# Patient Record
Sex: Male | Born: 1940 | Race: White | Hispanic: No | Smoking: Never smoker
Health system: Southern US, Community
[De-identification: ages and names within clinical notes are randomized; demographics above are authoritative.]

## PROBLEM LIST (undated history)

## (undated) DIAGNOSIS — I10 Essential (primary) hypertension: Secondary | ICD-10-CM

## (undated) DIAGNOSIS — E049 Nontoxic goiter, unspecified: Secondary | ICD-10-CM

## (undated) DIAGNOSIS — N400 Enlarged prostate without lower urinary tract symptoms: Secondary | ICD-10-CM

## (undated) DIAGNOSIS — N529 Male erectile dysfunction, unspecified: Secondary | ICD-10-CM

## (undated) DIAGNOSIS — L309 Dermatitis, unspecified: Secondary | ICD-10-CM

## (undated) DIAGNOSIS — D696 Thrombocytopenia, unspecified: Secondary | ICD-10-CM

## (undated) DIAGNOSIS — R519 Headache, unspecified: Secondary | ICD-10-CM

## (undated) DIAGNOSIS — H33012 Retinal detachment with single break, left eye: Secondary | ICD-10-CM

## (undated) DIAGNOSIS — K219 Gastro-esophageal reflux disease without esophagitis: Secondary | ICD-10-CM

## (undated) DIAGNOSIS — K5790 Diverticulosis of intestine, part unspecified, without perforation or abscess without bleeding: Secondary | ICD-10-CM

## (undated) DIAGNOSIS — I7090 Unspecified atherosclerosis: Secondary | ICD-10-CM

## (undated) DIAGNOSIS — R7989 Other specified abnormal findings of blood chemistry: Secondary | ICD-10-CM

## (undated) DIAGNOSIS — I739 Peripheral vascular disease, unspecified: Secondary | ICD-10-CM

## (undated) DIAGNOSIS — G250 Essential tremor: Secondary | ICD-10-CM

## (undated) DIAGNOSIS — M1990 Unspecified osteoarthritis, unspecified site: Secondary | ICD-10-CM

## (undated) DIAGNOSIS — E785 Hyperlipidemia, unspecified: Secondary | ICD-10-CM

## (undated) DIAGNOSIS — L570 Actinic keratosis: Secondary | ICD-10-CM

## (undated) DIAGNOSIS — E119 Type 2 diabetes mellitus without complications: Secondary | ICD-10-CM

## (undated) DIAGNOSIS — F419 Anxiety disorder, unspecified: Secondary | ICD-10-CM

## (undated) DIAGNOSIS — H409 Unspecified glaucoma: Secondary | ICD-10-CM

## (undated) HISTORY — DX: Unspecified osteoarthritis, unspecified site: M19.90

## (undated) HISTORY — DX: Thrombocytopenia, unspecified: D69.6

## (undated) HISTORY — DX: Nontoxic goiter, unspecified: E04.9

## (undated) HISTORY — DX: Benign prostatic hyperplasia without lower urinary tract symptoms: N40.0

## (undated) HISTORY — DX: Unspecified glaucoma: H40.9

## (undated) HISTORY — DX: Dermatitis, unspecified: L30.9

## (undated) HISTORY — DX: Hyperlipidemia, unspecified: E78.5

## (undated) HISTORY — DX: Unspecified atherosclerosis: I70.90

## (undated) HISTORY — DX: Diverticulosis of intestine, part unspecified, without perforation or abscess without bleeding: K57.90

## (undated) HISTORY — DX: Gastro-esophageal reflux disease without esophagitis: K21.9

## (undated) HISTORY — DX: Essential tremor: G25.0

## (undated) HISTORY — PX: CATARACT EXTRACTION: SUR2

## (undated) HISTORY — PX: PYLOROMYOTOMY: SUR1063

## (undated) HISTORY — PX: SPINAL FUSION: SHX223

## (undated) HISTORY — PX: HEMORRHOIDECTOMY WITH HEMORRHOID BANDING: SHX5633

## (undated) HISTORY — DX: Essential (primary) hypertension: I10

## (undated) HISTORY — PX: PROSTATE SURGERY: SHX751

## (undated) HISTORY — DX: Other specified abnormal findings of blood chemistry: R79.89

## (undated) HISTORY — DX: Male erectile dysfunction, unspecified: N52.9

## (undated) HISTORY — PX: VASECTOMY: SHX75

## (undated) HISTORY — DX: Actinic keratosis: L57.0

## (undated) HISTORY — DX: Type 2 diabetes mellitus without complications: E11.9

## (undated) HISTORY — PX: BIOPSY THYROID: PRO38

## (undated) HISTORY — PX: BACK SURGERY: SHX140

---

## 1994-09-04 HISTORY — PX: RETINAL DETACHMENT SURGERY: SHX105

## 1995-09-05 HISTORY — PX: CATARACT EXTRACTION: SUR2

## 1997-09-04 HISTORY — PX: OTHER SURGICAL HISTORY: SHX169

## 1997-12-09 ENCOUNTER — Other Ambulatory Visit: Admission: RE | Admit: 1997-12-09 | Discharge: 1997-12-09 | Payer: Self-pay | Admitting: Family Medicine

## 1998-01-13 ENCOUNTER — Ambulatory Visit (HOSPITAL_COMMUNITY): Admission: RE | Admit: 1998-01-13 | Discharge: 1998-01-14 | Payer: Self-pay | Admitting: Neurosurgery

## 1998-01-28 ENCOUNTER — Other Ambulatory Visit: Admission: RE | Admit: 1998-01-28 | Discharge: 1998-01-28 | Payer: Self-pay | Admitting: Family Medicine

## 1998-03-01 ENCOUNTER — Other Ambulatory Visit: Admission: RE | Admit: 1998-03-01 | Discharge: 1998-03-01 | Payer: Self-pay | Admitting: Family Medicine

## 1998-03-03 ENCOUNTER — Ambulatory Visit (HOSPITAL_COMMUNITY): Admission: RE | Admit: 1998-03-03 | Discharge: 1998-03-03 | Payer: Self-pay | Admitting: Neurosurgery

## 1999-09-05 HISTORY — PX: OTHER SURGICAL HISTORY: SHX169

## 2001-09-04 HISTORY — PX: OTHER SURGICAL HISTORY: SHX169

## 2002-09-01 ENCOUNTER — Encounter: Payer: Self-pay | Admitting: Neurosurgery

## 2002-09-03 ENCOUNTER — Inpatient Hospital Stay (HOSPITAL_COMMUNITY): Admission: RE | Admit: 2002-09-03 | Discharge: 2002-09-08 | Payer: Self-pay | Admitting: Neurosurgery

## 2002-09-03 ENCOUNTER — Encounter: Payer: Self-pay | Admitting: Neurosurgery

## 2002-10-02 ENCOUNTER — Encounter: Payer: Self-pay | Admitting: Neurosurgery

## 2002-10-02 ENCOUNTER — Encounter: Admission: RE | Admit: 2002-10-02 | Discharge: 2002-10-02 | Payer: Self-pay | Admitting: Neurosurgery

## 2002-12-09 ENCOUNTER — Encounter: Admission: RE | Admit: 2002-12-09 | Discharge: 2002-12-09 | Payer: Self-pay | Admitting: Neurosurgery

## 2002-12-09 ENCOUNTER — Encounter: Payer: Self-pay | Admitting: Neurosurgery

## 2003-09-05 HISTORY — PX: KNEE SURGERY: SHX244

## 2003-10-05 ENCOUNTER — Inpatient Hospital Stay (HOSPITAL_COMMUNITY): Admission: RE | Admit: 2003-10-05 | Discharge: 2003-10-12 | Payer: Self-pay | Admitting: Orthopedic Surgery

## 2004-11-01 ENCOUNTER — Encounter: Admission: RE | Admit: 2004-11-01 | Discharge: 2004-11-01 | Payer: Self-pay | Admitting: Rheumatology

## 2006-09-04 HISTORY — PX: OTHER SURGICAL HISTORY: SHX169

## 2006-10-17 ENCOUNTER — Emergency Department (HOSPITAL_COMMUNITY): Admission: EM | Admit: 2006-10-17 | Discharge: 2006-10-17 | Payer: Self-pay | Admitting: Emergency Medicine

## 2006-10-27 ENCOUNTER — Encounter: Admission: RE | Admit: 2006-10-27 | Discharge: 2006-10-27 | Payer: Self-pay | Admitting: Sports Medicine

## 2009-06-17 ENCOUNTER — Encounter: Admission: RE | Admit: 2009-06-17 | Discharge: 2009-06-17 | Payer: Self-pay | Admitting: Family Medicine

## 2010-09-04 HISTORY — PX: OTHER SURGICAL HISTORY: SHX169

## 2011-01-20 NOTE — Discharge Summary (Signed)
   [name], Miguel Miller                         ACCOUNT NO.:  [id number]   MEDICAL RECORD NO.:  [id number]                   PATIENT TYPE:  INP   LOCATION:  3041                                 FACILITY:  [facility]   PHYSICIAN:  Kathaleen Maser. Pool, M.D.                 DATE OF BIRTH:  September 07, 1940   DATE OF ADMISSION:  09/03/2002  DATE OF DISCHARGE:  09/08/2002                                 DISCHARGE SUMMARY   FINAL DIAGNOSES:  1. 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  2. L4-5 spondylolisthesis, degenerative with stenosis.   OPERATIONS AND TREATMENTS:  L3-4 and L4-5 decompression and fusion surgery  with instrumentation.   HISTORY OF PRESENT ILLNESS:  The patient is a 70 year old male with history  of back and bilateral lower extremity pain.  He had weakness which was  progressively worsening.  MRI scanning demonstrates severe stenosis at L3-4  and L4-5.  The patient presents now for two-level decompression and fusion  surgery.   HOSPITAL COURSE:  The patient was taken to the operating room where an  uncomplicated L3-4 and L4-5 decompression and fusion surgery was performed  with instrumentation.  Postoperatively, the patient was doing quite well.  Lower extremity pain completely resolved  Back pain was reasonably marked  but expected.  The patient was slowly mobilized with time.  He was mobilized  using the assistance of physical therapy and occupational therapy.  He  stated his lower extremity function felt better.  His wound is healing well.  Postoperative drain was later removed.   At the time of discharge, the patient was tolerating a regular diet.  Bowel  function was normal.  He is to be discharged to home.   CONDITION ON DISCHARGE:  Improved.   DISPOSITION:  The patient will follow up in my office in one week.                                               [name], M.D.    HAP/MEDQ  D:  10/01/2002  T:  10/01/2002  Job:  [id number]

## 2011-01-20 NOTE — Discharge Summary (Signed)
NAMEHODGES, TREIBER                         ACCOUNT NO.:  [id number]   MEDICAL RECORD NO.:  [id number]                   PATIENT TYPE:  INP   LOCATION:  3762                                 FACILITY:  [facility]   PHYSICIAN:  Harvie Junior, M.D.                DATE OF BIRTH:  November 06, 1940   DATE OF ADMISSION:  10/05/2003  DATE OF DISCHARGE:  10/12/2003                                 DISCHARGE SUMMARY   ADMISSION DIAGNOSES:  1. End-stage degenerative joint disease, right knee.  2. End-stage degenerative joint disease, left knee.  3. Type II diabetes mellitus.  4. Hypertension.  5. Moderate obesity.   DISCHARGE DIAGNOSES:  1. End-stage degenerative joint disease, right knee.  2. End-stage degenerative joint disease, left knee.  3. Type II diabetes mellitus.  4. Hypertension.  5. Moderate obesity.  6. Mild elevation of liver function studies, questionable etiology.   PROCEDURES IN HOSPITAL:  1. Left total knee arthroplasty - Jodi Geralds, M.D. - October 05, 2003.  2. Right total knee arthroplasty - Feliberto Gottron. Turner Daniels, M.D. - October 05, 2003.   BRIEF HISTORY:  Mr. Weakland is a 70 year old male well-known to [place], who has  had bilateral knee pain with ambulation and night pain.  Standing x-rays of  both knees showed severe bone-on-bone degenerative joint disease.  He got no  significant relief with nonoperative treatment including cortisone  injection, modification of his activities, physical therapy including  exercises and use of anti-inflammatory medications.  Because of his  continued clinical findings and radiographic findings, he was admitted for  bilateral total knee arthroplasty.   PERTINENT LABORATORY STUDIES:  The patient's preoperative chest x-ray showed  no evidence of active chest disease.  Hemoglobin on admission was 15.1,  hematocrit 45.5, indices within normal limits.  On postop day number one his  hemoglobin was 11.8, on postop day number two 11.1, postop day number  three  10.4, postop days number four 11.1.  Protime on admission was 13.7 seconds  with an INR of 1.1 and a PTT of 27.  On date of discharge his protime was  21.5 seconds with an INR of 2.5.  CMET on admission was within normal limits  other than slightly elevated AST and elevated ALT at 42 and 58.  Urinalysis  showed no abnormalities.   HOSPITAL COURSE:  The patient underwent surgery as well described in Dr.  Luiz Blare' and Dr. Wadie Lessen operative notes on October 05, 2003.  He had  bilateral total knee arthroplasties done.  Postoperatively the Autovac  drains were used to decrease blood loss and allow retransfusion of drainage  from his Hemovac drains.  He was put on a PCA morphine pump for pain  control.  IV Ancef one gram q.8h times six doses.  CPM machine is used for  both knees for range of motion.  On postop day number one he had bilateral  moderate knee pain.  He was taking fluids without difficulty.  His vital  signs were stable.  He was afebrile.  The patient was alert and oriented.  His hemoglobin was 11.1, INR was 1.4 and potassium was 3.9.  Physical  therapy got him out of bed to the chair and weight-bearing as tolerated  bilaterally with knee immobilizers.  Postop day number two his PCA morphine  pump was discontinued.  The Foley catheter was continued times one more day.  His knee dressings were changed and his Hemovac drains were pulled.  On  postop day number three the patient had a hemoglobin of 10.4, CBGs were 182,  his protime was 18.2 seconds.  He was continued on physical therapy and CPM  machine and use of Percocet for pain.  On postop day number four he had some  complaints of slight heartburn which he had had in the past, no significant  chest pain noted.  His temperature was 101.0.  His pulse was elevated at  101.  Blood pressure 130/84.  His bilateral knee wounds were benign.  His  calves were soft.  No signs of DVT.  His INR was therapeutic.  The patient  continued  to make good progress with physical therapy.  INR was 2.1.  He  needed a little more physical therapy prior to discharge to be discharged  safely.  On postop day number seven he stated he was ready to go home.  He  was taking fluids and voiding without difficulty and making good progress  with PT.  His vital signs were stable and he was afebrile.  His knee wounds  were benign.  His calves were soft.  His INR was 2.5.   CONDITION ON DISCHARGE:  He was discharged home in improved condition.   DIET:  Regular diet.   DISCHARGE MEDICATIONS:  He was given Rx for OxyContin 40 mg b.i.d. and Rx  for Percocet 5 mg for breakthrough pain.  Coumadin 5 mg #30, one daily  unless otherwise directed x1 month postop.   FOLLOW UP:  He will have home health physical therapy three times a week and  CPM machine at home, zero degrees to 65 degrees increasing 5 degrees daily  up to 100 degrees flexion.  He will follow up with Dr. Luiz Blare in one week in  the office.  To call sooner if any problems occur.      Marshia Ly, P.A.                       Harvie Junior, M.D.    Cordelia Pen  D:  12/11/2003  T:  12/12/2003  Job:  [id number]   cc:   Thelma Barge P. Modesto Charon, M.D.  [address]  [place]  [place] [address]  Fax: [phone]

## 2011-01-20 NOTE — Op Note (Signed)
NAMEBRENNER, VISCONTI                         ACCOUNT NO.:  [id number]   MEDICAL RECORD NO.:  [id number]                   PATIENT TYPE:  INP   LOCATION:  2899                                 FACILITY:  [facility]   PHYSICIAN:  Feliberto Gottron. Turner Daniels, M.D.                DATE OF BIRTH:  09-Jul-1941   DATE OF PROCEDURE:  10/05/2003  DATE OF DISCHARGE:                                 OPERATIVE REPORT   PREOPERATIVE DIAGNOSIS:  Degenerative arthritis of both knees.   POSTOPERATIVE DIAGNOSIS:  Degenerative arthritis of both knees.   PROCEDURE:  Bilateral  total knee arthroplasties. Dr. Luiz Blare is the primary  surgeon on the left side; I was primary surgeon on the right side. I will be  dictating the right total knee note only. My procedure is right  total knee  arthroplasty using DePuy Sigma components with a #4 tibia, a #4 femur, a 38-  mm patella and a 10 stabilized bearing.   ESTIMATED BLOOD LOSS:  Minimal.   FLUIDS REPLACED:  1500 mL Crystalloid.   TOURNIQUET TIME:  1 hour and 25 minutes.   DRAINS:  Two medium Hemovacs and a Foley catheter.   INDICATIONS FOR PROCEDURE:  The patient is a 70 year old gentleman with end-  stage arthritis of both knees and varus deformities who has failed all sorts  of conservative treatment including  knee arthroscopy and cortisone  injections and now desires bilateral  total knee arthroplasties and is well  aware of the risks and benefits of surgery.   DESCRIPTION OF PROCEDURE:  The patient was brought to the operating room  where he underwent  standard general endotracheal anesthesia, application of  bilateral tourniquets as well as foot positioners and lateral  posts to both  sides of the table. Both lower extremities were then prepped and draped in  the usual sterile fashion and Dr. Luiz Blare performed the primary  total knee  arthroplasty on the left side. As this was being completed, we went ahead  and started the total knee replacement on the right side.  The right lower  extremity was wrapped with an Esmarch bandage. The tourniquet was inflated  to 350 mmHg.   An anterior midline incision was made from 1 handbreadth above the patella  to 1 handbreadth below the patella and just  medial to the tibial tubercle  through the skin and subcutaneous tissue. A medial peripatellar arthrotomy  was then accomplished. The patella was everted. The prepatellar fat pad and  anterior horns of the menisci were resected. The superficial medial  collateral ligament was then elevated from anterior to posterior  but left  intact distally on the tibia, exposing the proximal  tibia.   An osteotome was then used to remove peripheral as well as notch osteophytes  and resect the ACL and PCL. The external alignment guide was then positioned  with a 0 degree posterior  slope cutting guide which was  pinned into place,  allowing resection of 4 to 5 mm of bone medially  and about 8 to  9 mm of  bone laterally. The posterior structures were protected with a posteromedial  Z-retractor, a McHale in the notch and a lateral  Personal assistant.   Satisfied with the proximal tibial resection, the distal femur was then  entered with the step drill followed  by an IM rod, and a 5 degree right 10-  mm distal femoral cutting was pinned  into place and the distal femoral cut  accomplished. We then sized for a #4 right femoral component, placed the  pins in 3 degrees of external rotation and the  chamfer guide  was then  hammered into place. The anterior and posterior  chamfer cuts were  accomplished without difficulty. The box cutting guide was then pinned into  place and the box cut accomplished without difficulty.   Peripheral osteophytes were removed, as were some posterior superior  osteophytes from the femur. The everted patella was then grasped with the  patellar cutting guide, measured to 14 mm for the resection, and the  posterior 9 to 10 mm of the patella resected,  sized for a 38-mm patellar  button and drilled. We then hammered into place a #4 right femoral  component, placed the 38-mm trial button and a 4 tibial base plate with a 10  spacer was then placed and found to have good flexion and extension gaps.   The knee was taken through a range of motion and patellar tracking was noted  to be excellent. The trial components were then removed. The tibial base  plate was then centered  on the tibia with the knee hyperflexed and pinned,  followed  by the proximal  tibial reamer, and the pin punch. A trial was  again performed and excellent stability was noted.   At this point the trial components were removed. All bony surfaces were  water picked clean and dried with suction and sponges. A double  batch of  DePuy polymethyl methacrylate cement was mixed with 1500 mg of Zinacef  powder followed  by the monomer  and then applied to any  bony and metallic  mating surfaces with the exception of the posterior condyles of the femur.  In order we then  hammered into place the tibial base plate and removed  excess cement. The 4 right femoral component was hammered into place and  excess cement was removed. The patellar button was then  squeezed into place  with the clamp and the 10-mm stabilized bearing was then hammered onto the  tibial component and the knee reduced.   The knee was taken through a range of motion. Once again excellent stability  was noted. Hemovac drains were placed deep in the wound which was once again  water picked clean. The peripatellar arthrotomy was then closed with a  running #1 Vicryl suture. The subcutaneous tissue with 0 and 2-0 undyed  Vicryl suture and the skin with skin staples. A dressing of Xeroform, 4 x 4  dressing  sponges, Webril and an Ace wrap were applied. An Autovac drain was  hooked up to the Hemovacs.  The tourniquet was let down. The patient was awakened and taken to the  recovery room without difficulty.  Feliberto Gottron. Turner Daniels, M.D.    Ovid Curd  D:  10/05/2003  T:  10/05/2003  Job:  [id number]

## 2011-01-20 NOTE — Op Note (Signed)
Miguel Miller, Miguel Miller                         ACCOUNT NO.:  [id number]   MEDICAL RECORD NO.:  [id number]                   PATIENT TYPE:  INP   LOCATION:  3041                                 FACILITY:  [facility]   PHYSICIAN:  Kathaleen Maser. Pool, M.D.                 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   PROCEDURES:  1. L3-4 decompressive laminectomy with foraminotomies and resection of     synovial cyst.  2. L4-5 decompressive laminectomy with foraminotomies.  3. L3-4 and L4-5 posterior lumbar interbody fusion utilizing Tangent wedges     and local autograft.  4. L3 through L5 posterolateral fusion utilizing pedicle screw     instrumentation and local autograft.   SURGEON:  Kathaleen Maser. Pool, M.D.   ASSISTANT:  Reinaldo Meeker, M.D.   ANESTHESIA:  General endotracheal.   INDICATIONS:  The patient is a 70 year old male with a history of back and  bilateral lower extremity pain, paresthesias, and weakness which have  progressively worsened.  The patient has near-complete foot drop  bilaterally.  He is beginning to have some early urinary symptoms.  MRI  scanning demonstrates a large rightward L3-4 synovial cyst with hemorrhage  and severe spinal stenosis.  At L4-5 the patient has severe spinal stenosis  with a grade 1 degenerative spondylolisthesis.  We have discussed options  available for management.  The patient has gone for cardiac workup, which  shows that he is an acceptable cardiac risk.  He presents now for L3-4 and  L4-5 decompression and fusion surgery.   DESCRIPTION OF PROCEDURE:  The patient was taken to the operating room and  placed on the table  in supine position.  After an adequate level of  anesthesia was achieved, the patient was positioned prone onto a Wilson  frame and appropriately padded.  The patient's lumbar region was prepped and  draped sterilely.  A 10 blade was used to make a linear skin incision  extending from L2 down to L5.  This was carried down sharply in the midline.  Subperiosteal dissection then performed exposing the laminae and facet  joints at L2, ,L3, L4, and L5 as well as the transverse processes of L3,  L4, and L5.  A deep self-retaining retractor was placed.  Intraoperative  fluoroscopy was used.  The level was confirmed.  Decompressive laminectomy  was then performed at L3, L4, and L5 using Leksell rongeurs, Kerrison  rongeurs, and the high-speed drill to remove the entire lamina of L3, the  entire lamina of L4, and the superior aspect of the lamina of L5.  All bone  was  cleaned and used in later autografting.  Complete inferior facetectomy  was then performed bilaterally at L3 and L4 and complete superior  facetectomies were performed bilaterally at L4 and L5.  A wide foraminotomy  was performed along the course of the exiting nerve roots.  The ligamentum  was then elevated and resected in piecemeal fashion using Kerrison rongeurs.  A large rightward L3-4 synovial cyst was encountered.  This was dissected  free using microdissection.  This was gently peeled off the thecal sac and  nerve roots.  This was completely resected.  There was no evidence of  malignancy.  The wound was then irrigated with antibiotic solution.  The  disk spaces at L3-4 and L4-5 were then prepared.  Starting first at L3-4  with nerve roots and thecal sac protected,  the disk space was incised with  a 15 blade on the left side.  A wide disk space clean-out was then achieved  using pituitary rongeurs, upward-angled pituitary rongeurs, and Epstein  curettes.  The procedure was then repeated on the contralateral side and  then  repeated bilaterally at L4-5.  Tangent wedges were then placed at L3-4  by first reaming the interspace and then cutting with a 12 mm Tangent  chisel.  Soft tissue was removed from the interspace.  A 12 x 26 mm Tangent  wedge was impacted first in the left side.  A distractor was removed and the  procedure was then repeated on the right side.  Prior to installation of the  second wedge, morcellized autograft was packed into the interspace.  The  second wedge was also impacted into place, recessed approximately 2 mm from  the posterior cortical margin.  The procedure was then repeated bilaterally  at L4-5 with 10 x 26 mm Tangent wedges.  All four wedges were found to be  well-positioned by intraoperative fluoroscopy.  Pedicles at L3, L4,  and L5  were then isolated using surface landmarks and intraoperative fluoroscopy.  Superficial bone was removed overlying the pedicles at all three levels.  Each pedicle was then probed using a pedicle awl.  Each pedicle awl track  was then tapped with a 5.25 mm screw tap.  Each screw tap hole was then  probed and found to be solidly within bone.  Spiral 90 6.75 x 45 mm screws  were placed bilaterally at L3, 6.75 x 40 mm screws placed bilaterally at L4,  and 6.75 x 35 mm screws were placed bilaterally at L5.  All six screws were  given a final tightening and found to be solidly within bone.  Transverse  processes of L3, L4, and L5 were then decorticated using the high-speed  drill.  Morcellized autograft was packed posterolaterally for later fusion.  A titanium rod was then contoured and placed over the screw heads at L3, L4,  and L5.  The locking caps were then placed over the screw heads.  The  locking caps were then engaged in a sequential fashion with the screw heads  under compression.  A transverse connector was placed.  Final images  revealed good position of bone grafts, hardware, proper operative level, with normal alignment of the spine.  A blunt  probe was passed easily out  each neural foramen.  There was no evidence of injury to thecal sac or nerve  roots.  Gelfoam was placed topically for hemostasis, which was found to be  good.  A medium Hemovac drain was left in the epidural space.  The wound  was  then closed in layers with Vicryl sutures.  Steri-Strips and a sterile  dressing were applied.  There were no apparent complications.  The patient  tolerated the procedure well, and he returns to the recovery room postop.                                               [name], M.D.    HAP/MEDQ  D:  09/03/2002  T:  09/04/2002  Job:  [id number]

## 2011-01-20 NOTE — Op Note (Signed)
Miguel Miller, Miguel Miller                         ACCOUNT NO.:  [id number]   MEDICAL RECORD NO.:  [id number]                   PATIENT TYPE:  INP   LOCATION:  2106                                 FACILITY:  [facility]   PHYSICIAN:  Harvie Junior, M.D.                DATE OF BIRTH:  [birth year]   DATE OF PROCEDURE:  10/05/2003  DATE OF DISCHARGE:                                 OPERATIVE REPORT   PREOPERATIVE DIAGNOSIS:  Degenerative disk disease, left knee.   POSTOPERATIVE DIAGNOSIS:  Degenerative disk disease, left knee.   OPERATION PERFORMED:  Left total knee replacement with Laural Ben[employer]  Sigma knee system size 4 femur, size 4 tibia, 10 mm bridging bearing, 38 mm  all poly patella.   SURGEON:  Harvie Junior, M.D.   ASSISTANT:  1. Feliberto Gottron. Turner Daniels, M.D.  2. Marshia Ly, P.A.   ANESTHESIA:  General.   INDICATIONS FOR PROCEDURE:  The patient is a 70 year old male with a long  history of having significant bilateral degenerative joint disease of the  knee and was evaluated greater than a year ago.  We discussed the  possibility of bilateral knee replacement.  He had been worked up for this  and actually was presenting for the surgery and was noted at that time to  have some significant lower extremity weakness and numbness, ended up  presenting for back surgery based on this and his surgery was delayed.  Because of continued complaints of pain, he was followed and almost a year  later, he came back with the same problems and presented for the same  procedure.  We discussed the risks and benefits of the surgery including the  possibility of significant blood loss.  He did not wish to give autologous  preoperative blood donation.  We had further discussion of the increased  risks of mortality associated with surgery and although mortality associated  with a unilateral knee replacement is low, the mortality associated with  bilateral is about three times as high.  He is  understanding of this and did  wish to proceed with bilateral knee replacements and is brought to the  operating room for this procedure.   DESCRIPTION OF PROCEDURE:  The patient was brought to the operating room.  After adequate anesthesia was obtained with a general anesthetic, the  patient was placed supine on the operating table.  Both legs were then  prepped and draped in the usual sterile fashion.  Following this, the left  leg was exsanguinated.  A blood pressure tourniquet was inflated to 350  mmHg.  Following this, a midline incision was made and subcutaneous tissue  dissected down to the level of the extensor mechanism.  Medial parapatellar  arthrotomy was undertaken, the knee was then exposed and medial and lateral  meniscectomy were then performed as well as anterior and posterior cruciate  excision as well as patellar fat pad excision.  At this point the tibia was  cut perpendicular to its long axis and the entire tibial area was exposed  and identified.  Attention was turned to the femur which was sized and the  distal femoral cut was made with a 10 degree excision distally.  Once this  was done, the femur was sized to a size 4.  Excellent sizing was achieved at  this point.  When this was achieved, the alignment was then put in place.  This was perfectly perpendicular to the epicondylar axis and then attention  was then turned towards the four in one cutting guide which was used to cut  the anterior and posterior cut as well as the chamfers.  Once this was done,  the box cut was made with the jig and following this, attention was then  turned towards the flexion gap.  The flexion gap was checked to match the  extension gap which ___________ at 10 mm.  The flexion gap was checked at  this point and the box was then drilled and attention was then turned  towards the trialing of the tibia.  This was incised to a size 4 and the  pegs put in place.  The lugs were then keeled.   This then was cemented into  place.  Attention was then turned towards the trial where the trial femur  was put in place and a 10 mm bridging bearing.  Excellent range of motion  was achieved with midline patellar tracking.  Attention was then turned to  the patella where 9 mm of patella was resected for 38 mm patella and the  lugs were drilled.  The patella was put in place.  Range of motion was  undertaken at this point.  Excellent range of motion was achieved.  No  tendency towards subluxation or problems.  At this point the knee was  copiously irrigated and suctioned dry.  All components were removed and  pulsatile lavage irrigation was used to cleanse all the bone surfaces. At  this point the trial components were then cemented into place and the excess  cement was removed with Glorious Peach elevators under direct vision.  10 mm bridging  bearing was put in place at this point and attention was turned to the  patella which was cemented into place.  All excess cement was removed.  At  this point the knee was then held in 10 degrees of flexion with force  proximally.  The medial parapatellar arthrotomy was then closed with a 1  Vicryl running suture after a medium Hemovac drain had been put in place.  The subcu was closed with 0 and 2-0 Vicryl and the skin with skin staples.  Excellent range of motion was achieved prior to the medial parapatellar  arthrotomy closure.  At this point easy gravity flexion of 90 degrees was  obtained.  The patient's posterior soft tissue really blocked further  flexion past really about 100 degrees.  At this point the Hemovac was hooked  up to an Autovac.  The anterior wound was then closed with staples.  Sterile  compressive dressing was then applied as well as a bandage and knee  immobilizer.  At this point attention was turned towards the right knee  where a similar procedure was going to be performed by Dr. Turner Daniels.  I assisted throughout the case.  During my  closure of the left knee, Dr. Turner Daniels  began the right knee and the cases went simultaneously near 45 minutes.  This was the need for having two surgeons in the room.  I assisted  throughout on the second knee and he assisted me on the first knee.                                               Harvie Junior, M.D.    Ranae Plumber  D:  10/05/2003  T:  10/06/2003  Job:  [id number]

## 2011-02-01 ENCOUNTER — Other Ambulatory Visit: Payer: Self-pay | Admitting: Family Medicine

## 2011-02-01 DIAGNOSIS — M545 Low back pain, unspecified: Secondary | ICD-10-CM

## 2011-02-14 ENCOUNTER — Ambulatory Visit
Admission: RE | Admit: 2011-02-14 | Discharge: 2011-02-14 | Disposition: A | Discharge status: Home / Self Care | Payer: Medicare Other | Source: Ambulatory Visit | Attending: Family Medicine | Admitting: Family Medicine

## 2011-02-14 DIAGNOSIS — M545 Low back pain, unspecified: Secondary | ICD-10-CM

## 2011-03-13 ENCOUNTER — Other Ambulatory Visit (HOSPITAL_COMMUNITY): Payer: Self-pay | Admitting: Neurosurgery

## 2011-03-13 ENCOUNTER — Ambulatory Visit (HOSPITAL_COMMUNITY)
Admission: RE | Admit: 2011-03-13 | Discharge: 2011-03-13 | Disposition: A | Discharge status: Home / Self Care | Payer: Medicare Other | Source: Ambulatory Visit | Attending: Neurosurgery | Admitting: Neurosurgery

## 2011-03-13 ENCOUNTER — Encounter (HOSPITAL_COMMUNITY)
Admission: RE | Admit: 2011-03-13 | Discharge: 2011-03-13 | Disposition: A | Discharge status: Home / Self Care | Payer: Medicare Other | Source: Ambulatory Visit | Attending: Neurosurgery | Admitting: Neurosurgery

## 2011-03-13 DIAGNOSIS — M5126 Other intervertebral disc displacement, lumbar region: Secondary | ICD-10-CM | POA: Insufficient documentation

## 2011-03-13 DIAGNOSIS — M48061 Spinal stenosis, lumbar region without neurogenic claudication: Secondary | ICD-10-CM | POA: Insufficient documentation

## 2011-03-13 DIAGNOSIS — Z01818 Encounter for other preprocedural examination: Secondary | ICD-10-CM | POA: Insufficient documentation

## 2011-03-13 DIAGNOSIS — Z01810 Encounter for preprocedural cardiovascular examination: Secondary | ICD-10-CM | POA: Insufficient documentation

## 2011-03-13 DIAGNOSIS — Z01812 Encounter for preprocedural laboratory examination: Secondary | ICD-10-CM | POA: Insufficient documentation

## 2011-03-13 LAB — CBC
HCT: 49.6 % (ref 39.0–52.0)
Hemoglobin: 16.6 g/dL (ref 13.0–17.0)
MCH: 30.3 pg (ref 26.0–34.0)
MCHC: 33.5 g/dL (ref 30.0–36.0)
MCV: 90.5 fL (ref 78.0–100.0)
Platelets: 145 10*3/uL — ABNORMAL LOW (ref 150–400)
RBC: 5.48 MIL/uL (ref 4.22–5.81)
RDW: 13.6 % (ref 11.5–15.5)
WBC: 6.2 10*3/uL (ref 4.0–10.5)

## 2011-03-13 LAB — BASIC METABOLIC PANEL
BUN: 19 mg/dL (ref 6–23)
CO2: 31 mEq/L (ref 19–32)
Calcium: 9.5 mg/dL (ref 8.4–10.5)
Chloride: 99 mEq/L (ref 96–112)
Creatinine, Ser: 1.07 mg/dL (ref 0.50–1.35)
GFR calc Af Amer: >60 mL/min (ref >60)
GFR calc non Af Amer: >60 mL/min (ref >60)
Glucose, Bld: 203 mg/dL — ABNORMAL HIGH (ref 70–99)
Potassium: 5.3 mEq/L — ABNORMAL HIGH (ref 3.5–5.1)
Sodium: 138 mEq/L (ref 135–145)

## 2011-03-13 LAB — SURGICAL PCR SCREEN
MRSA, PCR: NEGATIVE
Staphylococcus aureus: NEGATIVE

## 2011-03-24 ENCOUNTER — Inpatient Hospital Stay (HOSPITAL_COMMUNITY)
Admission: RE | Admit: 2011-03-24 | Discharge: 2011-03-28 | DRG: 460 | Disposition: A | Discharge status: Home / Self Care | Payer: Medicare Other | Source: Ambulatory Visit | Attending: Neurosurgery | Admitting: Neurosurgery

## 2011-03-24 ENCOUNTER — Inpatient Hospital Stay (HOSPITAL_COMMUNITY): Payer: Medicare Other

## 2011-03-24 DIAGNOSIS — E669 Obesity, unspecified: Secondary | ICD-10-CM | POA: Diagnosis present

## 2011-03-24 DIAGNOSIS — I10 Essential (primary) hypertension: Secondary | ICD-10-CM | POA: Diagnosis present

## 2011-03-24 DIAGNOSIS — M48061 Spinal stenosis, lumbar region without neurogenic claudication: Principal | ICD-10-CM | POA: Diagnosis present

## 2011-03-24 DIAGNOSIS — G4733 Obstructive sleep apnea (adult) (pediatric): Secondary | ICD-10-CM | POA: Diagnosis present

## 2011-03-24 DIAGNOSIS — E119 Type 2 diabetes mellitus without complications: Secondary | ICD-10-CM | POA: Diagnosis present

## 2011-03-24 DIAGNOSIS — R339 Retention of urine, unspecified: Secondary | ICD-10-CM | POA: Diagnosis not present

## 2011-03-24 DIAGNOSIS — Z981 Arthrodesis status: Secondary | ICD-10-CM

## 2011-03-24 DIAGNOSIS — Z23 Encounter for immunization: Secondary | ICD-10-CM

## 2011-03-24 LAB — TYPE AND SCREEN
ABO/RH(D): A POS
Antibody Screen: NEGATIVE

## 2011-03-24 LAB — DIFFERENTIAL
Basophils Absolute: 0 10*3/uL (ref 0.0–0.1)
Basophils Relative: 0 % (ref 0–1)
Eosinophils Absolute: 0.1 10*3/uL (ref 0.0–0.7)
Eosinophils Relative: 1 % (ref 0–5)
Lymphocytes Relative: 38 % (ref 12–46)
Lymphs Abs: 2.5 10*3/uL (ref 0.7–4.0)
Monocytes Absolute: 0.7 10*3/uL (ref 0.1–1.0)
Monocytes Relative: 10 % (ref 3–12)
Neutro Abs: 3.3 10*3/uL (ref 1.7–7.7)
Neutrophils Relative %: 50 % (ref 43–77)

## 2011-03-24 LAB — GLUCOSE, CAPILLARY
Glucose-Capillary: 99 mg/dL (ref 70–99)
Glucose-Capillary: 157 mg/dL — ABNORMAL HIGH (ref 70–99)

## 2011-03-24 LAB — ABO/RH: ABO/RH(D): A POS

## 2011-03-25 LAB — CBC
HCT: 45.5 % (ref 39.0–52.0)
Hemoglobin: 15.5 g/dL (ref 13.0–17.0)
MCH: 30.2 pg (ref 26.0–34.0)
MCHC: 34.1 g/dL (ref 30.0–36.0)
MCV: 88.7 fL (ref 78.0–100.0)
Platelets: 140 10*3/uL — ABNORMAL LOW (ref 150–400)
RBC: 5.13 MIL/uL (ref 4.22–5.81)
RDW: 14 % (ref 11.5–15.5)
WBC: 8.9 10*3/uL (ref 4.0–10.5)

## 2011-03-25 LAB — GLUCOSE, CAPILLARY
Glucose-Capillary: 178 mg/dL — ABNORMAL HIGH (ref 70–99)
Glucose-Capillary: 152 mg/dL — ABNORMAL HIGH (ref 70–99)
Glucose-Capillary: 172 mg/dL — ABNORMAL HIGH (ref 70–99)
Glucose-Capillary: 156 mg/dL — ABNORMAL HIGH (ref 70–99)

## 2011-03-25 LAB — BASIC METABOLIC PANEL
BUN: 25 mg/dL — ABNORMAL HIGH (ref 6–23)
CO2: 28 mEq/L (ref 19–32)
Calcium: 8.7 mg/dL (ref 8.4–10.5)
Chloride: 95 mEq/L — ABNORMAL LOW (ref 96–112)
Creatinine, Ser: 1.21 mg/dL (ref 0.50–1.35)
GFR calc Af Amer: >60 mL/min (ref >60)
GFR calc non Af Amer: 60 mL/min — ABNORMAL LOW (ref >60)
Glucose, Bld: 163 mg/dL — ABNORMAL HIGH (ref 70–99)
Potassium: 4.6 mEq/L (ref 3.5–5.1)
Sodium: 135 mEq/L (ref 135–145)

## 2011-03-25 LAB — CARDIAC PANEL(CRET KIN+CKTOT+MB+TROPI)
CK, MB: 7.2 ng/mL (ref 0.3–4.0)
CK, MB: 5.7 ng/mL — ABNORMAL HIGH (ref 0.3–4.0)
Relative Index: 0.2 (ref 0.0–2.5)
Relative Index: 0.1 (ref 0.0–2.5)
Total CK: 4569 U/L — ABNORMAL HIGH (ref 7–232)
Total CK: 3974 U/L — ABNORMAL HIGH (ref 7–232)
Troponin I: <0.3 ng/mL (ref <0.30)
Troponin I: <0.3 ng/mL (ref <0.30)

## 2011-03-26 ENCOUNTER — Inpatient Hospital Stay (HOSPITAL_COMMUNITY): Payer: Medicare Other

## 2011-03-26 LAB — URINALYSIS, ROUTINE W REFLEX MICROSCOPIC
Bilirubin Urine: NEGATIVE
Glucose, UA: >1000 mg/dL — AB
Ketones, ur: 40 mg/dL — AB
Leukocytes, UA: NEGATIVE
Nitrite: NEGATIVE
Protein, ur: 30 mg/dL — AB
Specific Gravity, Urine: 1.027 (ref 1.005–1.030)
Urobilinogen, UA: 0.2 mg/dL (ref 0.0–1.0)
pH: 5.5 (ref 5.0–8.0)

## 2011-03-26 LAB — CARDIAC PANEL(CRET KIN+CKTOT+MB+TROPI)
CK, MB: 3.7 ng/mL (ref 0.3–4.0)
CK, MB: 3.5 ng/mL (ref 0.3–4.0)
Relative Index: 0.1 (ref 0.0–2.5)
Relative Index: 0.2 (ref 0.0–2.5)
Total CK: 3048 U/L — ABNORMAL HIGH (ref 7–232)
Total CK: 2147 U/L — ABNORMAL HIGH (ref 7–232)
Troponin I: <0.3 ng/mL (ref <0.30)
Troponin I: <0.3 ng/mL (ref <0.30)

## 2011-03-26 LAB — GLUCOSE, CAPILLARY
Glucose-Capillary: 152 mg/dL — ABNORMAL HIGH (ref 70–99)
Glucose-Capillary: 171 mg/dL — ABNORMAL HIGH (ref 70–99)
Glucose-Capillary: 178 mg/dL — ABNORMAL HIGH (ref 70–99)
Glucose-Capillary: 197 mg/dL — ABNORMAL HIGH (ref 70–99)

## 2011-03-26 LAB — URINE MICROSCOPIC-ADD ON

## 2011-03-27 LAB — CBC
HCT: 43.5 % (ref 39.0–52.0)
Hemoglobin: 14.5 g/dL (ref 13.0–17.0)
MCH: 29.9 pg (ref 26.0–34.0)
MCHC: 33.3 g/dL (ref 30.0–36.0)
MCV: 89.7 fL (ref 78.0–100.0)
Platelets: 143 10*3/uL — ABNORMAL LOW (ref 150–400)
RBC: 4.85 MIL/uL (ref 4.22–5.81)
RDW: 13.6 % (ref 11.5–15.5)
WBC: 8.6 10*3/uL (ref 4.0–10.5)

## 2011-03-27 LAB — GLUCOSE, CAPILLARY
Glucose-Capillary: 144 mg/dL — ABNORMAL HIGH (ref 70–99)
Glucose-Capillary: 222 mg/dL — ABNORMAL HIGH (ref 70–99)
Glucose-Capillary: 136 mg/dL — ABNORMAL HIGH (ref 70–99)
Glucose-Capillary: 202 mg/dL — ABNORMAL HIGH (ref 70–99)

## 2011-03-28 LAB — GLUCOSE, CAPILLARY: Glucose-Capillary: 145 mg/dL — ABNORMAL HIGH (ref 70–99)

## 2011-04-01 LAB — CULTURE, BLOOD (ROUTINE X 2)
Culture  Setup Time: 201207221712
Culture  Setup Time: 201207221712
Culture: NO GROWTH
Culture: NO GROWTH

## 2011-04-04 NOTE — Op Note (Signed)
Miguel Miller, Miguel Miller             ACCOUNT NO.:  [id number]  MEDICAL RECORD NO.:  [id number]  LOCATION:  3018                         FACILITY:  [facility]  PHYSICIAN:  Sherilyn Cooter A. Amna Welker, M.D.    DATE OF BIRTH:  [birth year]  DATE OF PROCEDURE:  03/25/2011 DATE OF DISCHARGE:                              OPERATIVE REPORT   PREOPERATIVE DIAGNOSIS:  L2-L3 retrolisthesis with stenosis, status post L3-L5 fusion.  POSTOPERATIVE DIAGNOSIS:  L2-L3 retrolisthesis with stenosis, status post L3-L5 fusion.  PROCEDURE NOTE:  Left-sided L2-L3 retroperitoneal anterolateral interbody fusion, rising PEEK interbody cage, Osteocel Plus and Actifuse putty.  Right-sided L2-L3 decompressive laminotomy with the right L2 and L3 decompressive foraminotomies, redo.  Re-exploration of right-sided L3- L5 fusion with removal of hardware.  L2-3 posterolateral arthrodesis utilizing nonsegmental pedicle fixation and local autografting and Actifuse putty.  SURGEON:  Kathaleen Maser. Braeton Wolgamott, MD  ASSISTANT:  Reinaldo Meeker, MD  ANESTHESIA:  General endotracheal.  INDICATION:  Miguel Miller is a 70 year old male status post previous L3- L5 decompression and fusion with good results.  The patient presents now with worsening back and bilateral lower extremity pain right greater than left.  Workup demonstrates evidence of retrolisthesis with marked stenosis at L2-3.  Fusion appears solid from L3-L5.  The patient has been counseled as to his options.  He has failed conservative management.  He decided to proceed with L2-3 XLIF followed by right- sided L2-3 decompression and posterolateral fusion.  DESCRIPTION OF PROCEDURE:  He was brought to operating room, placed on the operating room table in supine position.  After an adequate level of anesthesia achieved, the patient was in the right lateral decubitus position.  His body was secured and the table was flexed to open up an entry site through his left flank.  Using C-arm  guidance, 2 left-sided incisions were made, one overlying the disk space and one slightly posteriorly.  The posterior incision was used to access the retroperitoneal space.  A blunt probe was then passed through the retroperitoneal space down to the psoas muscle on the left side. Stimulation was made with intraoperative nerve monitoring and good stimulation of the nerves was confirmed.  The introducer was then placed over the posterior third of the L2-3 disk space.  This was actively stimulated and no neural structures were adjacent.  A K-wire was then passed into the disk space.  The introducer was then used to progressively dilate the channel, stimulating with each pass, all stimulations revealed good position.  The self-retaining retractor was placed and secured.  It was opened.  The lateral disk space was inspected and found to be free of any nerve or vascular elements.  This was then directly stimulated as also confirmed no adjacent neural structures.  Shim was placed.  Diskectomy was then performed using 15 blade and pituitary rongeurs.  Multiple curettes were then passed in the interspace.  Disk space underwent contralateral least and then was sequentially dilated up to 10 mm.  A 10-mm lordotic cage was then passed into the disk space and this confirmed a good fit.  A 10 lordotic x 15 mm x 22 mm cage was then packed with Actifuse putty and Osteocel Plus. This  was then packed into place under fluoroscopic guidance.  Once good position was then ensured, the inserter was removed.  Hemostasis was excellent.  Images were confirmed in both the lateral and AP plane and then the retractor was removed, inspected for hemostasis along the way. Hemostasis was very good.  The wounds were then both irrigated and closed in typical fashion.  The patient was repositioned prone. Attention was then placed to the posterior region for decompression and fusion.  A midline incision was then made  overlying L2-3.  A subperiosteal performed on the right side.  The lamina facet joints L2- L3.  Deep self-retaining was placed.  Intraoperative fluoroscopy was used and levels were confirmed.  Previously placed pedicle fixation at L3-L5 on the right side was dissected free.  The rod was then cut directly above the screw head at L4.  The pedicle screw fixation was then disassembled over the screw at L3.  The screw at L3 was inspected and found to be solidly within bone.  Decompressive laminotomy and foraminotomy then performed using high-speed drill and Kerrison rongeurs to remove the inferior aspect of lamina of L2, medial aspect of the C3 facet joint, and ligamentum flavum in epidural scar.  Wide decompressive foraminotomies were then performed along the course exiting L2-L3 nerve roots on the right side.  Disk space was identified, inspected and found to be free of any significant disk herniation.  The pedicle at L2 was then identified using fluoroscopic guidance and surface landmarks. Superficial bone around pedicle was then removed with high-speed drill. Each pedicle was then probed using pedicle awl.  Each pedicle awl track was then tapped with 5.25 mm screw tapper.  Each screw hole was then probed and found to be solid within bone.  6.75 x 45 mm radius screw was then placed to the pedicle of L2.  Transverse processes of L2-L3 decorticated using the high-speed drill.  Morselized autograft mixed with Actifuse putty was then packed posterolaterally for later fusion. Short segment titanium rods then placed over screw heads at L2-3. Locking caps were placed over the screw heads and locking caps then engaged with construct under compression.  Final images revealed good position of bone grafts, hardware with proper operative level and normal alignment of spine.  Wound was then irrigated with antibiotic solution. Hemostasis was assured with bipolar cautery.  Wound was closed in layers with  Vicryl suture.  Steri-Strips and sterile dressings were applied. There were no complications.  He tolerated the procedure well and he returns to recovery room postoperatively.          ______________________________ Kathaleen Maser Prather Failla, M.D.     HAP/MEDQ  D:  03/24/2011  T:  03/25/2011  Job:  [id number]  Electronically Signed by Julio Sicks M.D. on 04/04/2011 09:43:21 AM

## 2011-04-26 ENCOUNTER — Ambulatory Visit
Admission: RE | Admit: 2011-04-26 | Discharge: 2011-04-26 | Disposition: A | Discharge status: Home / Self Care | Payer: Medicare Other | Source: Ambulatory Visit | Attending: Neurosurgery | Admitting: Neurosurgery

## 2011-04-26 ENCOUNTER — Other Ambulatory Visit: Payer: Self-pay | Admitting: Neurosurgery

## 2011-04-26 DIAGNOSIS — M48061 Spinal stenosis, lumbar region without neurogenic claudication: Secondary | ICD-10-CM

## 2011-11-25 IMAGING — CR DG LUMBAR SPINE 2-3V
3 series · 3 of 3 positions shown · non-contrast
Comparison: MRI 02/14/2011

CLINICAL DATA: Spinal stenosis.  Low back pain

LUMBAR SPINE - 2-3 VIEW

[t l-spine a.p.]
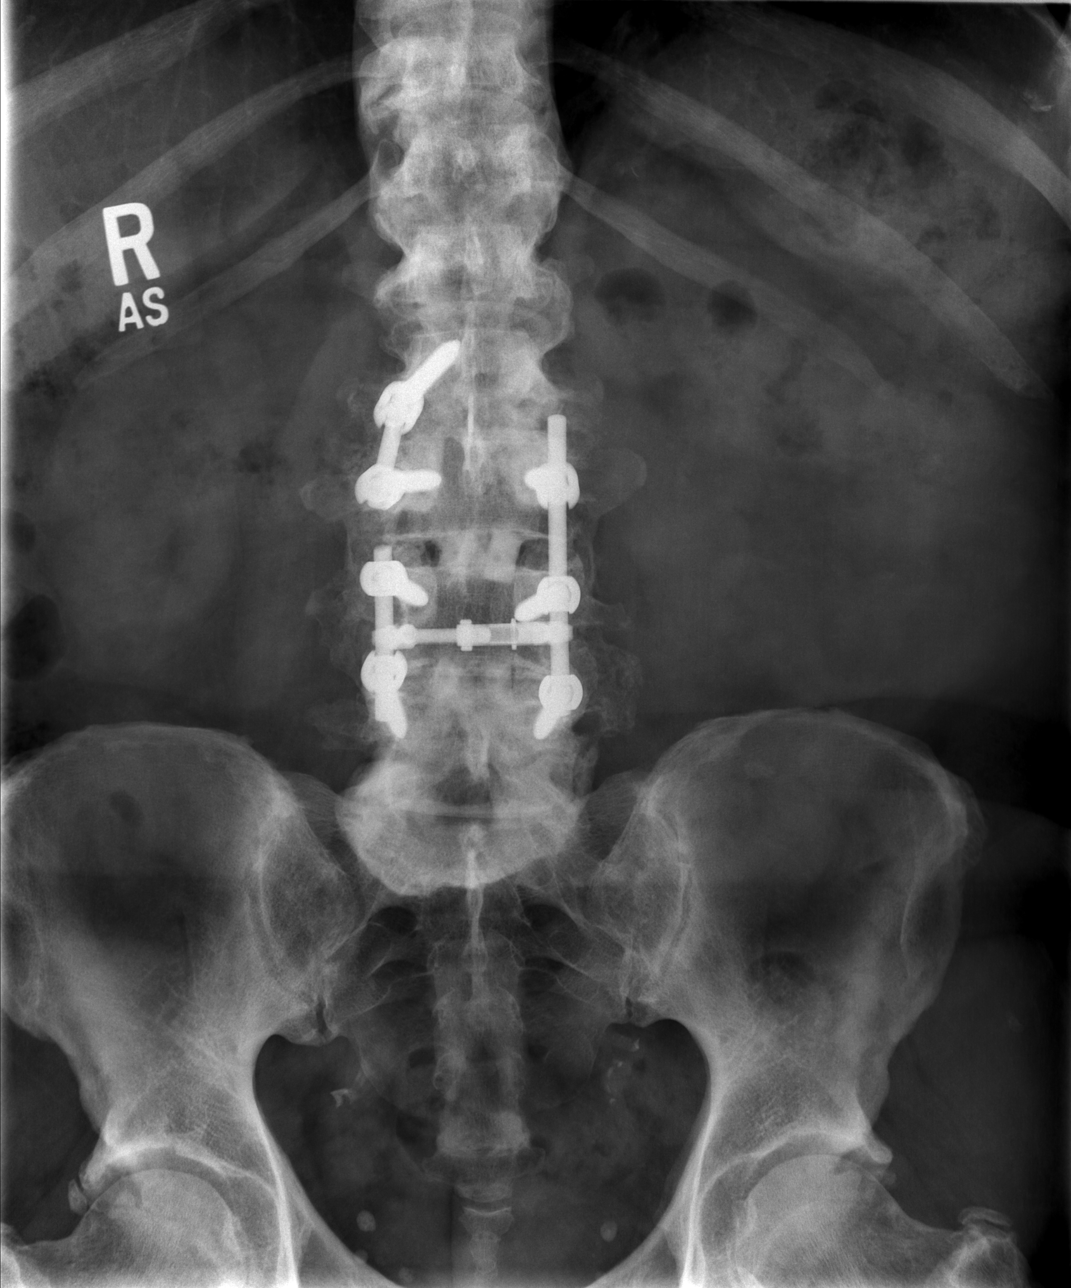

[t l-spine lat]
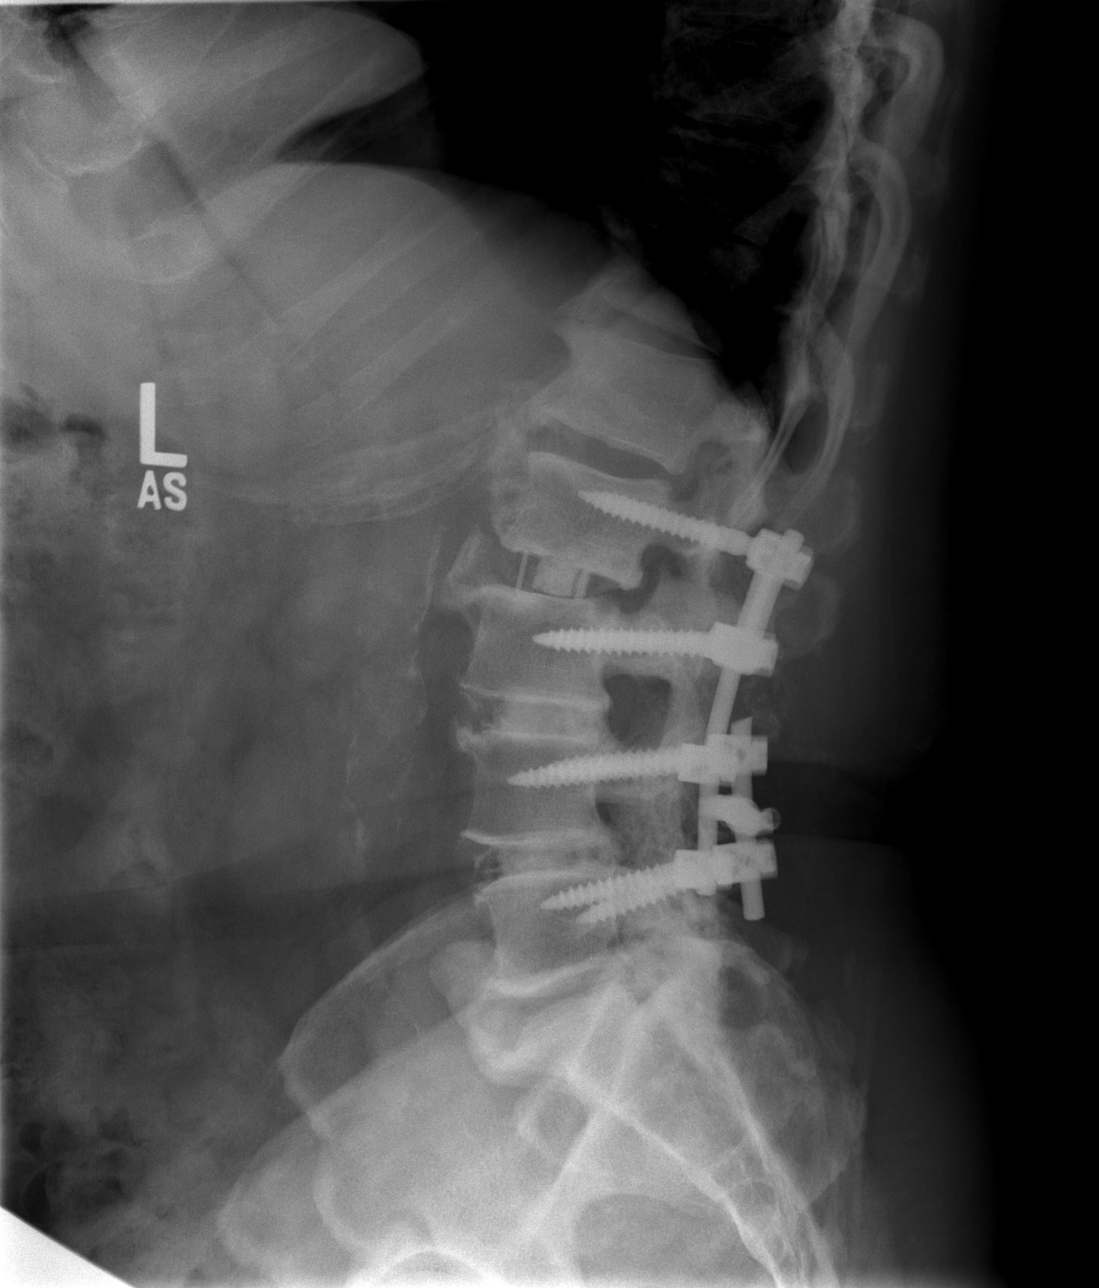

[t l-spine l5-s1 spot]
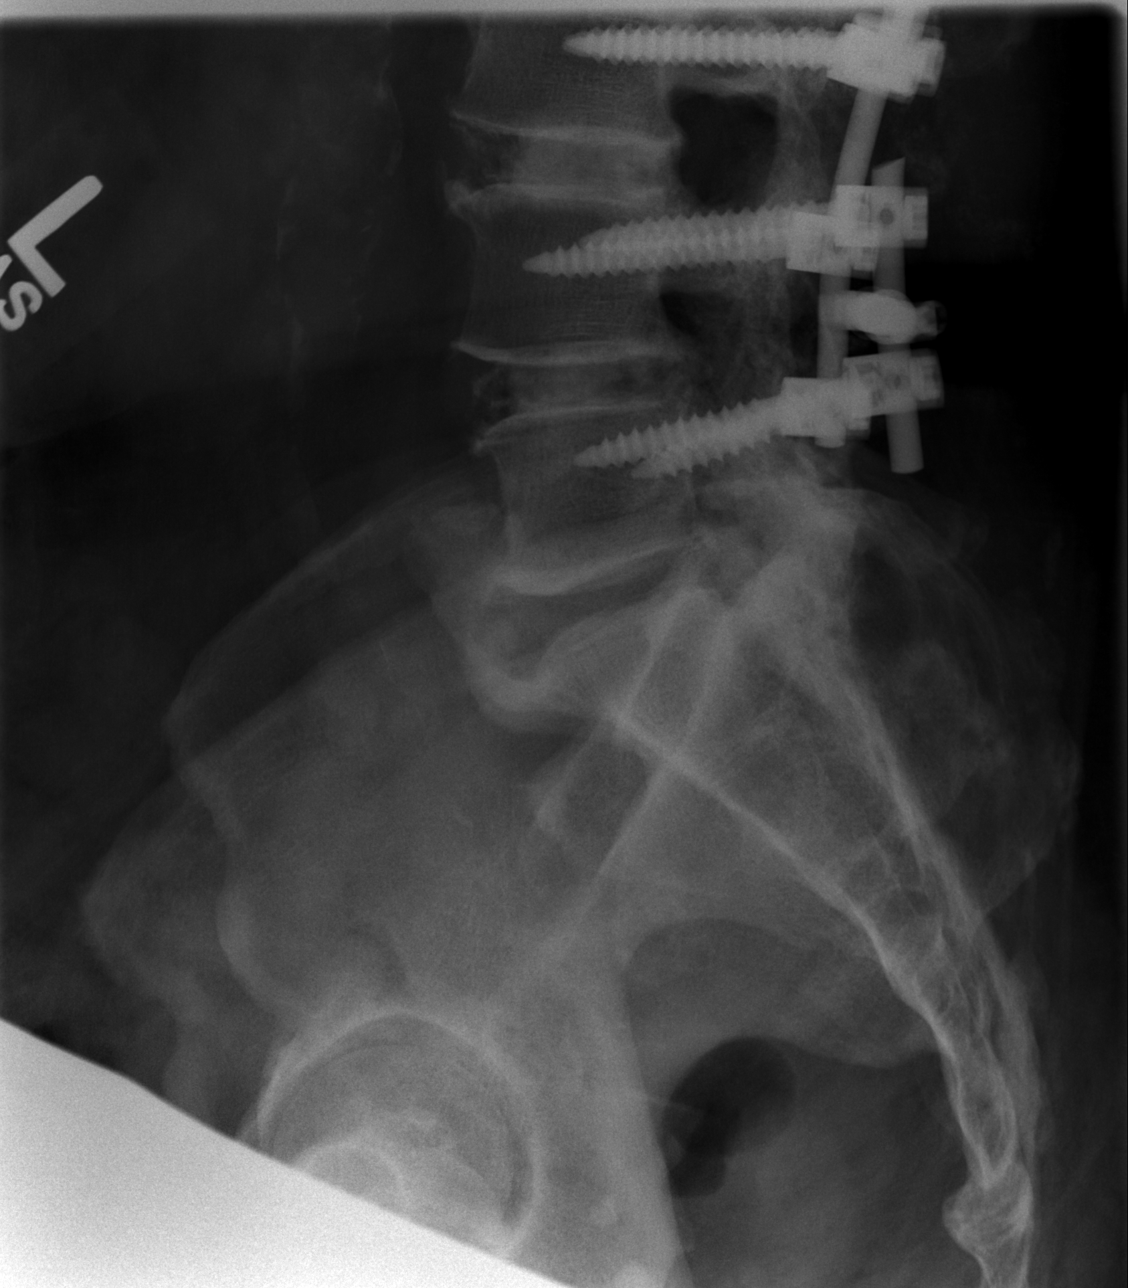

[3 of 3 positions shown; findings below may reference images not displayed]

FINDINGS: Mild anterior slip L4-L5.  Negative for fracture.

Bilateral pedicle screw and interbody fusion L4-5.  Unilateral
pedicle screw fusion on the left at L3-4 with interbody bone graft
at L3-4.  Unilateral pedicle screw fusion on the right at L2-3 with
interbody bone graft material at L2-3.  Large anterior osteophytes
at L5-S1.
IMPRESSION: Pedicle screw and interbody fusion L2-L5.  No acute radiographic
abnormality.

## 2012-08-04 HISTORY — PX: OTHER SURGICAL HISTORY: SHX169

## 2012-11-02 HISTORY — PX: OTHER SURGICAL HISTORY: SHX169

## 2013-07-01 ENCOUNTER — Encounter: Payer: Self-pay | Admitting: Neurology

## 2013-07-02 ENCOUNTER — Ambulatory Visit (INDEPENDENT_AMBULATORY_CARE_PROVIDER_SITE_OTHER): Payer: Medicare Other | Admitting: Neurology

## 2013-07-02 ENCOUNTER — Encounter: Payer: Self-pay | Admitting: Neurology

## 2013-07-02 VITALS — BP 128/72 | HR 74 | Ht 68.0 in | Wt 232.0 lb

## 2013-07-02 DIAGNOSIS — G250 Essential tremor: Secondary | ICD-10-CM

## 2013-07-02 DIAGNOSIS — R259 Unspecified abnormal involuntary movements: Secondary | ICD-10-CM

## 2013-07-02 DIAGNOSIS — R251 Tremor, unspecified: Secondary | ICD-10-CM

## 2013-07-02 NOTE — Progress Notes (Signed)
[facility]   Provider:  Dr Hosie Poisson Referring Provider: Catha Gosselin, MD Primary Care Physician:  Mickie Hillier, MD  CC:  tremor  HPI:  Miguel Miller is a 72 y.o. male here for initial evaluation of tremor  Patient notes having a 2-3 year history of tremor, he feels it likely started in both hands, is predominantly an action postural tremor. Denies any rest component. Initially did not bother him. Lately noted difficulty eating soup, as spoon got closer to mouth he would spill the soup. Decided it is time to have a tremor evaluated. He notes it is more with exercising stress. He does not drink caffeine alcohol, so unsure of their effect on the tremor. No generalized bradykinesia, no slowing down with walking. Was having some balance issues, has been doing yoga therapy which is greatly benefited him. Notes occasional muscle cramping during exercises. No trouble sleeping. No REM behavior disorder. No thyroid abnormalities. He does have a decrease lack of smell. Notes and messy her handwriting but no micrographia. No change in medications at time the tremor started.  No family history of tremor or neurodegenerative process. No exposure to dopamine blocking agents.   Concerns/Questions:Review of Systems: Out of a complete 14 system review, the patient complains of only the following symptoms, and all other reviewed systems are negative. + hearing loss, urinary problems, impotence, tremor  History   Social History  . Marital Status: Married    Spouse Name: Larita Fife    Number of Children: 2  . Years of Education: 16+   Occupational History  . retired Runner, broadcasting/film/video    Social History Main Topics  . Smoking status: Never Smoker   . Smokeless tobacco: Never Used  . Alcohol Use: Yes     Comment: rare  . Drug Use: No  . Sexual Activity: Not on file   Other Topics Concern  . Not on file   Social History Narrative   Patient lives with his wife.   Patient has 2 children.    Patient is retired.   Patient drinks 1 glass of caffeine daily.   Patient has a college education.    Family History  Problem Relation Age of Onset  . Emphysema Father   . Arthritis Father   . Diabetes Brother   . High blood pressure Brother     Past Medical History  Diagnosis Date  . Diabetes mellitus type 2, controlled, without complications   . Hypertension   . Dyslipidemia   . Low testosterone   . Eczema   . BPH (benign prostatic hyperplasia)   . GERD (gastroesophageal reflux disease)   . DJD (degenerative joint disease)   . Actinic keratoses   . Glaucoma     h/o retineal tear  . Diverticulosis     Past Surgical History  Procedure Laterality Date  . Outpatient right shoulder  2008    Dr. Eulah Pont  . Knee surgery Bilateral 2005    Dr. Luiz Blare  . L-spine  2003    Dr. Jordan Likes  . C-spine  1999    Dr. Jordan Likes  . Retinal detachment surgery  1996    Dr. Jean Rosenthal  . Hemorrhoidectomy with hemorrhoid banding    . Vasectomy    . Cataract extraction  1997  . Laser surgery for holes in retina Right 08/2012  . Retineal tear  11/2012    Dr. Monte Fantasia  . Fusion l2  2012    Dr. Jordan Likes    Current Outpatient Prescriptions  Medication Sig Dispense  Refill  . Acetaminophen (TYLENOL PO) Take by mouth daily.      [name] aspirin 81 MG tablet Take 81 mg by mouth daily.      [name] ezetimibe (ZETIA) 10 MG tablet Take 10 mg by mouth daily.      [name] lisinopril (PRINIVIL,ZESTRIL) 20 MG tablet Take 20 mg by mouth daily.      . Melatonin 5 MG TABS Take by mouth at bedtime.      [name] omeprazole (PRILOSEC) 20 MG capsule Take 20 mg by mouth daily as needed.      . testosterone (ANDROGEL) 50 MG/5GM GEL Place 5 g onto the skin daily. 1 packet in am and 1/2 packet in pm      . zolpidem (AMBIEN) 10 MG tablet Take 10 mg by mouth at bedtime as needed for sleep.       No current facility-administered medications for this visit.    Allergies as of 07/02/2013 - Review Complete 07/02/2013  Allergen Reaction Noted    . Hydrocodone bitartrate [hydrocodone]  07/01/2013  . Oxycodone  07/01/2013  . Statins  07/01/2013  . Welchol [colesevelam hcl]  07/01/2013    Vitals: BP 128/72  Pulse 74  Ht 5\' 8"  (1.727 m)  Wt 232 lb (105.235 kg)  BMI 35.28 kg/m2 Last Weight:  Wt Readings from Last 1 Encounters:  07/02/13 232 lb (105.235 kg)   Last Height:   Ht Readings from Last 1 Encounters:  07/02/13 5\' 8"  (1.727 m)     Physical exam: Exam: Gen: NAD, conversant Eyes: anicteric sclerae, moist conjunctivae HENT: Atraumatic Lungs: CTA, no wheezing, rales, rhonic                          CV: RRR, no MRG Abdomen: Soft, non-tender;  Extremities: No peripheral edema  Skin: Normal temperature, no rash,  Psych: Appropriate affect, pleasant  Neuro: MS: AA&Ox3, appropriately interactive, normal affect   Speech: fluent w/o paraphasic error  Memory: good recent and remote recall  CN: PERRL, EOMI no nystagmus, no ptosis, sensation intact to LT V1-V3 bilat, face symmetric, no weakness, hearing grossly intact, palate elevates symmetrically, shoulder shrug 5/5 bilat,  tongue protrudes midline, no fasiculations noted.  Motor: normal bulk and tone Strength: 5/5  In all extremities except mild weakness in LLE (notes chronic from childhood)  Reflexes: symmetrical with exception of absent patellar bilat (knee replacement in past), bilat downgoing toes  Sens: LT intact in all extremities  Brief Motor UPDRS  Speech: wnl  Facial Expression: no masked facies  Tremor: Rest R none L none  Action/postural R mild postural and noted intention tremor L mild postural and mild intention tremor  Rigidity: None  Finger taps:   normal, no bradykinesia  Open/close hands: Normal, no bradykinesia  Foot taps: Normal, no bradykinesia  Gait: Stands without assistance, normal arm swing bilaterally, narrow gait, turns in 3 steps, negative Romberg, negative Pull test   Assessment:  After physical and  neurologic examination, review of laboratory studies, imaging, neurophysiology testing and pre-existing records, assessment will be reviewed on the problem list.  Plan:  Treatment plan and additional workup will be reviewed under Problem List.  1)Essential Tremor  72y/o gentleman presenting for initial evaluation of tremor. Physical exam pertinent for bilateral hand postural and intention tremor. No noted rest tremor, bradykinesia or muscle rigidity. Based on history and exam, findings are most consistent with a diagnosis of essential tremor. Discussed this diagnosis with patient. Explained to  him the different therapeutic options. As his tremor is mild at this time he wishes to hold off on medication. Will check TSH to rule out thyroid as possible etiology. Follow up patient as needed.

## 2013-07-02 NOTE — Patient Instructions (Signed)
Overall you are doing fairly well but I do want to suggest a few things today:   Your symptoms are most consistent with a diagnosis of essential tremor.  I would like to check your thyroid levels as this can be a cause of tremor.   We discussed medication options and decided to hold off for now. Please let us know if your symptoms worsen or change  We can follow up as needed at this time.    Please also call us for any test results so we can go over those with you on the phone.  My clinical assistant and will answer any of your questions and relay your messages to me and also relay most of my messages to you.   Our phone number is [phone]. We also have an after hours call service for urgent matters and there is a physician on-call for urgent questions. For any emergencies you know to call 911 or go to the nearest emergency room

## 2013-07-03 LAB — TSH: TSH: 1.13 u[IU]/mL (ref 0.450–4.500)

## 2013-07-03 NOTE — Progress Notes (Signed)
Quick Note:  I called pt and relayed the results of lab (TSH) normal. He verbalized understanding. ______

## 2013-11-17 ENCOUNTER — Other Ambulatory Visit: Payer: Self-pay | Admitting: Neurosurgery

## 2013-11-17 DIAGNOSIS — M47817 Spondylosis without myelopathy or radiculopathy, lumbosacral region: Secondary | ICD-10-CM

## 2013-11-28 ENCOUNTER — Ambulatory Visit
Admission: RE | Admit: 2013-11-28 | Discharge: 2013-11-28 | Disposition: A | Discharge status: Home / Self Care | Payer: 59 | Source: Ambulatory Visit | Attending: Neurosurgery | Admitting: Neurosurgery

## 2013-11-28 DIAGNOSIS — M47817 Spondylosis without myelopathy or radiculopathy, lumbosacral region: Secondary | ICD-10-CM

## 2013-11-28 MED ORDER — GADOBENATE DIMEGLUMINE 529 MG/ML IV SOLN
20.0000 mL | Freq: Once | INTRAVENOUS | Status: AC | PRN
  Administered 2013-11-28: 20 mL via INTRAVENOUS

## 2014-04-08 ENCOUNTER — Ambulatory Visit
Admission: RE | Admit: 2014-04-08 | Discharge: 2014-04-08 | Disposition: A | Discharge status: Home / Self Care | Payer: 59 | Source: Ambulatory Visit | Attending: Physician Assistant | Admitting: Physician Assistant

## 2014-04-08 ENCOUNTER — Other Ambulatory Visit: Payer: Self-pay | Admitting: Physician Assistant

## 2014-04-08 DIAGNOSIS — M545 Low back pain, unspecified: Secondary | ICD-10-CM

## 2014-04-08 DIAGNOSIS — M546 Pain in thoracic spine: Secondary | ICD-10-CM

## 2016-02-10 ENCOUNTER — Other Ambulatory Visit: Payer: Self-pay | Admitting: Family Medicine

## 2016-02-10 ENCOUNTER — Ambulatory Visit (HOSPITAL_BASED_OUTPATIENT_CLINIC_OR_DEPARTMENT_OTHER)
Admission: RE | Admit: 2016-02-10 | Discharge: 2016-02-10 | Disposition: A | Discharge status: Home / Self Care | Payer: Medicare Other | Source: Ambulatory Visit | Attending: Family Medicine | Admitting: Family Medicine

## 2016-02-10 ENCOUNTER — Other Ambulatory Visit (HOSPITAL_BASED_OUTPATIENT_CLINIC_OR_DEPARTMENT_OTHER): Payer: Self-pay | Admitting: Family Medicine

## 2016-02-10 DIAGNOSIS — R609 Edema, unspecified: Secondary | ICD-10-CM | POA: Diagnosis not present

## 2016-02-10 DIAGNOSIS — R52 Pain, unspecified: Secondary | ICD-10-CM | POA: Insufficient documentation

## 2016-06-08 ENCOUNTER — Other Ambulatory Visit: Payer: Self-pay | Admitting: Family Medicine

## 2016-06-08 DIAGNOSIS — R61 Generalized hyperhidrosis: Secondary | ICD-10-CM

## 2016-06-16 ENCOUNTER — Other Ambulatory Visit: Payer: Self-pay | Admitting: Family Medicine

## 2016-06-16 ENCOUNTER — Ambulatory Visit
Admission: RE | Admit: 2016-06-16 | Discharge: 2016-06-16 | Disposition: A | Discharge status: Home / Self Care | Payer: Medicare Other | Source: Ambulatory Visit | Attending: Family Medicine | Admitting: Family Medicine

## 2016-06-16 DIAGNOSIS — R61 Generalized hyperhidrosis: Secondary | ICD-10-CM

## 2016-06-16 DIAGNOSIS — R935 Abnormal findings on diagnostic imaging of other abdominal regions, including retroperitoneum: Secondary | ICD-10-CM

## 2016-06-16 DIAGNOSIS — E049 Nontoxic goiter, unspecified: Secondary | ICD-10-CM

## 2016-06-16 MED ORDER — IOPAMIDOL (ISOVUE-300) INJECTION 61%: 100.0000 mL | Freq: Once | INTRAVENOUS | Status: DC | PRN

## 2016-06-20 ENCOUNTER — Telehealth: Payer: Self-pay | Admitting: Cardiovascular Disease

## 2016-06-20 NOTE — Telephone Encounter (Signed)
Received records from [facility] for appointment on 07/03/16 with Dr Sallyanne Kuster.  Records given to [facility] (medical records) for Dr Croitoru's schedule on 07/03/16. lp

## 2016-06-23 ENCOUNTER — Other Ambulatory Visit: Payer: Medicare Other

## 2016-06-27 ENCOUNTER — Ambulatory Visit
Admission: RE | Admit: 2016-06-27 | Discharge: 2016-06-27 | Disposition: A | Discharge status: Home / Self Care | Payer: Medicare Other | Source: Ambulatory Visit | Attending: Family Medicine | Admitting: Family Medicine

## 2016-06-27 DIAGNOSIS — E049 Nontoxic goiter, unspecified: Secondary | ICD-10-CM

## 2016-06-27 DIAGNOSIS — R935 Abnormal findings on diagnostic imaging of other abdominal regions, including retroperitoneum: Secondary | ICD-10-CM

## 2016-07-03 ENCOUNTER — Encounter: Payer: Self-pay | Admitting: Cardiovascular Disease

## 2016-07-03 ENCOUNTER — Ambulatory Visit (INDEPENDENT_AMBULATORY_CARE_PROVIDER_SITE_OTHER): Payer: Medicare Other | Admitting: Cardiovascular Disease

## 2016-07-03 VITALS — BP 130/70 | HR 83 | Ht 68.0 in | Wt 233.6 lb

## 2016-07-03 DIAGNOSIS — I739 Peripheral vascular disease, unspecified: Secondary | ICD-10-CM | POA: Diagnosis not present

## 2016-07-03 DIAGNOSIS — R0602 Shortness of breath: Secondary | ICD-10-CM

## 2016-07-03 DIAGNOSIS — I2089 Other forms of angina pectoris: Secondary | ICD-10-CM

## 2016-07-03 DIAGNOSIS — E7800 Pure hypercholesterolemia, unspecified: Secondary | ICD-10-CM

## 2016-07-03 DIAGNOSIS — I208 Other forms of angina pectoris: Secondary | ICD-10-CM

## 2016-07-03 DIAGNOSIS — I2510 Atherosclerotic heart disease of native coronary artery without angina pectoris: Secondary | ICD-10-CM | POA: Insufficient documentation

## 2016-07-03 DIAGNOSIS — I10 Essential (primary) hypertension: Secondary | ICD-10-CM

## 2016-07-03 DIAGNOSIS — E1169 Type 2 diabetes mellitus with other specified complication: Secondary | ICD-10-CM

## 2016-07-03 DIAGNOSIS — E669 Obesity, unspecified: Secondary | ICD-10-CM

## 2016-07-03 DIAGNOSIS — E668 Other obesity: Secondary | ICD-10-CM

## 2016-07-03 DIAGNOSIS — I509 Heart failure, unspecified: Secondary | ICD-10-CM | POA: Insufficient documentation

## 2016-07-03 NOTE — Patient Instructions (Signed)
Medication Instructions: Dr Sallyanne Kuster recommends that you continue on your current medications as directed. Please refer to the Current Medication list given to you today.  Labwork: NONE ORDERED  Testing/Procedures: 1. Echocardiogram - Your physician has requested that you have an echocardiogram. Echocardiography is a painless test that uses sound waves to create images of your heart. It provides your doctor with information about the size and shape of your heart and how well your heart's chambers and valves are working. This procedure takes approximately one hour. There are no restrictions for this procedure. This will be performed at our [facility] location - [address], [place] physician has requested that you have a lexiscan myoview. For further information please visit [email or web address]. Please follow instruction sheet, as given.  3. Lower Extremity Arterial Dopplers - Your physician has requested that you have a lower or upper extremity arterial duplex. This test is an ultrasound of the arteries in the legs or arms. It looks at arterial blood flow in the legs and arms. Allow one hour for Lower and Upper Arterial scans. There are no restrictions or special instructions.  Follow-up: Dr Sallyanne Kuster recommends that you schedule a follow-up appointment after all tests are completed.  If you need a refill on your cardiac medications before your next appointment, please call your pharmacy.

## 2016-07-03 NOTE — Progress Notes (Signed)
Cardiology Consultation Note    Date:  07/03/2016   ID:  Miguel Miller, DOB May 17, 1941, MRN [id number]  PCP:  Gennette Pac, MD  Cardiologist:   Sanda Klein, MD   Chief Complaint  Miguel Miller presents with  . New Evaluation    pt c/o chest tighness and ct scan showed CAD    History of Present Illness:  Miguel Miller is a 75 y.o. male with obesity, well-controlled type 2 diabetes mellitus, hyperlipidemia and hypertension referred for evaluation of exertional chest tightness and dyspnea as well as coronary calcification on CT.  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 Over the last roughly year and a half he has been helping his wife go through treatment for breast cancer.  He has noticed a slowly progressive pattern of worsening chest tightness and dyspnea with physical activity. The symptoms never occur at rest. They're often accompanied by profuse diaphoresis. He does not have orthopnea or PND. He has also had recent problems with bilateral hip pain with walking. He describes a fairly predictable pattern of walking about 200 feet before he has to stop due to bilateral hip pain. He has previously undergone bilateral knee surgery and went to see his orthopedic surgeon Dr. Percell Miller. Apparently there are no problems with the actual hip joints, but he received bilateral cortisone injections for bursitis. These have not helped. He does not have classic calf claudication. Miguel Miller has had several years of problems with erectile dysfunction. This did respond for a while to direct penile injections, but since about 2 years ago that the medication has had less effect.   He is very conscientious with his diet and tries to exercise. He has been less successful with this during his wife's illness, but recently has started working on losing weight again. His control is  good without medication. Most recent hemoglobin A1c was 6.4%. His lipid profile is also favorable (total cholesterol 145, triglycerides 78, HDL 45, LDL 85). He has low testosterone levels are adequately compensated with AndroGel.  A workup to explain his 2-3 months of drenching night time sweats a chest CT demonstrated substantial atherosclerosis in the coronaries, aorta and aortic arch branches. It was also aortoiliac atherosclerosis. ESR and CRP were normal.  Past Medical History:  Diagnosis Date  . Actinic keratoses   . Arteriosclerosis   . BPH (benign prostatic hyperplasia)   . Diabetes mellitus type 2, controlled, without complications ([place])   . Diverticulosis   . DJD (degenerative joint disease)   . Dyslipidemia   . Eczema   . ED (erectile dysfunction)   . Essential tremor   . GERD (gastroesophageal reflux disease)   . Glaucoma    h/o retineal tear  . Hypertension   . Low testosterone   . Thrombocytopenia ([place])   . Thyroid goiter     Past Surgical History:  Procedure Laterality Date  . c-spine  1999   Dr. Annette Stable  . CATARACT EXTRACTION  1997  . fusion L2  2012   Dr. Annette Stable  . HEMORRHOIDECTOMY WITH HEMORRHOID BANDING    . KNEE SURGERY Bilateral 2005   Dr. Berenice Primas  . l-spine  2003   Dr. Annette Stable  . laser surgery for holes in retina Right 08/2012  . outpatient right shoulder  2008   Dr. Percell Miller  . RETINAL DETACHMENT SURGERY  1996   Dr. Glennon Mac  .  retineal tear  11/2012   Dr. Darrick Grinder  . VASECTOMY      Current Medications: Outpatient Medications Prior to Visit  Medication Sig Dispense Refill  . Acetaminophen (TYLENOL PO) Take by mouth as needed.     [name] aspirin 81 MG tablet Take 81 mg by mouth daily.    [name] ezetimibe (ZETIA) 10 MG tablet Take 10 mg by mouth daily.    [name] lisinopril (PRINIVIL,ZESTRIL) 20 MG tablet Take 20 mg by mouth daily.    . Melatonin 5 MG TABS Take by mouth at bedtime.    [name] omeprazole (PRILOSEC) 20 MG capsule Take 20 mg by mouth daily as needed.    .  testosterone (ANDROGEL) 50 MG/5GM GEL Place 5 g onto the skin daily. 1 packet in am and 1/2 packet in pm    . zolpidem (AMBIEN) 10 MG tablet Take 10 mg by mouth at bedtime as needed for sleep.     No facility-administered medications prior to visit.      Allergies:   Hydrocodone bitartrate [hydrocodone]; Oxycodone; Statins; and Welchol [colesevelam hcl]   Social History   Social History  . Marital status: Married    Spouse name: Miguel Miller  . Number of children: 2  . Years of education: 16+   Occupational History  . retired Pharmacist, hospital    Social History Main Topics  . Smoking status: Never Smoker  . Smokeless tobacco: Never Used  . Alcohol use Yes     Comment: rare  . Drug use: No  . Sexual activity: Not Asked   Other Topics Concern  . None   Social History Narrative   Miguel Miller lives with his wife.   Miguel Miller has 2 children.   Miguel Miller is retired.   Miguel Miller drinks 1 glass of caffeine daily.   Miguel Miller has a college education.     Family History:  The Miguel Miller's family history includes Arthritis in his father; Diabetes in his brother; Emphysema in his father; Heart disease in his paternal grandfather; High blood pressure in his brother.   ROS:   Please see the history of present illness.    ROS All other systems reviewed and are negative.   PHYSICAL EXAM:   VS:  BP 130/70 (BP Location: Right Arm, Miguel Miller Position: Sitting, Cuff Size: Normal)   Pulse 83   Ht 5' 8" (1.727 m)   Wt 233 lb 9.6 oz (106 kg)   BMI 35.52 kg/m    GEN: Well nourished, well developed, in no acute distress  HEENT: normal  Neck: no JVD, carotid bruits, or masses Cardiac: RRR; no murmurs, rubs, or gallops,no edema ; he has bilateral 2+ radial ulnar and brachial pulses without subclavian bruits. He has bilateral femoral bruits. The femoral arteries and popliteals are difficult to palpate. He has 2+ right dorsalis pedis and 1+ right posterior tibial pulse. He has barely palpable left pedal pulses. Respiratory:   clear to auscultation bilaterally, normal work of breathing GI: soft, nontender, nondistended, + BS MS: no deformity or atrophy  Skin: warm and dry, no rash Neuro:  Alert and Oriented x 3, Strength and sensation are intact. Essential tremor is especially obvious in his right thumb Psych: euthymic mood, full affect  Wt Readings from Last 3 Encounters:  07/03/16 233 lb 9.6 oz (106 kg)  07/02/13 232 lb (105.2 kg)      Studies/Labs Reviewed:   EKG:  EKG is ordered today.  The ekg ordered today demonstrates Normal sinus rhythm, left axis deviation  Recent Labs:  hemoglobin A1c was 6.4%. Hgb 17.6, creatinine 1.17, normal liver function tests, potassium 5.0, no evidence of microalbuminuria   Lipid Panel  Total cholesterol 145, triglycerides 78, HDL 45, LDL 85  Additional studies/ records that were reviewed today include:  Reviewed images from his CT angiogram, with substantial calcification in all 3 coronaries especially obvious in the proximal to mid LAD. No atherosclerosis in the distal aorta and proximal iliac arteries but at least in the segments viewed there does not appear to be any flow limiting stenosis.    ASSESSMENT:    1. Stable angina pectoris ([place])   2. Shortness of breath   3. Intermittent claudication ([place])   4. Essential hypertension   5. Diabetes mellitus type 2 in obese ([place])   6. Hypercholesterolemia   7. Moderate obesity      PLAN:  In order of problems listed above:  1. CAD: Miguel Miller seems to be describing CCS class II exertional angina pectoris and has evidence of coronary atherosclerosis with calcification on CT. He is currently not taking any antianginal medications. Consider adding a beta blockerl, probably to partly replace the current dose of lisinopril. Consider using propranolol since this could also help with his essential tremor. We'll get a [employer] study since his hip pain will probably prevent sufficient exercise on the treadmill.  If he has extensive/anterior wall ischemia, he should go for coronary angiography. If this study shows low risk findings, focus on medical therapy. 2. CHF?: He has NYHA functional class II exertional dyspnea with slow progression. Schedule for echocardiogram. No overt hypervolemia on physical exam 3. PAD?:  Miguel Miller may be describing buttock claudication related to distal aorta/bilateral iliac artery stenoses. This might also be at least partly responsible for his problems with erectile dysfunction. Will perform lower extremity arterial Doppler evaluation. Consider referral for revascularization. 4. HTN: Well-controlled 5. DM type 2: Controlled with diet alone 6. HLP: He reports being tried on 6 different statins which all produced side effects. His liver profile is that she not bad at all on Zetia monotherapy. If significant CAD or PAD is identified, his target LDL would be less than 70. Consider PCS K9 inhibitors. 7. Obesity: Congratulated on his efforts to lose weight. Has already paid off in his ability to control diabetes without medications. Note that his polycythemia. Consider evaluation for obstructive sleep apnea, although he does not give typical symptoms of this   Medication Adjustments/Labs and Tests Ordered: Current medicines are reviewed at length with the Miguel Miller today.  Concerns regarding medicines are outlined above.  Medication changes, Labs and Tests ordered today are listed in the Miguel Miller Instructions below. Miguel Miller Instructions  Medication Instructions: Dr Sallyanne Kuster recommends that you continue on your current medications as directed. Please refer to the Current Medication list given to you today.  Labwork: NONE ORDERED  Testing/Procedures: 1. Echocardiogram - Your physician has requested that you have an echocardiogram. Echocardiography is a painless test that uses sound waves to create images of your heart. It provides your doctor with information about the size and shape  of your heart and how well your heart's chambers and valves are working. This procedure takes approximately one hour. There are no restrictions for this procedure. This will be performed at our [facility] location - [address] [place] physician has requested that you have a lexiscan myoview. For further information please visit [email or web address]. Please follow instruction sheet, as given.  3. Lower  Extremity Arterial Dopplers - Your physician has requested that you have a lower or upper extremity arterial duplex. This test is an ultrasound of the arteries in the legs or arms. It looks at arterial blood flow in the legs and arms. Allow one hour for Lower and Upper Arterial scans. There are no restrictions or special instructions.  Follow-up: Dr Sallyanne Kuster recommends that you schedule a follow-up appointment after all tests are completed.  If you need a refill on your cardiac medications before your next appointment, please call your pharmacy.    Signed, Sanda Klein, MD  07/03/2016 1:23 PM    [facility] [place], [place], [place]  [address] Phone: [phone]; Fax: [phone]

## 2016-07-11 ENCOUNTER — Encounter: Payer: Self-pay | Admitting: Endocrinology

## 2016-07-11 ENCOUNTER — Ambulatory Visit (INDEPENDENT_AMBULATORY_CARE_PROVIDER_SITE_OTHER): Payer: Medicare Other | Admitting: Endocrinology

## 2016-07-11 DIAGNOSIS — R61 Generalized hyperhidrosis: Secondary | ICD-10-CM | POA: Diagnosis not present

## 2016-07-11 DIAGNOSIS — E049 Nontoxic goiter, unspecified: Secondary | ICD-10-CM | POA: Diagnosis not present

## 2016-07-11 NOTE — Progress Notes (Signed)
Subjective:    Patient ID: Miguel Miller, male    DOB: 1941-06-26, 75 y.o.   MRN: [id number]  HPI 3 weeks ago, pt was incidentally noted on CT to have a nodule in the left lobe of the thyroid.  No assoc pain.  He is unaware of ever having had thyroid problems in the past.  He has no h/o XRT or surgery to the neck. Main symptom is intermittent excessive diaphoresis.   Past Medical History:  Diagnosis Date  . Actinic keratoses   . Arteriosclerosis   . BPH (benign prostatic hyperplasia)   . Diabetes mellitus type 2, controlled, without complications ([place])   . Diverticulosis   . DJD (degenerative joint disease)   . Dyslipidemia   . Eczema   . ED (erectile dysfunction)   . Essential tremor   . GERD (gastroesophageal reflux disease)   . Glaucoma    h/o retineal tear  . Hypertension   . Low testosterone   . Thrombocytopenia ([place])   . Thyroid goiter     Past Surgical History:  Procedure Laterality Date  . c-spine  1999   Dr. Annette Stable  . CATARACT EXTRACTION  1997  . fusion L2  2012   Dr. Annette Stable  . HEMORRHOIDECTOMY WITH HEMORRHOID BANDING    . KNEE SURGERY Bilateral 2005   Dr. Berenice Primas  . l-spine  2003   Dr. Annette Stable  . laser surgery for holes in retina Right 08/2012  . outpatient right shoulder  2008   Dr. Percell Miller  . RETINAL DETACHMENT SURGERY  1996   Dr. Glennon Mac  . retineal tear  11/2012   Dr. Darrick Grinder  . VASECTOMY      Social History   Social History  . Marital status: Married    Spouse name: Jeani Hawking  . Number of children: 2  . Years of education: 16+   Occupational History  . retired Pharmacist, hospital    Social History Main Topics  . Smoking status: Never Smoker  . Smokeless tobacco: Never Used  . Alcohol use Yes     Comment: rare  . Drug use: No  . Sexual activity: Not on file   Other Topics Concern  . Not on file   Social History Narrative   Patient lives with his wife.   Patient has 2 children.   Patient is retired.   Patient drinks 1 glass of caffeine daily.   Patient has a college education.    Current Outpatient Prescriptions on File Prior to Visit  Medication Sig Dispense Refill  . Acetaminophen (TYLENOL PO) Take by mouth as needed.     [name] aspirin 81 MG tablet Take 81 mg by mouth daily.    [name] ezetimibe (ZETIA) 10 MG tablet Take 10 mg by mouth daily.    [name] lisinopril (PRINIVIL,ZESTRIL) 20 MG tablet Take 20 mg by mouth daily.    . Melatonin 5 MG TABS Take by mouth at bedtime.    [name] omeprazole (PRILOSEC) 20 MG capsule Take 20 mg by mouth daily as needed.    . zolpidem (AMBIEN) 10 MG tablet Take 10 mg by mouth at bedtime as needed for sleep.     No current facility-administered medications on file prior to visit.     Allergies  Allergen Reactions  . Hydrocodone Bitartrate [Hydrocodone] Hives  . Oxycodone Hives  . Statins Other (See Comments)    Muscle aches   . Welchol [Colesevelam Hcl] Other (See Comments)    constipation    Family History  Problem  Relation Age of Onset  . Emphysema Father   . Arthritis Father   . Diabetes Brother   . High blood pressure Brother   . Heart disease Paternal Grandfather   . Thyroid nodules Neg Hx    BP 138/72   Pulse 75   Ht 5\' 8"  (1.727 m)   Wt 235 lb (106.6 kg)   SpO2 98%   BMI 35.73 kg/m   Review of Systems Denies hoarseness, neck pain, visual loss, chest pain, sob, skin rash, easy bruising, depression, cold intolerance, headache, and numbness.  He has slight difficulty swallowing pills.  He has lost a few lbs, due to his efforts.  He has slight rhinorrhea.      Objective:   Physical Exam VS: see vs page GEN: no distress HEAD: head: no deformity eyes: no periorbital swelling, no proptosis external nose and ears are normal mouth: no lesion seen.  NECK: a healed scar is present (C-spine procedure).  I can feel the top of a goiter, but I can't tell details    CHEST WALL: no deformity LUNGS: clear to auscultation CV: reg rate and rhythm, no murmur ABD: abdomen is soft, nontender.  no  hepatosplenomegaly.  not distended.  no hernia MUSCULOSKELETAL: muscle bulk and strength are grossly normal.  no obvious joint swelling.  gait is normal and steady EXTEMITIES: no deformity.  1+ bilat leg edema PULSES: no carotid bruit NEURO:  cn 2-12 grossly intact.   readily moves all 4's.  sensation is intact to touch on all 4's.   SKIN:  Normal texture and temperature.  No rash or suspicious lesion is visible.   NODES:  None palpable at the neck PSYCH: alert, well-oriented.  Does not appear anxious nor depressed.  I have reviewed outside records, and summarized: Pt was ref here for multinodular goiter.  Main problems addressed were excessive diaphoresis, and persistent resp infection.    outside test results are reviewed: TSH=0.96  [place]: multinodular goiter. The dominant approximately 1.9 cm nodule within the inferior aspect of the left lobe of the thyroid correlates with the nodule seen on preceding chest CT and meets imaging criteria to recommend percutaneous sampling as clinically indicated.       Assessment & Plan:  Multinodular goiter, bx needed.   H/o C-spine surgery: he can't be optimally positioned for bx by palpation here at the office today.   Excessive diaphoresis, new.    Patient is advised the following: Patient Instructions  Let's check a biopsy, guided by ultrasound.  you will receive a phone call, about a day and time for an appointment A 24-HR urine test is requested for you today.  We'll let you know about the results. Please come back for a follow-up appointment in 6 months.

## 2016-07-11 NOTE — Patient Instructions (Addendum)
Let's check a biopsy, guided by ultrasound.  you will receive a phone call, about a day and time for an appointment A 24-HR urine test is requested for you today.  We'll let you know about the results. Please come back for a follow-up appointment in 6 months.

## 2016-07-14 ENCOUNTER — Other Ambulatory Visit (HOSPITAL_COMMUNITY)
Admission: RE | Admit: 2016-07-14 | Discharge: 2016-07-14 | Disposition: A | Discharge status: Home / Self Care | Payer: Medicare Other | Source: Ambulatory Visit | Attending: General Surgery | Admitting: General Surgery

## 2016-07-14 ENCOUNTER — Ambulatory Visit
Admission: RE | Admit: 2016-07-14 | Discharge: 2016-07-14 | Disposition: A | Discharge status: Home / Self Care | Payer: Medicare Other | Source: Ambulatory Visit | Attending: Endocrinology | Admitting: Endocrinology

## 2016-07-14 DIAGNOSIS — E049 Nontoxic goiter, unspecified: Secondary | ICD-10-CM

## 2016-07-14 DIAGNOSIS — D34 Benign neoplasm of thyroid gland: Secondary | ICD-10-CM | POA: Insufficient documentation

## 2016-07-14 LAB — CALCITONIN: Calcitonin: <2 pg/mL (ref <=10)

## 2016-07-18 ENCOUNTER — Other Ambulatory Visit: Payer: Medicare Other

## 2016-07-18 NOTE — Addendum Note (Signed)
Addended by: Kaylyn Lim I on: 07/18/2016 09:49 AM   Modules accepted: Orders

## 2016-07-22 LAB — CATECHOLAMINES, FRACTIONATED, URINE, 24 HOUR
Calculated Total (E+NE): 36 mcg/24 h (ref 26–121)
Creatinine, Urine mg/day-CATEUR: 1.51 g/(24.h) (ref 0.63–2.50)
Dopamine, 24 hr Urine: 221 mcg/24 h (ref 52–480)
Norepinephrine, 24 hr Ur: 36 mcg/24 h (ref 15–100)
Total Volume - CF 24Hr U: 1050 mL

## 2016-07-22 LAB — METANEPHRINES, URINE, 24 HOUR
Metaneph Total, Ur: 282 mcg/24 h (ref 224–832)
Metanephrines, Ur: 110 mcg/24 h (ref 90–315)
Normetanephrine, 24H Ur: 172 mcg/24 h (ref 122–676)

## 2016-07-24 ENCOUNTER — Other Ambulatory Visit: Payer: Self-pay | Admitting: Cardiovascular Disease

## 2016-07-24 DIAGNOSIS — I739 Peripheral vascular disease, unspecified: Secondary | ICD-10-CM

## 2016-07-26 ENCOUNTER — Telehealth (HOSPITAL_COMMUNITY): Payer: Self-pay

## 2016-07-26 NOTE — Telephone Encounter (Signed)
Encounter complete. 

## 2016-07-31 ENCOUNTER — Other Ambulatory Visit (HOSPITAL_COMMUNITY): Payer: Medicare Other

## 2016-08-02 ENCOUNTER — Ambulatory Visit (HOSPITAL_COMMUNITY)
Admission: RE | Admit: 2016-08-02 | Discharge: 2016-08-02 | Disposition: A | Discharge status: Home / Self Care | Payer: Medicare Other | Source: Ambulatory Visit | Attending: Cardiovascular Disease | Admitting: Cardiovascular Disease

## 2016-08-02 DIAGNOSIS — E785 Hyperlipidemia, unspecified: Secondary | ICD-10-CM | POA: Diagnosis not present

## 2016-08-02 DIAGNOSIS — M25551 Pain in right hip: Secondary | ICD-10-CM | POA: Insufficient documentation

## 2016-08-02 DIAGNOSIS — R5383 Other fatigue: Secondary | ICD-10-CM | POA: Diagnosis not present

## 2016-08-02 DIAGNOSIS — I10 Essential (primary) hypertension: Secondary | ICD-10-CM | POA: Insufficient documentation

## 2016-08-02 DIAGNOSIS — E1151 Type 2 diabetes mellitus with diabetic peripheral angiopathy without gangrene: Secondary | ICD-10-CM | POA: Diagnosis not present

## 2016-08-02 DIAGNOSIS — I208 Other forms of angina pectoris: Secondary | ICD-10-CM

## 2016-08-02 DIAGNOSIS — Z6835 Body mass index (BMI) 35.0-35.9, adult: Secondary | ICD-10-CM | POA: Diagnosis not present

## 2016-08-02 DIAGNOSIS — M25552 Pain in left hip: Secondary | ICD-10-CM | POA: Insufficient documentation

## 2016-08-02 DIAGNOSIS — Z8249 Family history of ischemic heart disease and other diseases of the circulatory system: Secondary | ICD-10-CM | POA: Insufficient documentation

## 2016-08-02 DIAGNOSIS — R61 Generalized hyperhidrosis: Secondary | ICD-10-CM | POA: Insufficient documentation

## 2016-08-02 DIAGNOSIS — E119 Type 2 diabetes mellitus without complications: Secondary | ICD-10-CM | POA: Diagnosis not present

## 2016-08-02 DIAGNOSIS — R0609 Other forms of dyspnea: Secondary | ICD-10-CM | POA: Insufficient documentation

## 2016-08-02 DIAGNOSIS — E669 Obesity, unspecified: Secondary | ICD-10-CM | POA: Diagnosis not present

## 2016-08-02 DIAGNOSIS — I739 Peripheral vascular disease, unspecified: Secondary | ICD-10-CM

## 2016-08-02 DIAGNOSIS — R42 Dizziness and giddiness: Secondary | ICD-10-CM | POA: Insufficient documentation

## 2016-08-02 LAB — MYOCARDIAL PERFUSION IMAGING
LV dias vol: 92 mL (ref 62–150)
LV sys vol: 52 mL
Peak HR: 76 {beats}/min
Rest HR: 56 {beats}/min
SDS: 1
SRS: 3
SSS: 4
TID: 1.17

## 2016-08-02 MED ORDER — REGADENOSON 0.4 MG/5ML IV SOLN
0.4000 mg | Freq: Once | INTRAVENOUS | Status: AC
  Administered 2016-08-02: 0.4 mg via INTRAVENOUS

## 2016-08-02 MED ORDER — TECHNETIUM TC 99M TETROFOSMIN IV KIT
30.3000 | PACK | Freq: Once | INTRAVENOUS | Status: AC | PRN
  Administered 2016-08-02: 30.3 via INTRAVENOUS
  Filled 2016-08-02: qty 31

## 2016-08-02 MED ORDER — AMINOPHYLLINE 25 MG/ML IV SOLN
75.0000 mg | Freq: Once | INTRAVENOUS | Status: AC
  Administered 2016-08-02: 75 mg via INTRAVENOUS

## 2016-08-02 MED ORDER — TECHNETIUM TC 99M TETROFOSMIN IV KIT
11.0000 | PACK | Freq: Once | INTRAVENOUS | Status: AC | PRN
  Administered 2016-08-02: 11 via INTRAVENOUS
  Filled 2016-08-02: qty 11

## 2016-08-03 ENCOUNTER — Telehealth (HOSPITAL_COMMUNITY): Payer: Self-pay | Admitting: Cardiovascular Disease

## 2016-08-04 NOTE — Telephone Encounter (Signed)
Close encounter 

## 2016-08-10 ENCOUNTER — Ambulatory Visit (HOSPITAL_COMMUNITY)
Admission: RE | Admit: 2016-08-10 | Discharge: 2016-08-10 | Disposition: A | Discharge status: Home / Self Care | Payer: Medicare Other | Source: Ambulatory Visit | Attending: Cardiovascular Disease | Admitting: Cardiovascular Disease

## 2016-08-10 DIAGNOSIS — I10 Essential (primary) hypertension: Secondary | ICD-10-CM | POA: Diagnosis not present

## 2016-08-10 DIAGNOSIS — E119 Type 2 diabetes mellitus without complications: Secondary | ICD-10-CM | POA: Insufficient documentation

## 2016-08-10 DIAGNOSIS — R0602 Shortness of breath: Secondary | ICD-10-CM

## 2016-08-10 DIAGNOSIS — R0600 Dyspnea, unspecified: Secondary | ICD-10-CM | POA: Diagnosis present

## 2016-08-10 NOTE — Progress Notes (Signed)
  Echocardiogram 2D Echocardiogram has been performed.  Aggie Cosier 08/10/2016, 11:04 AM

## 2016-08-18 ENCOUNTER — Ambulatory Visit (INDEPENDENT_AMBULATORY_CARE_PROVIDER_SITE_OTHER): Payer: Medicare Other | Admitting: Cardiovascular Disease

## 2016-08-18 ENCOUNTER — Encounter: Payer: Self-pay | Admitting: Cardiovascular Disease

## 2016-08-18 VITALS — BP 142/80 | HR 66 | Ht 68.0 in | Wt 238.8 lb

## 2016-08-18 DIAGNOSIS — E7800 Pure hypercholesterolemia, unspecified: Secondary | ICD-10-CM

## 2016-08-18 DIAGNOSIS — N529 Male erectile dysfunction, unspecified: Secondary | ICD-10-CM

## 2016-08-18 DIAGNOSIS — I5032 Chronic diastolic (congestive) heart failure: Secondary | ICD-10-CM | POA: Diagnosis not present

## 2016-08-18 DIAGNOSIS — E668 Other obesity: Secondary | ICD-10-CM

## 2016-08-18 DIAGNOSIS — E119 Type 2 diabetes mellitus without complications: Secondary | ICD-10-CM

## 2016-08-18 DIAGNOSIS — I10 Essential (primary) hypertension: Secondary | ICD-10-CM | POA: Diagnosis not present

## 2016-08-18 DIAGNOSIS — I25118 Atherosclerotic heart disease of native coronary artery with other forms of angina pectoris: Secondary | ICD-10-CM | POA: Diagnosis not present

## 2016-08-18 DIAGNOSIS — E669 Obesity, unspecified: Secondary | ICD-10-CM

## 2016-08-18 DIAGNOSIS — E1169 Type 2 diabetes mellitus with other specified complication: Secondary | ICD-10-CM

## 2016-08-18 NOTE — Patient Instructions (Signed)
Dr Croitoru recommends that you schedule a follow-up appointment in 12 months. You will receive a reminder letter in the mail two months in advance. If you don't receive a letter, please call our office to schedule the follow-up appointment.  If you need a refill on your cardiac medications before your next appointment, please call your pharmacy. 

## 2016-08-18 NOTE — Progress Notes (Signed)
Cardiology Consultation Note    Date:  08/19/2016   ID:  Miguel Miller, DOB 1941/01/04, MRN [id number]  PCP:  Gennette Pac, MD  Cardiologist:   Sanda Klein, MD   Chief Complaint  Patient presents with  . Follow-up    History of Present Illness:  Miguel Miller is a 75 y.o. male with obesity, well-controlled type 2 diabetes mellitus, hyperlipidemia and hypertension referred for evaluation of exertional chest tightness and dyspnea as well as coronary calcification on CT.  He returns in follow-up after undergoing a nuclear stress test, echocardiogram and lower extremity arterial Dopplers.  The nuclear perfusion images were normal, but the study reported a slightly low ejection fraction of 49%. This was not confirmed by the echo which showed an ejection fraction in normal range. There were no regional wall motion abnormalities. Doppler study suggested diastolic dysfunction, but normal left atrial filling pressure. The left atrium was mildly dilated. Ankle-brachial indices were normal (1.27 bilaterally) suggesting no significant lower extremity arterial obstruction.  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 Over the last roughly year and a half he has been helping his wife go through treatment for breast cancer.  He has noticed a slowly progressive pattern of worsening chest tightness and dyspnea with physical activity. The symptoms never occur at rest. They're often accompanied by profuse diaphoresis. He does not have orthopnea or PND. He has also had recent problems with bilateral hip pain with walking. He describes a fairly predictable pattern of walking about 200 feet before he has to stop due to bilateral hip pain. He has previously undergone bilateral knee surgery and went to see his orthopedic surgeon Dr. Percell Miller. Apparently there are no problems with  the actual hip joints, but he received bilateral cortisone injections for bursitis. These have not helped. He does not have classic calf claudication. Miguel Miller has had several years of problems with erectile dysfunction. This did respond for a while to direct penile injections, but since about 2 years ago that the medication has had less effect.   He is very conscientious with his diet and tries to exercise. He has been less successful with this during his wife's illness, but recently has started working on losing weight again. His control is good without medication. Most recent hemoglobin A1c was 6.4%. His lipid profile is also favorable (total cholesterol 145, triglycerides 78, HDL 45, LDL 85). He has low testosterone levels are adequately compensated with AndroGel.  A workup to explain his 2-3 months of drenching night time sweats a chest CT demonstrated substantial atherosclerosis in the coronaries, aorta and aortic arch branches. It was also aortoiliac atherosclerosis. ESR and CRP were normal.  Past Medical History:  Diagnosis Date  . Actinic keratoses   . Arteriosclerosis   . BPH (benign prostatic hyperplasia)   . Diabetes mellitus type 2, controlled, without complications ([place])   . Diverticulosis   . DJD (degenerative joint disease)   . Dyslipidemia   . Eczema   . ED (erectile dysfunction)   . Essential tremor   . GERD (gastroesophageal reflux disease)   . Glaucoma    h/o retineal tear  . Hypertension   . Low testosterone   . Thrombocytopenia ([place])   . Thyroid goiter     Past Surgical History:  Procedure Laterality Date  . c-spine  1999   Dr. Annette Stable  .  CATARACT EXTRACTION  1997  . fusion L2  2012   Dr. Annette Stable  . HEMORRHOIDECTOMY WITH HEMORRHOID BANDING    . KNEE SURGERY Bilateral 2005   Dr. Berenice Primas  . l-spine  2003   Dr. Annette Stable  . laser surgery for holes in retina Right 08/2012  . outpatient right shoulder  2008   Dr. Percell Miller  . RETINAL DETACHMENT SURGERY  1996   Dr.  Glennon Mac  . retineal tear  11/2012   Dr. Darrick Grinder  . VASECTOMY      Current Medications: Outpatient Medications Prior to Visit  Medication Sig Dispense Refill  . Acetaminophen (TYLENOL PO) Take by mouth as needed.     [name] aspirin 81 MG tablet Take 81 mg by mouth daily.    [name] ezetimibe (ZETIA) 10 MG tablet Take 10 mg by mouth daily.    . finasteride (PROSCAR) 5 MG tablet Take 5 mg by mouth daily.    [name] lisinopril (PRINIVIL,ZESTRIL) 20 MG tablet Take 20 mg by mouth daily.    . Melatonin 5 MG TABS Take by mouth at bedtime.    [name] omeprazole (PRILOSEC) 20 MG capsule Take 20 mg by mouth daily as needed.    . Testosterone (ANDROGEL) 40.5 MG/2.5GM (1.62%) GEL Place onto the skin. 2 pumps daily    . zolpidem (AMBIEN) 10 MG tablet Take 10 mg by mouth at bedtime as needed for sleep.     No facility-administered medications prior to visit.      Allergies:   Hydrocodone bitartrate [hydrocodone]; Oxycodone; Statins; and Welchol [colesevelam hcl]   Social History   Social History  . Marital status: Married    Spouse name: Jeani Hawking  . Number of children: 2  . Years of education: 16+   Occupational History  . retired Pharmacist, hospital    Social History Main Topics  . Smoking status: Never Smoker  . Smokeless tobacco: Never Used  . Alcohol use Yes     Comment: rare  . Drug use: No  . Sexual activity: Not Asked   Other Topics Concern  . None   Social History Narrative   Patient lives with his wife.   Patient has 2 children.   Patient is retired.   Patient drinks 1 glass of caffeine daily.   Patient has a college education.     Family History:  The patient's family history includes Arthritis in his father; Diabetes in his brother; Emphysema in his father; Heart disease in his paternal grandfather; High blood pressure in his brother.   ROS:   Please see the history of present illness.    ROS All other systems reviewed and are negative.   PHYSICAL EXAM:   VS:  BP (!) 142/80   Pulse 66   Ht 5'  8" (1.727 m)   Wt 238 lb 12.8 oz (108.3 kg)   SpO2 98%   BMI 36.31 kg/m    GEN: Well nourished, well developed, in no acute distress  HEENT: normal  Neck: no JVD, carotid bruits, or masses Cardiac: RRR; no murmurs, rubs, or gallops,no edema ; he has bilateral 2+ radial ulnar and brachial pulses without subclavian bruits. He has bilateral femoral bruits. The femoral arteries and popliteals are difficult to palpate. He has 2+ right dorsalis pedis and 1+ right posterior tibial pulse. He has barely palpable left pedal pulses. Respiratory:  clear to auscultation bilaterally, normal work of breathing GI: soft, nontender, nondistended, + BS MS: no deformity or atrophy  Skin: warm and dry, no rash  Neuro:  Alert and Oriented x 3, Strength and sensation are intact. Essential tremor is especially obvious in his right thumb Psych: euthymic mood, full affect  Wt Readings from Last 3 Encounters:  08/18/16 238 lb 12.8 oz (108.3 kg)  08/02/16 233 lb (105.7 kg)  07/11/16 235 lb (106.6 kg)      Studies/Labs Reviewed:   EKG:  EKG is not ordered today.   Recent Labs:  hemoglobin A1c was 6.4%. Hgb 17.6, creatinine 1.17, normal liver function tests, potassium 5.0, no evidence of microalbuminuria   Lipid Panel  Total cholesterol 145, triglycerides 78, HDL 45, LDL 85  Additional studies/ records that were reviewed today include:  Reviewed images from his CT angiogram, with substantial calcification in all 3 coronaries especially obvious in the proximal to mid LAD. There is atherosclerosis in the distal aorta and proximal iliac arteries but at least in the segments viewed there does not appear to be any flow limiting stenosis.    ASSESSMENT:    1. Coronary artery disease involving native coronary artery of native heart with other form of angina pectoris ([place])   2. Chronic diastolic congestive heart failure (HCC)   3. Vasculogenic erectile dysfunction, unspecified vasculogenic erectile dysfunction  type   4. Essential hypertension   5. Diabetes mellitus type 2 in obese ([place])   6. Hypercholesterolemia   7. Moderate obesity      PLAN:  In order of problems listed above:  1. CAD: Despite evidence of coronary atherosclerosis with calcification on CT, he does not appear to have any major areas of myocardial ischemia. This does not exclude angina due to a small branch obstruction or due to microvascular disease in the setting of coronary risk factors. I suggested that the blocker that he takes on a "as needed basis" for essential tremor can be used on a daily basis as antianginal medication. At this point he does not think that his symptoms require a change in prescription.  2. CHF: He has NYHA functional class II exertional dyspnea with slow progression. Schedule for echocardiogram. No overt hypervolemia on physical exam and no evidence of elevated filling pressures at rest on echo. I don't think it is necessary to start diuretics at this time.  3. Erectile dysfunction:  Normal ABIs make it unlikely that we will be able to perform a vascular procedure that could improve  erectile dysfunction.  4. HTN: Well-controlled 5. DM type 2: Controlled with diet alone 6. HLP: He reports being tried on 6 different statins which all produced side effects. His lipid profile is acceptable on Zetia monotherapy. 7. Obesity: Congratulated on his efforts to lose weight. Has already paid off in his ability to control diabetes without medications. Needs to lose substantial more weight to move out of obese range.   Medication Adjustments/Labs and Tests Ordered: Current medicines are reviewed at length with the patient today.  Concerns regarding medicines are outlined above.  Medication changes, Labs and Tests ordered today are listed in the Patient Instructions below. Patient Instructions  Dr Sallyanne Kuster recommends that you schedule a follow-up appointment in 12 months. You will receive a reminder letter in the mail  two months in advance. If you don't receive a letter, please call our office to schedule the follow-up appointment.  If you need a refill on your cardiac medications before your next appointment, please call your pharmacy.    Signed, Sanda Klein, MD  08/19/2016 10:23 AM    [address],  [place], [place]  [address] Phone: [phone]; Fax: [phone]

## 2016-08-19 DIAGNOSIS — N529 Male erectile dysfunction, unspecified: Secondary | ICD-10-CM | POA: Insufficient documentation

## 2016-08-19 DIAGNOSIS — E668 Other obesity: Secondary | ICD-10-CM | POA: Insufficient documentation

## 2016-09-05 ENCOUNTER — Other Ambulatory Visit (HOSPITAL_COMMUNITY): Payer: Medicare Other

## 2016-09-07 ENCOUNTER — Telehealth: Payer: Self-pay | Admitting: Cardiovascular Disease

## 2016-09-07 ENCOUNTER — Other Ambulatory Visit: Payer: Self-pay | Admitting: Urology

## 2016-09-07 NOTE — Telephone Encounter (Signed)
Request for surgical clearance:  1. What type of surgery is being performed? TURP   2. When is this surgery scheduled? 10-02-16   3. Are there any medications that need to be held prior to surgery and how long?General Cardiac Clearance   4. Name of physician performing surgery? Dr Carolan Clines   5. What is your office phone and fax number?[phone] and fax is [phone]

## 2016-09-08 ENCOUNTER — Encounter: Payer: Self-pay | Admitting: Cardiovascular Disease

## 2016-09-08 NOTE — Telephone Encounter (Signed)
Sent via epic 

## 2016-09-08 NOTE — Telephone Encounter (Signed)
E-faxed and called [facility]. Spoke to [facility] and informed her this was sent, call if unreceived or further needs.

## 2016-09-29 ENCOUNTER — Encounter (HOSPITAL_BASED_OUTPATIENT_CLINIC_OR_DEPARTMENT_OTHER): Payer: Self-pay | Admitting: *Deleted

## 2016-09-29 NOTE — Progress Notes (Signed)
Pt instructed npo pmn 1/28 x inderal w sip of water.  To [facility] 1/29 @ 0715.  Needs istat on arrival.  ekg in chart.

## 2016-09-29 NOTE — Progress Notes (Signed)
   09/29/16 1533  OBSTRUCTIVE SLEEP APNEA  Have you ever been diagnosed with sleep apnea through a sleep study? No  Do you snore loudly (loud enough to be heard through closed doors)?  0  Do you often feel tired, fatigued, or sleepy during the daytime (such as falling asleep during driving or talking to someone)? 0  Has anyone observed you stop breathing during your sleep? 0  Do you have, or are you being treated for high blood pressure? 1  BMI more than 35 kg/m2? 1  Age > 50 (1-yes) 1  Neck circumference greater than:Male 16 inches or larger, Male 17inches or larger? 1  Male Gender (Yes=1) 1  Obstructive Sleep Apnea Score 5

## 2016-10-01 NOTE — H&P (Signed)
Office Visit Report     06/06/2016   --------------------------------------------------------------------------------   Miguel Miller  MRN: [id number]  PRIMARY CARE:  Priscille Heidelberg. Little, MD  DOB: [birth year], 76 year old Male  REFERRING:  Lennette Bihari L. Little, MD  SSN: [id number]  PROVIDER:  Carolan Clines, M.D.    LOCATION:  [facility] [phone]   --------------------------------------------------------------------------------   CC: BPH  HPI: Miguel Miller is a 76 year-old male established patient who is here for follow up regarding further evaluation of BPH and lower urinary tract symptoms.  The patient complains of lower urinary tract symptom(s) that include frequency, urgency, hesitancy, weak stream, intermittency, straining, nocturia, and sense of incomplete emptying. Patient is currently treated with Finasteride for his symptoms. The patient states if he were to spend the rest of his life with his current urinary condition, he would be mixed.     CC: interval evaluation of low testosterone  HPI: The patient was started on testosterone replacement therapy on. The patient was last seen 12/21/2015. The patient is currently taking Androgel for his testosterone replacement therapy.     CC: I have erectile dysfunction (Meds).  HPI: He is currently on Trimix for treatment of his erectile dysfunction. His medication does not allow him to achieve good erections. He does have problems with erections.   He does have difficulties achieving an erection.   He is only getting partial erections with Trimix injections.     AUA Symptom Score: 50% of the time he has the sensation of not emptying his bladder completely when finished urinating. 50% of the time he has to urinate again fewer than two hours after he has finished urinating. More than 50% of the time he has to start and stop again several times when he urinates. Less than 20% of the time he finds it difficult to  postpone urination. More than 50% of the time he has a weak urinary stream. 50% of the time he has to push or strain to begin urination. He has to get up to urinate 2 times from the time he goes to bed until the time he gets up in the morning.   Calculated AUA Symptom Score: 20    ALLERGIES: Hydrocodone-Acetaminophen CAPS Statins     MEDICATIONS: Acetaminophen CAPS 0 Oral  Ambien 10 MG Oral Tablet Oral  AndroGel Pump 20.25 MG/ACT (1.62%) Transdermal Gel 0 Transdermal  Aspirin 81 MG TABS Oral  DiazePAM 5 MG Oral Tablet 0 Oral  Dilaudid 2 MG Oral Tablet 0 Oral  Finasteride 5 MG Oral Tablet 0 Oral  Lisinopril 20 MG Oral Tablet Oral  Melatonin TABS Oral  Trimix (PGE 40, PAP 30, PHE 0.5)  Zetia 10 MG Oral Tablet Oral     GU PSH: Vasectomy - 2011      PSH Notes: Cataract Surgery, Neck Surgery, Shoulder Surgery, Repair Of Retinal Detachment, Back Surgery   NON-GU PSH: Revise Knee Joint - 2011    GU PMH: BPH w/LUTS, Benign prostatic hyperplasia with urinary obstruction - 12/21/2015 ED, arterial insufficiency, Erectile dysfunction due to arterial insufficiency - 12/21/2015, Erectile dysfunction due to arterial insufficiency, - 12/21/2015 Testicular hypofunction, Hypogonadism, testicular - 12/21/2015 Gross hematuria, Gross hematuria - 06/11/2015 Peyronies Disease, 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/27/2015 Urinary Frequency, Urinary frequency - 01/27/2015      PMH Notes:  2009-11-29 13:22:32 - Note: Arthritis   NON-GU PMH: Encounter for general adult medical examination without abnormal findings, Encounter for preventive health examination -  2014 Anxiety, Anxiety - 2014 Personal history of other diseases of the circulatory system, History of hypertension - 2014 Personal history of other diseases of the digestive system, History of esophageal reflux - 2014 Personal history of other endocrine, nutritional and metabolic disease, History of hypercholesterolemia - 2014, History of  diabetes mellitus, - 2014    FAMILY HISTORY: Cancer - Mother Diabetes - Mother Family Health Status Number - Runs In Family Father Deceased At [place] ___ - Runs In Family Heart Disease - Grandfather Hypertension - Mother Mother Deceased At Age 25 from diabetic complicati - Runs In Family Prostate Cancer - Grandfather   SOCIAL HISTORY: Marital Status: Married Current Smoking Status: Patient has never smoked.  Social Drinker.  Drinks 1 caffeinated drink per day.    REVIEW OF SYSTEMS:    GU Review Male:   Patient reports frequent urination, get up at night to urinate, stream starts and stops, and erection problems. Patient denies hard to postpone urination, burning/ pain with urination, leakage of urine, trouble starting your stream, have to strain to urinate , and penile pain.  Gastrointestinal (Upper):   Patient reports indigestion/ heartburn. Patient denies nausea and vomiting.  Gastrointestinal (Lower):   Patient denies diarrhea and constipation.  Constitutional:   Patient reports night sweats. Patient denies fever, weight loss, and fatigue.  Skin:   Patient denies skin rash/ lesion and itching.  Eyes:   Patient denies blurred vision and double vision.  Ears/ Nose/ Throat:   Patient denies sore throat and sinus problems.  Hematologic/Lymphatic:   Patient denies swollen glands and easy bruising.  Cardiovascular:   Patient denies leg swelling and chest pains.  Respiratory:   Patient denies cough and shortness of breath.  Endocrine:   Patient denies excessive thirst.  Musculoskeletal:   Patient denies back pain and joint pain.  Neurological:   Patient denies headaches and dizziness.  Psychologic:   Patient denies depression and anxiety.   VITAL SIGNS:      06/06/2016 09:42 AM  BP 161/85 mmHg  Pulse 75 /min  Temperature 98.2 F / 37 C   GU PHYSICAL EXAMINATION:    Anus and Perineum: No hemorrhoids. No anal stenosis. No rectal fissure, no anal fissure. No edema, no dimple, no  perineal tenderness, no anal tenderness.  Scrotum: No lesions. No edema. No cysts. No warts.  Epididymides: Right: no spermatocele, no masses, no cysts, no tenderness, no induration, no enlargement. Left: no spermatocele, no masses, no cysts, no tenderness, no induration, no enlargement.  Testes: No tenderness, no swelling, no enlargement left testes. No tenderness, no swelling, no enlargement right testes. Normal location left testes. Normal location right testes. No mass, no cyst, no varicocele, no hydrocele left testes. No mass, no cyst, no varicocele, no hydrocele right testes.  Urethral Meatus: Normal size. No lesion, no wart, no discharge, no polyp. Normal location.  Penis: Circumcised, no warts, no cracks. No dorsal Peyronie's plaques, no left corporal Peyronie's plaques, no right corporal Peyronie's plaques, no scarring, no warts. No balanitis, no meatal stenosis.  Prostate: 40 gram or 2+ size. Left lobe normal consistency, right lobe normal consistency. Symmetrical lobes. No prostate nodule. Left lobe no tenderness, right lobe no tenderness.  Seminal Vesicles: Nonpalpable.  Sphincter Tone: Normal sphincter. No rectal tenderness. No rectal mass.    MULTI-SYSTEM PHYSICAL EXAMINATION:    Constitutional: Well-nourished. No physical deformities. Normally developed. Good grooming.  Neck: Neck symmetrical, not swollen. Normal tracheal position.  Respiratory: No labored breathing, no use of accessory muscles.  Cardiovascular: Normal temperature, normal extremity pulses, no swelling, no varicosities.  Lymphatic: No enlargement of neck, axillae, groin.  Skin: No paleness, no jaundice, no cyanosis. No lesion, no ulcer, no rash.  Neurologic / Psychiatric: Oriented to time, oriented to place, oriented to person. No depression, no anxiety, no agitation.  Gastrointestinal: No mass, no tenderness, no rigidity, non obese abdomen.  Eyes: Normal conjunctivae. Normal eyelids.  Ears, Nose, Mouth, and  Throat: Left ear no scars, no lesions, no masses. Right ear no scars, no lesions, no masses. Nose no scars, no lesions, no masses. Normal hearing. Normal lips.  Musculoskeletal: Normal gait and station of head and neck.     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 He has been treated with finasteride, has had increasing bladder outlet symptoms. The prostate size of 56.28 g by ultrasound 06/09/15, with no median lobe. He returns today for flow rate,  which shows a peak flow of only 8 cc/s, average flow of 4 cc/s. His voided volume is 316 cc with a postvoid 83 cc. His international prostate symptom score sheet = 20/7, with symptoms of intermittency 4/5, weak stream x 4/5, with straining 3/5, frequency 3/5, incomplete emptying 3/5, nocturia  X 2/5, and urgency 1/5. His quality of life is 3/6 (mixed).   We have discussed treatment options, including continued medical therapy, Urolift procedure, thulium laser therapy, and TURP. His wife has just finished breast cancer therapy, and is reported to have revision of her surgical incision. They have returned from  [place] to visit friends for the  holidays, and is ready for his surgery.  He has not taken medications such as Sudafed, or sleeping medication such as Tylenol p.m. (contains Benadryl), because he is at risk for recurrent urinary retention.   The patient understands patient, we'll have a catheter for 2-3 days. In addition, he will probably have retrograde ejaculation after his surgery, and we have discussed the differences between UROLIFT, and laser therapy, and TURP. He has decided to not have to Urolift because he may need another procedure in 5 years. Rather, he will have the laser therapy. Which  Has the p[romise of a  longer lasting effect. He understands we'll probably have to have another treatment in is 90s, however. In addition, he knows he will have retrograde ejaculation, and his alreadyhaving difficulty with trimix, not allowing for as good erection as  it has previously.   The patient has had examination by Dr. Rex Kras for physical exam, complaining of some forceps, and some chest discomfort. He has had negative EKG, and chest x-ray. He will be seen this week review of physical exam, and when asked Dr. Jilda Roche for any results.   PLAN:           Schedule Return Visit: Next Available Appointment - Schedule Surgery             Note: Scheduled surgery for Jan. 2018          Document Letter(s):  Created for Patient: Clinical Summary         Notes:   surgery with Thullium laser      The information contained in this medical record document is considered private and confidential patient information. This information can only be used for the medical diagnosis and/or medical services that are being provided by the patient's selected caregivers. This information can only be distributed outside of the patient's care if the patient agrees and signs waivers of authorization for this information to be sent to an outside source or route.

## 2016-10-02 ENCOUNTER — Ambulatory Visit (HOSPITAL_BASED_OUTPATIENT_CLINIC_OR_DEPARTMENT_OTHER): Payer: Medicare Other | Admitting: Anesthesiology

## 2016-10-02 ENCOUNTER — Ambulatory Visit (HOSPITAL_BASED_OUTPATIENT_CLINIC_OR_DEPARTMENT_OTHER)
Admission: RE | Admit: 2016-10-02 | Discharge: 2016-10-02 | Disposition: A | Discharge status: Home / Self Care | Payer: Medicare Other | Source: Ambulatory Visit | Attending: Urology | Admitting: Urology

## 2016-10-02 ENCOUNTER — Encounter (HOSPITAL_BASED_OUTPATIENT_CLINIC_OR_DEPARTMENT_OTHER)
Admission: RE | Disposition: A | Discharge status: Home / Self Care | Payer: Self-pay | Source: Ambulatory Visit | Attending: Urology

## 2016-10-02 ENCOUNTER — Encounter (HOSPITAL_BASED_OUTPATIENT_CLINIC_OR_DEPARTMENT_OTHER): Payer: Self-pay | Admitting: *Deleted

## 2016-10-02 DIAGNOSIS — N5201 Erectile dysfunction due to arterial insufficiency: Secondary | ICD-10-CM | POA: Insufficient documentation

## 2016-10-02 DIAGNOSIS — N401 Enlarged prostate with lower urinary tract symptoms: Secondary | ICD-10-CM | POA: Diagnosis not present

## 2016-10-02 DIAGNOSIS — I10 Essential (primary) hypertension: Secondary | ICD-10-CM | POA: Insufficient documentation

## 2016-10-02 DIAGNOSIS — Z7982 Long term (current) use of aspirin: Secondary | ICD-10-CM | POA: Diagnosis not present

## 2016-10-02 DIAGNOSIS — R338 Other retention of urine: Secondary | ICD-10-CM | POA: Diagnosis not present

## 2016-10-02 DIAGNOSIS — E7800 Pure hypercholesterolemia, unspecified: Secondary | ICD-10-CM | POA: Insufficient documentation

## 2016-10-02 DIAGNOSIS — K219 Gastro-esophageal reflux disease without esophagitis: Secondary | ICD-10-CM | POA: Diagnosis not present

## 2016-10-02 DIAGNOSIS — E119 Type 2 diabetes mellitus without complications: Secondary | ICD-10-CM | POA: Insufficient documentation

## 2016-10-02 DIAGNOSIS — Z79899 Other long term (current) drug therapy: Secondary | ICD-10-CM | POA: Insufficient documentation

## 2016-10-02 LAB — POCT I-STAT, CHEM 8
BUN: 23 mg/dL — ABNORMAL HIGH (ref 6–20)
Calcium, Ion: 1.17 mmol/L (ref 1.15–1.40)
Chloride: 102 mmol/L (ref 101–111)
Creatinine, Ser: 1 mg/dL (ref 0.61–1.24)
Glucose, Bld: 141 mg/dL — ABNORMAL HIGH (ref 65–99)
HCT: 47 % (ref 39.0–52.0)
Hemoglobin: 16 g/dL (ref 13.0–17.0)
Potassium: 4 mmol/L (ref 3.5–5.1)
Sodium: 140 mmol/L (ref 135–145)
TCO2: 25 mmol/L (ref 0–100)

## 2016-10-02 LAB — GLUCOSE, CAPILLARY: Glucose-Capillary: 137 mg/dL — ABNORMAL HIGH (ref 65–99)

## 2016-10-02 SURGERY — THULIUM LASER TURP (TRANSURETHRAL RESECTION OF PROSTATE)
Anesthesia: General | Site: Bladder

## 2016-10-02 MED ORDER — SODIUM CHLORIDE 0.9 % IR SOLN
Status: DC | PRN
  Administered 2016-10-02: 9000 mL

## 2016-10-02 MED ORDER — FENTANYL CITRATE (PF) 100 MCG/2ML IJ SOLN
25.0000 ug | INTRAMUSCULAR | Status: DC | PRN
  Administered 2016-10-02 (×2): 50 ug via INTRAVENOUS
  Filled 2016-10-02: qty 1

## 2016-10-02 MED ORDER — ONDANSETRON HCL 4 MG/2ML IJ SOLN
INTRAMUSCULAR | Status: AC
  Filled 2016-10-02: qty 2

## 2016-10-02 MED ORDER — LACTATED RINGERS IV SOLN
INTRAVENOUS | Status: DC
  Administered 2016-10-02 (×2): via INTRAVENOUS
  Filled 2016-10-02: qty 1000

## 2016-10-02 MED ORDER — EPHEDRINE 5 MG/ML INJ
INTRAVENOUS | Status: AC
  Filled 2016-10-02: qty 10

## 2016-10-02 MED ORDER — DEXAMETHASONE SODIUM PHOSPHATE 4 MG/ML IJ SOLN
INTRAMUSCULAR | Status: DC | PRN
  Administered 2016-10-02: 5 mg via INTRAVENOUS

## 2016-10-02 MED ORDER — PROMETHAZINE HCL 25 MG/ML IJ SOLN
6.2500 mg | INTRAMUSCULAR | Status: DC | PRN
  Filled 2016-10-02: qty 1

## 2016-10-02 MED ORDER — CEFAZOLIN SODIUM-DEXTROSE 2-4 GM/100ML-% IV SOLN
INTRAVENOUS | Status: AC
  Filled 2016-10-02: qty 100

## 2016-10-02 MED ORDER — LIDOCAINE HCL 2 % EX GEL
CUTANEOUS | Status: DC | PRN
  Administered 2016-10-02: 1

## 2016-10-02 MED ORDER — FENTANYL CITRATE (PF) 100 MCG/2ML IJ SOLN
INTRAMUSCULAR | Status: AC
  Filled 2016-10-02: qty 2

## 2016-10-02 MED ORDER — ACETAMINOPHEN 10 MG/ML IV SOLN
INTRAVENOUS | Status: DC | PRN
  Administered 2016-10-02: 1000 mg via INTRAVENOUS

## 2016-10-02 MED ORDER — PROPOFOL 10 MG/ML IV BOLUS
INTRAVENOUS | Status: AC
  Filled 2016-10-02: qty 40

## 2016-10-02 MED ORDER — ONDANSETRON HCL 4 MG/2ML IJ SOLN
INTRAMUSCULAR | Status: DC | PRN
  Administered 2016-10-02: 4 mg via INTRAVENOUS

## 2016-10-02 MED ORDER — HYOSCYAMINE SULFATE 0.125 MG SL SUBL
0.1250 mg | SUBLINGUAL_TABLET | Freq: Once | SUBLINGUAL | Status: AC
  Administered 2016-10-02: 0.125 mg via SUBLINGUAL
  Filled 2016-10-02: qty 1

## 2016-10-02 MED ORDER — DEXAMETHASONE SODIUM PHOSPHATE 10 MG/ML IJ SOLN
INTRAMUSCULAR | Status: AC
  Filled 2016-10-02: qty 1

## 2016-10-02 MED ORDER — CEFAZOLIN SODIUM-DEXTROSE 2-4 GM/100ML-% IV SOLN
2.0000 g | INTRAVENOUS | Status: AC
  Administered 2016-10-02: 2 g via INTRAVENOUS
  Filled 2016-10-02: qty 100

## 2016-10-02 MED ORDER — ACETAMINOPHEN 10 MG/ML IV SOLN
INTRAVENOUS | Status: AC
  Filled 2016-10-02: qty 100

## 2016-10-02 MED ORDER — PROPOFOL 10 MG/ML IV BOLUS
INTRAVENOUS | Status: DC | PRN
  Administered 2016-10-02: 100 mg via INTRAVENOUS
  Administered 2016-10-02: 200 mg via INTRAVENOUS

## 2016-10-02 MED ORDER — LIDOCAINE HCL 2 % EX GEL
CUTANEOUS | Status: AC
  Filled 2016-10-02: qty 5

## 2016-10-02 MED ORDER — OXYCODONE HCL 5 MG PO TABS
5.0000 mg | ORAL_TABLET | Freq: Once | ORAL | Status: DC
  Filled 2016-10-02: qty 1

## 2016-10-02 MED ORDER — LIDOCAINE HCL (CARDIAC) 20 MG/ML IV SOLN
INTRAVENOUS | Status: DC | PRN
  Administered 2016-10-02: 80 mg via INTRAVENOUS

## 2016-10-02 MED ORDER — KETOROLAC TROMETHAMINE 30 MG/ML IJ SOLN
INTRAMUSCULAR | Status: DC | PRN
  Administered 2016-10-02: 30 mg via INTRAVENOUS

## 2016-10-02 MED ORDER — EPHEDRINE SULFATE 50 MG/ML IJ SOLN
INTRAMUSCULAR | Status: DC | PRN
  Administered 2016-10-02 (×3): 10 mg via INTRAVENOUS

## 2016-10-02 MED ORDER — BELLADONNA ALKALOIDS-OPIUM 16.2-60 MG RE SUPP
RECTAL | Status: AC
  Filled 2016-10-02: qty 1

## 2016-10-02 MED ORDER — TRAMADOL HCL 50 MG PO TABS: ORAL_TABLET | ORAL | 0 refills | Status: DC

## 2016-10-02 MED ORDER — FENTANYL CITRATE (PF) 100 MCG/2ML IJ SOLN
INTRAMUSCULAR | Status: DC | PRN
  Administered 2016-10-02: 50 ug via INTRAVENOUS
  Administered 2016-10-02 (×2): 25 ug via INTRAVENOUS

## 2016-10-02 MED ORDER — LIDOCAINE 2% (20 MG/ML) 5 ML SYRINGE
INTRAMUSCULAR | Status: AC
  Filled 2016-10-02: qty 5

## 2016-10-02 MED ORDER — HYOSCYAMINE SULFATE 0.125 MG SL SUBL
SUBLINGUAL_TABLET | SUBLINGUAL | Status: AC
  Filled 2016-10-02: qty 1

## 2016-10-02 MED ORDER — GLYCOPYRROLATE 0.2 MG/ML IJ SOLN
INTRAMUSCULAR | Status: DC | PRN
  Administered 2016-10-02: 0.2 mg via INTRAVENOUS

## 2016-10-02 SURGICAL SUPPLY — 26 items
BAG DRAIN URO-CYSTO SKYTR STRL (DRAIN) ×2 IMPLANT
BAG URINE DRAINAGE (UROLOGICAL SUPPLIES) ×2 IMPLANT
CATH COUDE FOLEY 2W 5CC 18FR (CATHETERS) IMPLANT
CATH FOLEY 2WAY SLVR  5CC 18FR (CATHETERS)
CATH FOLEY 2WAY SLVR 30CC 20FR (CATHETERS) ×2 IMPLANT
CATH FOLEY 2WAY SLVR 5CC 18FR (CATHETERS) IMPLANT
CATH FOLEY 3WAY 30CC 22F (CATHETERS) IMPLANT
CLOTH BEACON ORANGE TIMEOUT ST (SAFETY) ×2 IMPLANT
ELECT BIVAP BIPO 22/24 DONUT (ELECTROSURGICAL)
ELECT LOOP MED HF 24F 12D (CUTTING LOOP) IMPLANT
ELECTRD BIVAP BIPO 22/24 DONUT (ELECTROSURGICAL) IMPLANT
GLOVE BIO SURGEON STRL SZ7.5 (GLOVE) ×4 IMPLANT
GOWN STRL REUS W/ TWL XL LVL3 (GOWN DISPOSABLE) ×1 IMPLANT
GOWN STRL REUS W/TWL XL LVL3 (GOWN DISPOSABLE) ×1
HOLDER FOLEY CATH W/STRAP (MISCELLANEOUS) IMPLANT
IV NS IRRIG 3000ML ARTHROMATIC (IV SOLUTION) ×2 IMPLANT
IV SET EXTENSION GRAVITY 40 LF (IV SETS) ×2 IMPLANT
KIT ROOM TURNOVER WOR (KITS) ×2 IMPLANT
LASER FIBER ×2 IMPLANT
LASER REVOLIX PROCEDURE (MISCELLANEOUS) ×2 IMPLANT
LOOP CUT BIPOLAR 24F LRG (ELECTROSURGICAL) IMPLANT
MANIFOLD NEPTUNE II (INSTRUMENTS) ×2 IMPLANT
PACK CYSTO (CUSTOM PROCEDURE TRAY) ×2 IMPLANT
SYR 30ML LL (SYRINGE) ×2 IMPLANT
SYRINGE IRR TOOMEY STRL 70CC (SYRINGE) IMPLANT
TUBE CONNECTING 12X1/4 (SUCTIONS) ×2 IMPLANT

## 2016-10-02 NOTE — Anesthesia Procedure Notes (Signed)
Procedure Name: LMA Insertion Date/Time: 10/02/2016 8:58 AM Performed by: Justice Rocher Pre-anesthesia Checklist: Patient identified, Emergency Drugs available, Suction available and Patient being monitored Patient Re-evaluated:Patient Re-evaluated prior to inductionOxygen Delivery Method: Circle system utilized Preoxygenation: Pre-oxygenation with 100% oxygen Intubation Type: IV induction Ventilation: Mask ventilation without difficulty LMA: LMA inserted LMA Size: 5.0 Number of attempts: 1 Airway Equipment and Method: Bite block Placement Confirmation: positive ETCO2 and breath sounds checked- equal and bilateral Tube secured with: Tape Dental Injury: Teeth and Oropharynx as per pre-operative assessment

## 2016-10-02 NOTE — Op Note (Addendum)
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 Carolan Clines  RESIDENT: Dr. Virginia Crews  ANESTHESIA:  General.  SPECIMEN:  None.  DRAINS:  20-French Coude Foley catheter.  BLOOD LOSS:  [id number]  COMPLICATIONS:  None.  INDICATIONS:  [name] is a 76 year old white male with BPH who has failed medical therapy.  He is to undergo laser vaporization of the prostate. Risks and benefits discussed, written informed consent obtained.  FINDINGS OF PROCEDURE:  He was given Ancef.  He was taken to the operating room where general anesthetic was induced.  He was placed in lithotomy position and fitted with PAS hose.  His perineum and genitalia were prepped with Betadine solution and draped in usual sterile fashion.  Cystoscopy was performed using a 23-French scope and 30-degree lens. Examination revealed a normal urethra.  The external sphincter was intact.  The prostatic urethra was approximately 3 to 4 cm in length with trilobar hyperplasia.  The middle lobe was small.  The ureteral orifices were well away from the bladder neck.  There was mild trabeculation.   No stones were seen.  After thorough inspection, the continuous-flow laser scope was placed and was fitted with 600 micron Thulium laser fiber.  The power was set on 100.  The middle lobe was then vaporized from 5 to 7 o'clock and the floor of the prostate was then vaporized.  The power was increased to 110.  Additional vaporization of the floor was performed followed by vaporization of the right lateral lobe from bladder neck to apex followed by vaporization of the left lateral lobe from bladder neck to apex.  Once an adequate channel was created the floor of the prostate was further vaporized out to alongside the verumontanum.  Residual apical and anterior tissue  were vaporized.  At this point, a very substantial channel had been created.  Final inspection was performed, and the bladder was evacuated free of the debris.  The bladder was refilled, and the scope was removed. A 20Fr coude foley catheter was passed after experiencing some resistance with a 22Fr straight foley catheter. 8ml of sterile water was placed in the balloon. The patient was then taken down from lithotomy position.  His anesthetic was reversed.  He was moved to recovery room in stable condition.  There were no complications.  Dr. Gaynelle Arabian was present and scrubbed for the entire procedure.  Post-op plan: 1) Patient to have foley removed in 2-3 days with trial of void

## 2016-10-02 NOTE — Interval H&P Note (Signed)
History and Physical Interval Note:  10/02/2016 7:39 AM  Miguel Miller  has presented today for surgery, with the diagnosis of BENIGN PROSTATIC HYPERPLASIA  The various methods of treatment have been discussed with the patient and family. After consideration of risks, benefits and other options for treatment, the patient has consented to  Procedure(s): THULIUM LASER TURP (TRANSURETHRAL RESECTION OF PROSTATE) (N/A) as a surgical intervention .  The patient's history has been reviewed, patient examined, no change in status, stable for surgery.  I have reviewed the patient's chart and labs.  Questions were answered to the patient's satisfaction.     [name] I [name]  Pt seen and surgery discussed with pt and also with wife. He is concerned with retrograde ejaculation, that he had with tamsulosin, and he understands that primary objective is to allow him to void with lower IPSS, and that he has a 50% chance of retrograde ejaculation with his prostate surgery.   He is concerned with catheterization, and I have explained that he will have a catheter placed intra-operatively, to be left for 2-3 days post-operatively, and he will RTC for voiding trial.

## 2016-10-02 NOTE — Discharge Instructions (Addendum)
May take Tylenol,Motrin as need at home instead of Tramadol.   Post Anesthesia Home Care Instructions  Activity: Get plenty of rest for the remainder of the day. A responsible adult should stay with you for 24 hours following the procedure.  For the next 24 hours, DO NOT: -Drive a car -Paediatric nurse -Drink alcoholic beverages -Take any medication unless instructed by your physician -Make any legal decisions or sign important papers.  Meals: Start with liquid foods such as gelatin or soup. Progress to regular foods as tolerated. Avoid greasy, spicy, heavy foods. If nausea and/or vomiting occur, drink only clear liquids until the nausea and/or vomiting subsides. Call your physician if vomiting continues.  Special Instructions/Symptoms: Your throat may feel dry or sore from the anesthesia or the breathing tube placed in your throat during surgery. If this causes discomfort, gargle with warm salt water. The discomfort should disappear within 24 hours.  If you had a scopolamine patch placed behind your ear for the management of post- operative nausea and/or vomiting:  1. The medication in the patch is effective for 72 hours, after which it should be removed.  Wrap patch in a tissue and discard in the trash. Wash hands thoroughly with soap and water. 2. You may remove the patch earlier than 72 hours if you experience unpleasant side effects which may include dry mouth, dizziness or visual disturbances. 3. Avoid touching the patch. Wash your hands with soap and water after contact with the patch.

## 2016-10-02 NOTE — Transfer of Care (Signed)
Immediate Anesthesia Transfer of Care Note  Patient: Miguel Miller  Procedure(s) Performed: Procedure(s) (LRB): THULIUM LASER TURP (TRANSURETHRAL RESECTION OF PROSTATE) (N/A)  Patient Location: PACU  Anesthesia Type: General  Level of Consciousness: awake, sedated, patient cooperative and responds to stimulation  Airway & Oxygen Therapy: Patient Spontanous Breathing and Patient connected to [place] oxygen  Post-op Assessment: Report given to PACU RN, Post -op Vital signs reviewed and stable and Patient moving all extremities  Post vital signs: Reviewed and stable  Complications: No apparent anesthesia complications

## 2016-10-02 NOTE — Anesthesia Preprocedure Evaluation (Signed)
Anesthesia Evaluation  Patient identified by MRN, date of birth, ID band Patient awake    Reviewed: Allergy & Precautions, NPO status , Patient's Chart, lab work & pertinent test results  Airway Mallampati: II  TM Distance: >3 FB Neck ROM: Full    Dental no notable dental hx.    Pulmonary neg pulmonary ROS,    Pulmonary exam normal breath sounds clear to auscultation       Cardiovascular hypertension, Normal cardiovascular exam Rhythm:Regular Rate:Normal  - Normal LV size and systolic function, EF [id number]. Normal RV size and   systolic function. No significant valvular abnormalities. 08/2016   Neuro/Psych negative neurological ROS  negative psych ROS   GI/Hepatic negative GI ROS, Neg liver ROS,   Endo/Other  diabetes  Renal/GU negative Renal ROS  negative genitourinary   Musculoskeletal negative musculoskeletal ROS (+)   Abdominal   Peds negative pediatric ROS (+)  Hematology negative hematology ROS (+)   Anesthesia Other Findings   Reproductive/Obstetrics negative OB ROS                             Anesthesia Physical Anesthesia Plan  ASA: III  Anesthesia Plan: General   Post-op Pain Management:    Induction: Intravenous  Airway Management Planned: LMA  Additional Equipment:   Intra-op Plan:   Post-operative Plan:   Informed Consent: I have reviewed the patients History and Physical, chart, labs and discussed the procedure including the risks, benefits and alternatives for the proposed anesthesia with the patient or authorized representative who has indicated his/her understanding and acceptance.   Dental advisory given  Plan Discussed with: CRNA and Surgeon  Anesthesia Plan Comments:         Anesthesia Quick Evaluation

## 2016-10-03 NOTE — Anesthesia Postprocedure Evaluation (Addendum)
Anesthesia Post Note  Patient: Miguel Miller  Procedure(s) Performed: Procedure(s) (LRB): THULIUM LASER TURP (TRANSURETHRAL RESECTION OF PROSTATE) (N/A)  Patient location during evaluation: PACU Anesthesia Type: General Level of consciousness: awake and alert Pain management: pain level controlled Vital Signs Assessment: post-procedure vital signs reviewed and stable Respiratory status: spontaneous breathing, nonlabored ventilation, respiratory function stable and patient connected to nasal cannula oxygen Cardiovascular status: blood pressure returned to baseline and stable Postop Assessment: no signs of nausea or vomiting Anesthetic complications: no       Last Vitals:  Vitals:   10/02/16 1100 10/02/16 1153  BP: (!) 146/84 (!) 160/74  Pulse: 66 69  Resp: 18 16  Temp:  36.5 C    Last Pain:  Vitals:   10/03/16 1147  TempSrc:   PainSc: 5                  Mansour Balboa

## 2016-12-04 ENCOUNTER — Other Ambulatory Visit: Payer: Self-pay | Admitting: Family Medicine

## 2016-12-04 DIAGNOSIS — R935 Abnormal findings on diagnostic imaging of other abdominal regions, including retroperitoneum: Secondary | ICD-10-CM

## 2016-12-11 ENCOUNTER — Ambulatory Visit
Admission: RE | Admit: 2016-12-11 | Discharge: 2016-12-11 | Disposition: A | Discharge status: Home / Self Care | Payer: Medicare Other | Source: Ambulatory Visit | Attending: Family Medicine | Admitting: Family Medicine

## 2016-12-11 DIAGNOSIS — R935 Abnormal findings on diagnostic imaging of other abdominal regions, including retroperitoneum: Secondary | ICD-10-CM

## 2017-01-08 ENCOUNTER — Ambulatory Visit (INDEPENDENT_AMBULATORY_CARE_PROVIDER_SITE_OTHER): Payer: Medicare Other | Admitting: Endocrinology

## 2017-01-08 ENCOUNTER — Encounter: Payer: Self-pay | Admitting: Endocrinology

## 2017-01-08 ENCOUNTER — Ambulatory Visit: Payer: Medicare Other | Admitting: Endocrinology

## 2017-01-08 DIAGNOSIS — E042 Nontoxic multinodular goiter: Secondary | ICD-10-CM

## 2017-01-08 NOTE — Patient Instructions (Addendum)
Let's recheck the ultrasound.  you will receive a phone call, about a day and time for an appointment Please come back for a follow-up appointment in 1 year most of the time, a "lumpy thyroid" will eventually become overactive.  this is usually a slow process, happening over the span of many years.

## 2017-01-08 NOTE — Progress Notes (Signed)
Subjective:    Patient ID: Miguel Miller, male    DOB: 03-29-1941, 76 y.o.   MRN: [id number]  HPI Pt returns for f/u of multinodular goiter (dx'ed 2017, incidentally on CT; he reported diaphoresis--urine catechols and calcitonin were normal; bx of dominant LLP nodule was Beth cat 2).  pt states he feels well in general.  He does not notice the goiter. Past Medical History:  Diagnosis Date  . Actinic keratoses   . Arteriosclerosis   . BPH (benign prostatic hyperplasia)   . Diabetes mellitus type 2, controlled, without complications ([place])    diet controlled  . Diverticulosis   . DJD (degenerative joint disease)   . Dyslipidemia   . Eczema   . ED (erectile dysfunction)   . Essential tremor   . GERD (gastroesophageal reflux disease)   . Glaucoma    h/o retineal tear; pt denies glaucoma(09/29/16)  . Hypertension   . Low testosterone   . Thrombocytopenia ([place])   . Thyroid goiter     Past Surgical History:  Procedure Laterality Date  . BIOPSY THYROID    . c-spine  1999   Dr. Annette Stable  . CATARACT EXTRACTION  1997  . fusion L2  2012   Dr. Annette Stable  . HEMORRHOIDECTOMY WITH HEMORRHOID BANDING    . KNEE SURGERY Bilateral 2005   Dr. Berenice Primas  . l-spine  2003   Dr. Annette Stable  . laser surgery for holes in retina Right 08/2012  . outpatient right shoulder  2008   Dr. Percell Miller  . RETINAL DETACHMENT SURGERY  1996   Dr. Glennon Mac  . retineal tear  11/2012   Dr. Darrick Grinder  . VASECTOMY      Social History   Social History  . Marital status: Married    Spouse name: Jeani Hawking  . Number of children: 2  . Years of education: 16+   Occupational History  . retired Pharmacist, hospital    Social History Main Topics  . Smoking status: Never Smoker  . Smokeless tobacco: Never Used  . Alcohol use Yes     Comment: rare  . Drug use: No  . Sexual activity: Not on file   Other Topics Concern  . Not on file   Social History Narrative   Patient lives with his wife.   Patient has 2 children.   Patient is  retired.   Patient drinks 1 glass of caffeine daily.   Patient has a college education.    Current Outpatient Prescriptions on File Prior to Visit  Medication Sig Dispense Refill  . Acetaminophen (TYLENOL PO) Take by mouth as needed.     . ANDROGEL PUMP 20.25 MG/ACT (1.62%) GEL Apply 1 application topically as directed.    . ezetimibe (ZETIA) 10 MG tablet Take 10 mg by mouth daily.    . finasteride (PROSCAR) 5 MG tablet Take 5 mg by mouth daily.    [name] lisinopril (PRINIVIL,ZESTRIL) 20 MG tablet Take 20 mg by mouth daily.    . Melatonin 5 MG TABS Take by mouth at bedtime.    [name] omeprazole (PRILOSEC) 20 MG capsule Take 20 mg by mouth daily as needed.    . propranolol (INDERAL) 10 MG tablet Take 1 tablet by mouth daily.    [name] zolpidem (AMBIEN) 10 MG tablet Take 10 mg by mouth at bedtime as needed for sleep.    . Testosterone (ANDROGEL) 40.5 MG/2.5GM (1.62%) GEL Place onto the skin. 2 pumps daily    . traMADol (ULTRAM) 50 MG tablet Take 1-2  tablets every 6 hours as needed for pain (Patient not taking: Reported on 01/08/2017) 21 tablet 0   No current facility-administered medications on file prior to visit.     Allergies  Allergen Reactions  . Tramadol Other (See Comments)    Hallucinations -He thinks-just remembered had a problem  . Hydrocodone Bitartrate [Hydrocodone] Itching and Other (See Comments)  . Oxycodone Hives  . Statins Other (See Comments)    Muscle aches   . Welchol [Colesevelam Hcl] Other (See Comments)    constipation    Family History  Problem Relation Age of Onset  . Emphysema Father   . Arthritis Father   . Diabetes Brother   . High blood pressure Brother   . Heart disease Paternal Grandfather   . Thyroid nodules Neg Hx     BP 130/70   Pulse 88   Ht 5\' 8"  (1.727 m)   Wt 238 lb (108 kg)   SpO2 97%   BMI 36.19 kg/m    Review of Systems Denies SOB.      Objective:   Physical Exam VS: see vs page.  GEN: no distress.   NECK: a healed scar is present  (C-spine procedure); there is no palpable thyroid enlargement.  No thyroid nodule is palpable.  No palpable lymphadenopathy at the anterior neck.   (pt says TSH was recently normal).     Assessment & Plan:  Multinodular goiter: nonpalpable.  Due for recheck.    Patient Instructions  Let's recheck the ultrasound.  you will receive a phone call, about a day and time for an appointment Please come back for a follow-up appointment in 1 year most of the time, a "lumpy thyroid" will eventually become overactive.  this is usually a slow process, happening over the span of many years.

## 2017-01-19 ENCOUNTER — Ambulatory Visit
Admission: RE | Admit: 2017-01-19 | Discharge: 2017-01-19 | Disposition: A | Discharge status: Home / Self Care | Payer: Medicare Other | Source: Ambulatory Visit | Attending: Endocrinology | Admitting: Endocrinology

## 2017-01-19 DIAGNOSIS — E042 Nontoxic multinodular goiter: Secondary | ICD-10-CM

## 2017-02-05 NOTE — Addendum Note (Signed)
Addendum  created 02/05/17 1031 by Oleta Mouse, MD   Sign clinical note

## 2017-03-03 IMAGING — NM NM MISC PROCEDURE
6 series · 36 of 36 positions shown · non-contrast
Comparison: none

[Series 1: wbr rest · 6.40mm/px · 6 of 64 frames shown]
[frame 6/64]
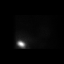
[frame 16/64]
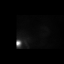
[frame 27/64]
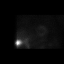
[frame 38/64]
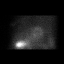
[frame 48/64]
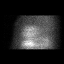
[frame 59/64]
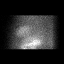

[Series 1: wbr_r-proj_st wbr rest · 6.40mm/px · 6 of 64 frames shown]
[frame 6/64]
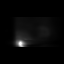
[frame 16/64]
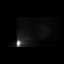
[frame 27/64]
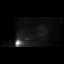
[frame 38/64]
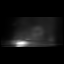
[frame 48/64]
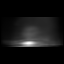
[frame 59/64]
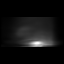

[Series 2: wbr_s-proj_st wbr stress-gsp · 6.40mm/px · 6 of 512 frames shown]
[frame 43/512]
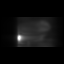
[frame 128/512]
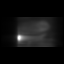
[frame 214/512]
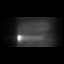
[frame 299/512]
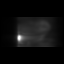
[frame 384/512]
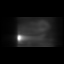
[frame 470/512]
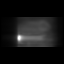

[Series 2: wbr stress-gsp · 6.40mm/px · 6 of 512 frames shown]
[frame 43/512]
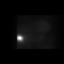
[frame 128/512]
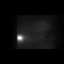
[frame 214/512]
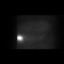
[frame 299/512]
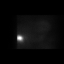
[frame 384/512]
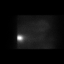
[frame 470/512]
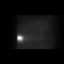

[Series 3: wbr_s-proj_st wbr stress-sum-em · 6.40mm/px · 6 of 64 frames shown]
[frame 6/64]
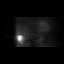
[frame 16/64]
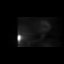
[frame 27/64]
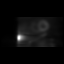
[frame 38/64]
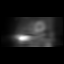
[frame 48/64]
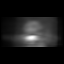
[frame 59/64]
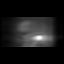

[Series 3: wbr stress-sum-em · 6.40mm/px · 6 of 64 frames shown]
[frame 6/64]
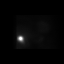
[frame 16/64]
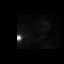
[frame 27/64]
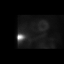
[frame 38/64]
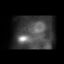
[frame 48/64]
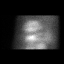
[frame 59/64]
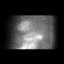

[36 of 36 positions shown; findings below may reference images not displayed]

Canned report from images found in remote index.

Refer to host system for actual result text.

## 2017-03-28 ENCOUNTER — Ambulatory Visit (INDEPENDENT_AMBULATORY_CARE_PROVIDER_SITE_OTHER): Payer: Medicare Other

## 2017-03-28 ENCOUNTER — Encounter: Payer: Self-pay | Admitting: Podiatry

## 2017-03-28 ENCOUNTER — Ambulatory Visit (INDEPENDENT_AMBULATORY_CARE_PROVIDER_SITE_OTHER): Payer: Medicare Other | Admitting: Podiatry

## 2017-03-28 VITALS — BP 166/90 | HR 72

## 2017-03-28 DIAGNOSIS — M21619 Bunion of unspecified foot: Secondary | ICD-10-CM | POA: Diagnosis not present

## 2017-03-28 DIAGNOSIS — M2011 Hallux valgus (acquired), right foot: Secondary | ICD-10-CM

## 2017-03-28 NOTE — Progress Notes (Signed)
   Subjective:    Patient ID: Miguel Miller, male    DOB: [birth year], 76 y.o.   MRN: [id number]  HPI This patient presents today complaining of approximately 4 month history of increasing pain on the medial plantar aspect of the right hallux when standing and walking. The symptoms have become progressively more uncomfortable in the past 4 months. Patient wears supportive shoes and occasional soft insoles. Patient is a diabetic and he denies history of foot ulceration, claudication or amputation. 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      Objective:   Physical Exam   Patient is approximately 5 foot 8 inches and weighs approximately 235 pounds per patient Please note patient states that current weight of 235 pounds is a significant reduction from prior 300 pounds  Orientated 3 No calf pain or calf tenderness bilaterally DP and PT pulses 2/4 bilaterally Capillary reflex immediate bilaterally  Neurological: Sensation to 10 g monofilament wire intact 9/9 right and 7/9 left Vibratory sensation nonreactive right reactive left Ankle reflexes nonreactive bilaterally  Dermatological: No open skin lesions bilaterally Mild reactive plantar callus medial plantar right hallux interphalangeal joint  Musculoskeletal: Atrophic calf left as compared to right 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 Palpable tenderness medial plantar right hallux interphalangeal joint which case the patient's pain Weightbearing pes planus right severe Mild pes planus left Patient not able to heel off left, at weekly  able to heel off right Plantar flexion and dorsiflexion 5/5 bilaterally Plantar flexion left hallux 0/5 left and 5/5 right  X-ray examination weightbearing right foot dated 03/28/2017  Intact bony structures without fracture and or dislocation Moderate/severe HAV right Decreased first MPJ joint space Midfoot degenerative changes Metatarsus adductus Pes planus Posterior and inferior calcaneal spurs  Radiographic impression: No acute bony abnormality 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 I dispensed silicone gel pads to wear over the interphalangeal joints with a recommendation to wear these on a daily basis to reduce friction rub. Also, I informed patient that if he has significant discomfort in this area and the padding would not be helpful I discussed possible surgical treatment to resolve the HAV deformity  Reappoint at patient's request

## 2017-03-28 NOTE — Patient Instructions (Signed)
Bunion A bunion is a bump on the base of the big toe that forms when the bones of the big toe joint move out of position. Bunions may be small at first, but they often get larger over time. The can make walking painful. What are the causes? A bunion may be caused by:  Wearing narrow or pointed shoes that force the big toe to press against the other toes.  Abnormal foot development that causes the foot to roll inward (pronate).  Changes in the foot that are caused by certain diseases, such as rheumatoid arthritis and polio.  A foot injury.  What increases the risk? The following factors may make you more likely to develop this condition:  Wearing shoes that squeeze the toes together.  Having certain diseases, such as: ? Rheumatoid arthritis. ? Polio. ? Cerebral palsy.  Having family members who have bunions.  Being born with a foot deformity, such as flat feet or low arches.  Doing activities that put a lot of pressure on the feet, such as ballet dancing.  What are the signs or symptoms? The main symptom of a bunion is a noticeable bump on the big toe. Other symptoms may include:  Pain.  Swelling around the big toe.  Redness and inflammation.  Thick or hardened skin on the big toe or between the toes.  Stiffness or loss of motion in the big toe.  Trouble with walking.  How is this diagnosed? A bunion may be diagnosed based on your symptoms, medical history, and activities. You may have tests, such as:  X-rays. These allow your health care provider to check the position of the bones in your foot and look for damage to your joint. They also help your health care provider to determine the severity of your bunion and the best way to treat it.  Joint aspiration. In this test, a sample of fluid is removed from the toe joint. This test, which may be done if you are in a lot of pain, helps to rule out diseases that cause painful swelling of the joints, such as  arthritis.  How is this treated? There is no cure for a bunion, but treatment can help to prevent a bunion from getting worse. Treatment depends on the severity of your symptoms. Your health care provider may recommend:  Wearing shoes that have a wide toe box.  Using bunion pads to cushion the affected area.  Taping your toes together to keep them in a normal position.  Placing a device inside your shoe (orthotics) to help reduce pressure on your toe joint.  Taking medicine to ease pain, inflammation, and swelling.  Applying heat or ice to the affected area.  Doing stretching exercises.  Surgery to remove scar tissue and move the toes back into their normal position. This treatment is rare.  Follow these instructions at home:  Support your toe joint with proper footwear, shoe padding, or taping as told by your health care provider.  Take over-the-counter and prescription medicines only as told by your health care provider.  If directed, apply ice to the injured area: ? Put ice in a plastic bag. ? Place a towel between your skin and the bag. ? Leave the ice on for 20 minutes, 2-3 times per day.  If directed, apply heat to the affected area before you exercise. Use the heat source that your health care provider recommends, such as a moist heat pack or a heating pad. ? Place a towel between your   skin and the heat source. ? Leave the heat on for 20-30 minutes. ? Remove the heat if your skin turns bright red. This is especially important if you are unable to feel pain, heat, or cold. You may have a greater risk of getting burned.  Do exercises as told by your health care provider.  Keep all follow-up visits as told by your health care provider. Contact a health care provider if:  Your symptoms get worse.  Your symptoms do not improve in 2 weeks. Get help right away if:  You have severe pain and trouble with walking. This information is not intended to replace advice given  to you by your health care provider. Make sure you discuss any questions you have with your health care provider. Document Released: 08/21/2005 Document Revised: 01/27/2016 Document Reviewed: 03/21/2015 Elsevier Interactive Patient Education  2018 Elsevier Inc.   Diabetes and Foot Care Diabetes may cause you to have problems because of poor blood supply (circulation) to your feet and legs. This may cause the skin on your feet to become thinner, break easier, and heal more slowly. Your skin may become dry, and the skin may peel and crack. You may also have nerve damage in your legs and feet causing decreased feeling in them. You may not notice minor injuries to your feet that could lead to infections or more serious problems. Taking care of your feet is one of the most important things you can do for yourself. Follow these instructions at home:  Wear shoes at all times, even in the house. Do not go barefoot. Bare feet are easily injured.  Check your feet daily for blisters, cuts, and redness. If you cannot see the bottom of your feet, use a mirror or ask someone for help.  Wash your feet with warm water (do not use hot water) and mild soap. Then pat your feet and the areas between your toes until they are completely dry. Do not soak your feet as this can dry your skin.  Apply a moisturizing lotion or petroleum jelly (that does not contain alcohol and is unscented) to the skin on your feet and to dry, brittle toenails. Do not apply lotion between your toes.  Trim your toenails straight across. Do not dig under them or around the cuticle. File the edges of your nails with an emery board or nail file.  Do not cut corns or calluses or try to remove them with medicine.  Wear clean socks or stockings every day. Make sure they are not too tight. Do not wear knee-high stockings since they may decrease blood flow to your legs.  Wear shoes that fit properly and have enough cushioning. To break in new  shoes, wear them for just a few hours a day. This prevents you from injuring your feet. Always look in your shoes before you put them on to be sure there are no objects inside.  Do not cross your legs. This may decrease the blood flow to your feet.  If you find a minor scrape, cut, or break in the skin on your feet, keep it and the skin around it clean and dry. These areas may be cleansed with mild soap and water. Do not cleanse the area with peroxide, alcohol, or iodine.  When you remove an adhesive bandage, be sure not to damage the skin around it.  If you have a wound, look at it several times a day to make sure it is healing.  Do not use   heating pads or hot water bottles. They may burn your skin. If you have lost feeling in your feet or legs, you may not know it is happening until it is too late.  Make sure your health care provider performs a complete foot exam at least annually or more often if you have foot problems. Report any cuts, sores, or bruises to your health care provider immediately. Contact a health care provider if:  You have an injury that is not healing.  You have cuts or breaks in the skin.  You have an ingrown nail.  You notice redness on your legs or feet.  You feel burning or tingling in your legs or feet.  You have pain or cramps in your legs and feet.  Your legs or feet are numb.  Your feet always feel cold. Get help right away if:  There is increasing redness, swelling, or pain in or around a wound.  There is a red line that goes up your leg.  Pus is coming from a wound.  You develop a fever or as directed by your health care provider.  You notice a bad smell coming from an ulcer or wound. This information is not intended to replace advice given to you by your health care provider. Make sure you discuss any questions you have with your health care provider. Document Released: 08/18/2000 Document Revised: 01/27/2016 Document Reviewed:  01/28/2013 Elsevier Interactive Patient Education  2017 Elsevier Inc.   

## 2017-05-11 ENCOUNTER — Telehealth: Payer: Self-pay | Admitting: Cardiovascular Disease

## 2017-05-11 NOTE — Telephone Encounter (Signed)
New message   Pt said that he needs surgical clearance for hip surgery with Percell Miller and [facility] has sent a fax for clearance

## 2017-05-11 NOTE — Telephone Encounter (Signed)
Returned call to patient.   No clearance has been received at this time.  Patient will have office refax.

## 2017-05-15 ENCOUNTER — Encounter: Payer: Self-pay | Admitting: Cardiovascular Disease

## 2017-05-15 ENCOUNTER — Telehealth: Payer: Self-pay

## 2017-05-15 DIAGNOSIS — R7989 Other specified abnormal findings of blood chemistry: Secondary | ICD-10-CM | POA: Diagnosis present

## 2017-05-15 DIAGNOSIS — N400 Enlarged prostate without lower urinary tract symptoms: Secondary | ICD-10-CM | POA: Diagnosis present

## 2017-05-15 DIAGNOSIS — M1612 Unilateral primary osteoarthritis, left hip: Secondary | ICD-10-CM | POA: Diagnosis present

## 2017-05-15 NOTE — H&P (Signed)
PREOPERATIVE H&P Patient ID: Miguel Miller MRN: [id number] DOB/AGE: 02/24/1941 76 y.o.  Chief Complaint: OA LEFT HIP  Planned Procedure Date: 06/05/17 Medical Clearance by Dr. [name]  Cardiac Clearance pending by Dr. Keene Breath   HPI: Miguel Miller is a 76 y.o. male with a history of CAD, HLD, DM2, BPH who presents for evaluation of OA LEFT HIP. The patient has a history of pain and functional disability in the left hip due to arthritis and has failed non-surgical conservative treatments for greater than 12 weeks to include NSAID's and/or analgesics, corticosteriod injections and weight reduction as appropriate.  Onset of symptoms was gradual, starting 2 years ago with gradually worsening course since that time.  Patient currently rates pain at 6 out of 10 with activity. Patient has worsening of pain with activity and weight bearing and pain that interferes with activities of daily living.  Patient has evidence of periarticular osteophytes and joint space narrowing by imaging studies.  There is no active infection.  Past Medical History:  Diagnosis Date  . Actinic keratoses   . Arteriosclerosis   . BPH (benign prostatic hyperplasia)   . Diabetes mellitus type 2, controlled, without complications ([place])    diet controlled  . Diverticulosis   . DJD (degenerative joint disease)   . Dyslipidemia   . Eczema   . ED (erectile dysfunction)   . Essential tremor   . GERD (gastroesophageal reflux disease)   . Glaucoma    h/o retineal tear; pt denies glaucoma(09/29/16)  . Hypertension   . Low testosterone   . Thrombocytopenia ([place])   . Thyroid goiter    Past Surgical History:  Procedure Laterality Date  . BIOPSY THYROID    . c-spine  1999   Dr. Annette Stable  . CATARACT EXTRACTION  1997  . fusion L2  2012   Dr. Annette Stable  . HEMORRHOIDECTOMY WITH HEMORRHOID BANDING    . KNEE SURGERY Bilateral 2005   Dr. Berenice Primas  . l-spine  2003   Dr. Annette Stable  . laser surgery for holes in retina Right 08/2012  .  outpatient right shoulder  2008   Dr. Percell Miller  . RETINAL DETACHMENT SURGERY  1996   Dr. Glennon Mac  . retineal tear  11/2012   Dr. Darrick Grinder  . VASECTOMY     Allergies  Allergen Reactions  . Tramadol Other (See Comments)    Hallucinations -He thinks-just remembered had a problem  . Hydrocodone Bitartrate [Hydrocodone] Itching and Other (See Comments)  . Oxycodone Hives  . Statins Other (See Comments)    Muscle aches   . Welchol [Colesevelam Hcl] Other (See Comments)    constipation   Prior to Admission medications   Medication Sig Start Date End Date Taking? Authorizing Provider  Acetaminophen (TYLENOL PO) Take by mouth as needed.     [provider]  ANDROGEL PUMP 20.25 MG/ACT (1.62%) GEL Apply 1 application topically as directed. 08/14/16   [provider]  ezetimibe (ZETIA) 10 MG tablet Take 10 mg by mouth daily.    [provider]  finasteride (PROSCAR) 5 MG tablet Take 5 mg by mouth daily.    [provider]  lisinopril (PRINIVIL,ZESTRIL) 20 MG tablet Take 20 mg by mouth daily.    [provider]  Melatonin 5 MG TABS Take by mouth at bedtime.    [provider]  omeprazole (PRILOSEC) 20 MG capsule Take 20 mg by mouth daily as needed.    [provider]  propranolol (INDERAL) 10 MG tablet Take  1 tablet by mouth daily. 08/03/16   [provider]  Testosterone (ANDROGEL) 40.5 MG/2.5GM (1.62%) GEL Place onto the skin. 2 pumps daily    [provider]  traMADol (ULTRAM) 50 MG tablet Take 1-2 tablets every 6 hours as needed for pain Patient not taking: Reported on 01/08/2017 10/02/16   Lolita Rieger, MD  zolpidem (AMBIEN) 10 MG tablet Take 10 mg by mouth at bedtime as needed for sleep.    [provider]   Social History   Social History  . Marital status: Married    Spouse name: Jeani Hawking  . Number of children: 2  . Years of education: 16+   Occupational History  . retired Pharmacist, hospital    Social  History Main Topics  . Smoking status: Never Smoker  . Smokeless tobacco: Never Used  . Alcohol use Yes     Comment: rare  . Drug use: No  . Sexual activity: Not on file   Other Topics Concern  . Not on file   Social History Narrative   Patient lives with his wife.   Patient has 2 children.   Patient is retired.   Patient drinks 1 glass of caffeine daily.   Patient has a college education.   Family History  Problem Relation Age of Onset  . Emphysema Father   . Arthritis Father   . Diabetes Brother   . High blood pressure Brother   . Heart disease Paternal Grandfather   . Thyroid nodules Neg Hx     ROS: Currently denies lightheadedness, dizziness, Fever, chills, CP, SOB.   No personal history of DVT, PE, MI, or CVA. No loose teeth or dentures All other systems have been reviewed and were otherwise currently negative with the exception of those mentioned in the HPI and as above.  Objective: Vitals: Ht: 5'8" Wt: 236 Temp: 98.0 BP: 166/87 Pulse: 67 O2 97% on room air. Physical Exam: General: Alert, NAD. Trendelenberg Gait  HEENT: EOMI, Good Neck Extension  Pulm: No increased work of breathing.  Clear B/L A/P w/o crackle or wheeze.  CV: RRR, No m/g/r appreciated  GI: soft, NT, ND Neuro: Neuro without gross focal deficit.  Sensation intact distally Skin: No lesions in the area of chief complaint MSK/Surgical Site: left Hip Non tender over greater trochanter.  Pain with passive ROM.  Positive Stinchfield.  5/5 strength.  NVI.  Sensation intact distally.   Imaging Review Plain radiographs demonstrate severe degenerative joint disease of the left hip.   MRI shows labral degeneration and osteo fragmentation.  Assessment: OA LEFT HIP Principal Problem:   Primary osteoarthritis of left hip Active Problems:   CAD (coronary artery disease)   CHF (congestive heart failure) (HCC)   Essential hypertension   Hypercholesterolemia   Multinodular goiter   BPH (benign prostatic  hyperplasia)   Low testosterone   Plan: Plan for Procedure(s): TOTAL HIP ARTHROPLASTY ANTERIOR APPROACH  The patient history, physical exam, clinical judgement of the provider and imaging are consistent with end stage degenerative joint disease and total joint arthroplasty is deemed medically necessary. The treatment options including medical management, injection therapy, and arthroplasty were discussed at length. The risks and benefits of Procedure(s): TOTAL HIP ARTHROPLASTY ANTERIOR APPROACH were presented and reviewed.  The risks of nonoperative treatment, versus surgical intervention including but not limited to continued pain, aseptic loosening, stiffness, dislocation/subluxation, infection, bleeding, nerve injury, blood clots, cardiopulmonary complications, morbidity, mortality, among others were discussed. The patient verbalizes understanding and wishes to proceed with  the plan.  Patient is being admitted for inpatient treatment for surgery, pain control, PT, OT, prophylactic antibiotics, VTE prophylaxis, progressive ambulation, ADL's and discharge planning.   Dental prophylaxis discussed and recommended for 2 years postoperatively.   The patient does meet the criteria for TXA which will be used perioperatively via IV.    IV morphine okay.  Does not tolerate Percocet or Norco dt itching and severe constipation.  Has had better postoperative success w/ PO Dilaudid.  Likely plan for (1/2 to 1) 2 mg  tab q 3-4 hrs initially along with other PRN / scheduled multimodal pain control regimen.  ASA 325 mg  will be used postoperatively for DVT prophylaxis in addition to SCDs, and early ambulation.  The patient is planning to be discharged home with home health services ([facility]) in care of his wife  Prudencio Burly III, [place] 05/15/2017 9:53 AM

## 2017-05-15 NOTE — Telephone Encounter (Signed)
epicd 

## 2017-05-15 NOTE — Telephone Encounter (Signed)
1. Type of surgery: left total hip replacement 2. Date of surgery: 06/05/17 3. Surgeon: Dr Edmonia Lynch 4. Medications that need to be held & how long: N/A 5. Fax and/or Phone: (p) [phone] (f) [phone]

## 2017-05-15 NOTE — Telephone Encounter (Signed)
Faxed to [employer] [facility] at [facility].

## 2017-05-24 NOTE — Pre-Procedure Instructions (Signed)
Miguel Miller  05/24/2017      [address] [place] [address] Phone: [phone] Fax: [phone]    Your procedure is scheduled on 06/05/2017.  Report to [facility] Admitting at [phone] A.M.  Call this number if you have problems the morning of surgery:  [phone]   Remember:  Do not eat food or drink liquids after midnight.  Take these medicines the morning of surgery with A SIP OF WATER:  7 days prior to surgery STOP taking any Aspirin, Aleve, Naproxen, Ibuprofen, Motrin, Advil, Goody's, BC's, all herbal medications, fish oil, and all vitamins   How to Manage Your Diabetes Before and After Surgery  Why is it important to control my blood sugar before and after surgery? . Improving blood sugar levels before and after surgery helps healing and can limit problems. . A way of improving blood sugar control is eating a healthy diet by: o  Eating less sugar and carbohydrates o  Increasing activity/exercise o  Talking with your doctor about reaching your blood sugar goals . High blood sugars (greater than 180 mg/dL) can raise your risk of infections and slow your recovery, so you will need to focus on controlling your diabetes during the weeks before surgery. . Make sure that the doctor who takes care of your diabetes knows about your planned surgery including the date and location.  How do I manage my blood sugar before surgery? . Check your blood sugar at least 4 times a day, starting 2 days before surgery, to make sure that the level is not too high or low. o Check your blood sugar the morning of your surgery when you wake up and every 2 hours until you get to the Short Stay unit. . If your blood sugar is less than 70 mg/dL, you will need to treat for low blood sugar: o Do not take insulin. o Treat a low blood sugar (less than 70 mg/dL) with  cup of clear juice (cranberry or  apple), 4 glucose tablets, OR glucose gel. o Recheck blood sugar in 15 minutes after treatment (to make sure it is greater than 70 mg/dL). If your blood sugar is not greater than 70 mg/dL on recheck, call [phone] for further instructions. . Report your blood sugar to the short stay nurse when you get to Short Stay.  . If you are admitted to the hospital after surgery: o Your blood sugar will be checked by the staff and you will probably be given insulin after surgery (instead of oral diabetes medicines) to make sure you have good blood sugar levels. o The goal for blood sugar control after surgery is 80-180 mg/dL.    Do not wear jewelry  Do not wear lotions, powders, or colognes, or deodorant.  Do not shave 48 hours prior to surgery.  Men may shave face and neck.  Do not bring valuables to the hospital.  [facility] is not responsible for any belongings or valuables.  Contacts, eyeglasses, dentures or bridgework may not be worn into surgery.  Leave your suitcase in the car.  After surgery it may be brought to your room.  For patients admitted to the hospital, discharge time will be determined by your treatment team.  Patients discharged the day of surgery will not be allowed to drive home.   Name and phone number of your driver:    Special instructions:   [facility]- Preparing For Surgery  Before surgery, you can play an important role. Because skin is not sterile, your skin needs to be as free of germs as possible. You can reduce the number of germs on your skin by washing with CHG (chlorahexidine gluconate) Soap before surgery.  CHG is an antiseptic cleaner which kills germs and bonds with the skin to continue killing germs even after washing.  Please do not use if you have an allergy to CHG or antibacterial soaps. If your skin becomes reddened/irritated stop using the CHG.  Do not shave (including legs and underarms) for at least 48 hours prior to first CHG shower. It is OK to  shave your face.  Please follow these instructions carefully.   1. Shower the NIGHT BEFORE SURGERY and the MORNING OF SURGERY with CHG.   2. If you chose to wash your hair, wash your hair first as usual with your normal shampoo.  3. After you shampoo, rinse your hair and body thoroughly to remove the shampoo.  4. Use CHG as you would any other liquid soap. You can apply CHG directly to the skin and wash gently with a scrungie or a clean washcloth.   5. Apply the CHG Soap to your body ONLY FROM THE NECK DOWN.  Do not use on open wounds or open sores. Avoid contact with your eyes, ears, mouth and genitals (private parts). Wash genitals (private parts) with your normal soap.  6. Wash thoroughly, paying special attention to the area where your surgery will be performed.  7. Thoroughly rinse your body with warm water from the neck down.  8. DO NOT shower/wash with your normal soap after using and rinsing off the CHG Soap.  9. Pat yourself dry with a CLEAN TOWEL.   10. Wear CLEAN PAJAMAS   11. Place CLEAN SHEETS on your bed the night of your first shower and DO NOT SLEEP WITH PETS.    Day of Surgery: Do not apply any deodorants/lotions. Please wear clean clothes to the hospital/surgery center.      Please read over the following fact sheets that you were given. Pain Booklet, Coughing and Deep Breathing, Total Joint Packet, MRSA Information and Surgical Site Infection Prevention Incentive Spirometry

## 2017-05-25 ENCOUNTER — Encounter (HOSPITAL_COMMUNITY): Payer: Self-pay

## 2017-05-25 ENCOUNTER — Encounter (HOSPITAL_COMMUNITY)
Admission: RE | Admit: 2017-05-25 | Discharge: 2017-05-25 | Disposition: A | Discharge status: Home / Self Care | Payer: Medicare Other | Source: Ambulatory Visit | Attending: Orthopedic Surgery | Admitting: Orthopedic Surgery

## 2017-05-25 DIAGNOSIS — M1612 Unilateral primary osteoarthritis, left hip: Secondary | ICD-10-CM | POA: Insufficient documentation

## 2017-05-25 DIAGNOSIS — Z01818 Encounter for other preprocedural examination: Secondary | ICD-10-CM | POA: Insufficient documentation

## 2017-05-25 HISTORY — DX: Peripheral vascular disease, unspecified: I73.9

## 2017-05-25 LAB — BASIC METABOLIC PANEL
Anion gap: 7 (ref 5–15)
BUN: 27 mg/dL — ABNORMAL HIGH (ref 6–20)
CO2: 24 mmol/L (ref 22–32)
Calcium: 9.3 mg/dL (ref 8.9–10.3)
Chloride: 105 mmol/L (ref 101–111)
Creatinine, Ser: 1.12 mg/dL (ref 0.61–1.24)
GFR calc Af Amer: >60 mL/min (ref >60)
GFR calc non Af Amer: >60 mL/min (ref >60)
Glucose, Bld: 180 mg/dL — ABNORMAL HIGH (ref 65–99)
Potassium: 4.6 mmol/L (ref 3.5–5.1)
Sodium: 136 mmol/L (ref 135–145)

## 2017-05-25 LAB — GLUCOSE, CAPILLARY: Glucose-Capillary: 198 mg/dL — ABNORMAL HIGH (ref 65–99)

## 2017-05-25 LAB — CBC
HCT: 50.7 % (ref 39.0–52.0)
Hemoglobin: 15.9 g/dL (ref 13.0–17.0)
MCH: 29.1 pg (ref 26.0–34.0)
MCHC: 31.4 g/dL (ref 30.0–36.0)
MCV: 92.9 fL (ref 78.0–100.0)
Platelets: 134 10*3/uL — ABNORMAL LOW (ref 150–400)
RBC: 5.46 MIL/uL (ref 4.22–5.81)
RDW: 13.9 % (ref 11.5–15.5)
WBC: 4.8 10*3/uL (ref 4.0–10.5)

## 2017-05-25 LAB — HEMOGLOBIN A1C
Hgb A1c MFr Bld: 6.2 % — ABNORMAL HIGH (ref 4.8–5.6)
Mean Plasma Glucose: 131.24 mg/dL

## 2017-05-25 LAB — SURGICAL PCR SCREEN
MRSA, PCR: NEGATIVE
Staphylococcus aureus: NEGATIVE

## 2017-05-25 NOTE — Progress Notes (Signed)
   05/25/17 1026  OBSTRUCTIVE SLEEP APNEA  Have you ever been diagnosed with sleep apnea through a sleep study? No  Do you snore loudly (loud enough to be heard through closed doors)?  0  Do you often feel tired, fatigued, or sleepy during the daytime (such as falling asleep during driving or talking to someone)? 0  Has anyone observed you stop breathing during your sleep? 0  Do you have, or are you being treated for high blood pressure? 1  BMI more than 35 kg/m2? 1  Age > 50 (1-yes) 1  Neck circumference greater than:Male 16 inches or larger, Male 17inches or larger? 1  Male Gender (Yes=1) 1  Obstructive Sleep Apnea Score 5  Score 5 or greater  Results sent to PCP

## 2017-05-25 NOTE — Pre-Procedure Instructions (Addendum)
Miguel Miller  05/25/2017      [place], [place]  [place] [address] Phone: [phone] Fax: [phone]    Your procedure is scheduled on 06/05/2017.  Report to [facility] Admitting at [phone] A.M.  Call this number if you have problems the morning of surgery:  [phone]   Remember:  Do not eat food or drink liquids after midnight.  Take these medicines the morning of surgery with A SIP OF WATER:   FINASTERIDE, OMEPRAZOLE, PROPRANOLOL  7 days prior to surgery STOP taking any Aspirin, Aleve, Naproxen, Ibuprofen, Motrin, Advil, Goody's, BC's, all herbal medications, fish oil, and all vitamins   How to Manage Your Diabetes Before and After Surgery  Why is it important to control my blood sugar before and after surgery? . Improving blood sugar levels before and after surgery helps healing and can limit problems. . A way of improving blood sugar control is eating a healthy diet by: o  Eating less sugar and carbohydrates o  Increasing activity/exercise o  Talking with your doctor about reaching your blood sugar goals . High blood sugars (greater than 180 mg/dL) can raise your risk of infections and slow your recovery, so you will need to focus on controlling your diabetes during the weeks before surgery. . Make sure that the doctor who takes care of your diabetes knows about your planned surgery including the date and location.  How do I manage my blood sugar before surgery? . Check your blood sugar at least 4 times a day, starting 2 days before surgery, to make sure that the level is not too high or low. o Check your blood sugar the morning of your surgery when you wake up and every 2 hours until you get to the Short Stay unit. . If your blood sugar is less than 70 mg/dL, you will need to treat for low blood sugar: o Do not take insulin. o Treat a low blood sugar (less than 70 mg/dL) with   cup of clear juice (cranberry or apple), 4 glucose tablets, OR glucose gel. o Recheck blood sugar in 15 minutes after treatment (to make sure it is greater than 70 mg/dL). If your blood sugar is not greater than 70 mg/dL on recheck, call [phone] for further instructions. . Report your blood sugar to the short stay nurse when you get to Short Stay.  . If you are admitted to the hospital after surgery: o Your blood sugar will be checked by the staff and you will probably be given insulin after surgery (instead of oral diabetes medicines) to make sure you have good blood sugar levels. o The goal for blood sugar control after surgery is 80-180 mg/dL.    Do not wear jewelry  Do not wear lotions, powders, or colognes, or deodorant.  Do not shave 48 hours prior to surgery.  Men may shave face and neck.  Do not bring valuables to the hospital.  [facility] is not responsible for any belongings or valuables.  Contacts, eyeglasses, dentures or bridgework may not be worn into surgery.  Leave your suitcase in the car.  After surgery it may be brought to your room.  For patients admitted to the hospital, discharge time will be determined by your treatment team.  Patients discharged the day of surgery will not be allowed to drive home.   Name and phone number of your driver:  Special instructions:   [facility]- Preparing For Surgery  Before surgery, you can play an important role. Because skin is not sterile, your skin needs to be as free of germs as possible. You can reduce the number of germs on your skin by washing with CHG (chlorahexidine gluconate) Soap before surgery.  CHG is an antiseptic cleaner which kills germs and bonds with the skin to continue killing germs even after washing.  Please do not use if you have an allergy to CHG or antibacterial soaps. If your skin becomes reddened/irritated stop using the CHG.  Do not shave (including legs and underarms) for at least 48 hours prior  to first CHG shower. It is OK to shave your face.  Please follow these instructions carefully.   1. Shower the NIGHT BEFORE SURGERY and the MORNING OF SURGERY with CHG.   2. If you chose to wash your hair, wash your hair first as usual with your normal shampoo.  3. After you shampoo, rinse your hair and body thoroughly to remove the shampoo.  4. Use CHG as you would any other liquid soap. You can apply CHG directly to the skin and wash gently with a scrungie or a clean washcloth.   5. Apply the CHG Soap to your body ONLY FROM THE NECK DOWN.  Do not use on open wounds or open sores. Avoid contact with your eyes, ears, mouth and genitals (private parts). Wash genitals (private parts) with your normal soap.  6. Wash thoroughly, paying special attention to the area where your surgery will be performed.  7. Thoroughly rinse your body with warm water from the neck down.  8. DO NOT shower/wash with your normal soap after using and rinsing off the CHG Soap.  9. Pat yourself dry with a CLEAN TOWEL.   10. Wear CLEAN PAJAMAS   11. Place CLEAN SHEETS on your bed the night of your first shower and DO NOT SLEEP WITH PETS.    Day of Surgery: Do not apply any deodorants/lotions. Please wear clean clothes to the hospital/surgery center.      Please read over the following fact sheets that you were given. Pain Booklet, Coughing and Deep Breathing, Total Joint Packet, MRSA Information and Surgical Site Infection Prevention Incentive Spirometry

## 2017-05-28 NOTE — Progress Notes (Signed)
Anesthesia Chart Review:  Pt is a 76 year old male scheduled for L total hip arthroplasty anterior approach on 06/05/2017 with Edmonia Lynch, MD  - PCP is [name], MD - Endocrinologist is Renato Shin, MD - Cardiologist is Sanda Klein, MD who cleared pt for surgery. Last office visit 08/18/16; f/u in 1 year recommended.   PMH includes:  HTN, DM (diet controlled), hyperlipidemia, PVD, thrombocytopenia, multinodular thyroid goiter, glaucoma, essential tremor, GERD. Never smoker. BMI 36. S/p laser TURP 10/02/16.   Medications include: zetia, lisinopril, prilosec, propranolol  BP (!) 174/84   Pulse 66   Temp 36.8 C   Resp 20   Ht 5\' 8"  (1.727 m)   Wt 236 lb 3.2 oz (107.1 kg)   SpO2 98%   BMI 35.91 kg/m   Preoperative labs reviewed.  HbA1c 6.2, glucose 180.   Echo 08/10/16:  - Left ventricle: The cavity size was normal. Wall thickness was normal. The estimated ejection fraction was 55%. Although no diagnostic regional wall motion abnormality was identified, this possibility cannot be completely excluded on the basis of this study. Doppler parameters are consistent with abnormal left ventricular relaxation (grade 1 diastolic dysfunction). - Aortic valve: There was no stenosis. There was trivial regurgitation. - Mitral valve: There was no significant regurgitation. - Right ventricle: The cavity size was normal. Systolic function was normal. - Pulmonary arteries: No complete TR doppler jet so unable to estimate PA systolic pressure. - Inferior vena cava: The vessel was normal in size. The respirophasic diameter changes were in the normal range (>= 50%), consistent with normal central venous pressure. - Impressions: Normal LV size and systolic function, EF 16%. Normal RV size and systolic function. No significant valvular abnormalities.  Nuclear stress test 08/02/16:   Nuclear stress EF: 44%. Visually, the EF appears to be closer to 55% and is normal . Suggest echo for further  assessment of LV EF if not already done .  There was no ST segment deviation noted during stress.  The study is normal.  This is a low risk study.  If no changes, I anticipate pt can proceed with surgery as scheduled.   Willeen Cass, FNP-BC [facility]/Anesthesiology Phone: [phone] 05/28/2017 1:19 PM

## 2017-06-04 MED ORDER — TRANEXAMIC ACID 1000 MG/10ML IV SOLN
2000.0000 mg | INTRAVENOUS | Status: AC
  Administered 2017-06-05: 2000 mg via TOPICAL
  Filled 2017-06-04: qty 20

## 2017-06-04 MED ORDER — ACETAMINOPHEN 500 MG PO TABS
1000.0000 mg | ORAL_TABLET | Freq: Once | ORAL | Status: AC
  Administered 2017-06-05: 1000 mg via ORAL
  Filled 2017-06-04: qty 2

## 2017-06-04 MED ORDER — LACTATED RINGERS IV SOLN
INTRAVENOUS | Status: DC
  Administered 2017-06-05 (×2): via INTRAVENOUS

## 2017-06-04 MED ORDER — GABAPENTIN 300 MG PO CAPS
300.0000 mg | ORAL_CAPSULE | Freq: Once | ORAL | Status: AC
  Administered 2017-06-05: 300 mg via ORAL
  Filled 2017-06-04: qty 1

## 2017-06-04 MED ORDER — CEFAZOLIN SODIUM-DEXTROSE 2-4 GM/100ML-% IV SOLN
2.0000 g | INTRAVENOUS | Status: AC
  Administered 2017-06-05: 2 g via INTRAVENOUS
  Filled 2017-06-04: qty 100

## 2017-06-04 MED ORDER — SODIUM CHLORIDE 0.9 % IV SOLN
1000.0000 mg | INTRAVENOUS | Status: AC
  Administered 2017-06-05: 1000 mg via INTRAVENOUS
  Filled 2017-06-04: qty 1100

## 2017-06-05 ENCOUNTER — Inpatient Hospital Stay (HOSPITAL_COMMUNITY): Payer: Medicare Other

## 2017-06-05 ENCOUNTER — Encounter (HOSPITAL_COMMUNITY)
Admission: RE | Disposition: A | Discharge status: Home Health | Payer: Self-pay | Source: Ambulatory Visit | Attending: Orthopedic Surgery

## 2017-06-05 ENCOUNTER — Encounter (HOSPITAL_COMMUNITY): Payer: Self-pay | Admitting: Certified Registered Nurse Anesthetist

## 2017-06-05 ENCOUNTER — Inpatient Hospital Stay (HOSPITAL_COMMUNITY)
Admission: RE | Admit: 2017-06-05 | Discharge: 2017-06-06 | DRG: 470 | Disposition: A | Discharge status: Home Health | Payer: Medicare Other | Source: Ambulatory Visit | Attending: Orthopedic Surgery | Admitting: Orthopedic Surgery

## 2017-06-05 ENCOUNTER — Inpatient Hospital Stay (HOSPITAL_COMMUNITY): Payer: Medicare Other | Admitting: Emergency Medicine

## 2017-06-05 DIAGNOSIS — Z6835 Body mass index (BMI) 35.0-35.9, adult: Secondary | ICD-10-CM | POA: Diagnosis not present

## 2017-06-05 DIAGNOSIS — Z8261 Family history of arthritis: Secondary | ICD-10-CM

## 2017-06-05 DIAGNOSIS — I2510 Atherosclerotic heart disease of native coronary artery without angina pectoris: Secondary | ICD-10-CM | POA: Diagnosis present

## 2017-06-05 DIAGNOSIS — M1612 Unilateral primary osteoarthritis, left hip: Principal | ICD-10-CM | POA: Diagnosis present

## 2017-06-05 DIAGNOSIS — E042 Nontoxic multinodular goiter: Secondary | ICD-10-CM | POA: Diagnosis present

## 2017-06-05 DIAGNOSIS — I509 Heart failure, unspecified: Secondary | ICD-10-CM | POA: Diagnosis present

## 2017-06-05 DIAGNOSIS — I10 Essential (primary) hypertension: Secondary | ICD-10-CM | POA: Diagnosis present

## 2017-06-05 DIAGNOSIS — I110 Hypertensive heart disease with heart failure: Secondary | ICD-10-CM | POA: Diagnosis present

## 2017-06-05 DIAGNOSIS — N400 Enlarged prostate without lower urinary tract symptoms: Secondary | ICD-10-CM | POA: Diagnosis present

## 2017-06-05 DIAGNOSIS — K219 Gastro-esophageal reflux disease without esophagitis: Secondary | ICD-10-CM | POA: Diagnosis present

## 2017-06-05 DIAGNOSIS — E669 Obesity, unspecified: Secondary | ICD-10-CM | POA: Diagnosis present

## 2017-06-05 DIAGNOSIS — E785 Hyperlipidemia, unspecified: Secondary | ICD-10-CM | POA: Diagnosis present

## 2017-06-05 DIAGNOSIS — E1151 Type 2 diabetes mellitus with diabetic peripheral angiopathy without gangrene: Secondary | ICD-10-CM | POA: Diagnosis present

## 2017-06-05 DIAGNOSIS — Z23 Encounter for immunization: Secondary | ICD-10-CM

## 2017-06-05 DIAGNOSIS — Z888 Allergy status to other drugs, medicaments and biological substances status: Secondary | ICD-10-CM | POA: Diagnosis not present

## 2017-06-05 DIAGNOSIS — E7800 Pure hypercholesterolemia, unspecified: Secondary | ICD-10-CM | POA: Diagnosis present

## 2017-06-05 DIAGNOSIS — Z8249 Family history of ischemic heart disease and other diseases of the circulatory system: Secondary | ICD-10-CM | POA: Diagnosis not present

## 2017-06-05 DIAGNOSIS — Z833 Family history of diabetes mellitus: Secondary | ICD-10-CM

## 2017-06-05 DIAGNOSIS — Z419 Encounter for procedure for purposes other than remedying health state, unspecified: Secondary | ICD-10-CM

## 2017-06-05 DIAGNOSIS — Z885 Allergy status to narcotic agent status: Secondary | ICD-10-CM

## 2017-06-05 DIAGNOSIS — E1169 Type 2 diabetes mellitus with other specified complication: Secondary | ICD-10-CM | POA: Diagnosis present

## 2017-06-05 DIAGNOSIS — R7989 Other specified abnormal findings of blood chemistry: Secondary | ICD-10-CM | POA: Diagnosis present

## 2017-06-05 HISTORY — PX: TOTAL HIP ARTHROPLASTY: SHX124

## 2017-06-05 LAB — GLUCOSE, CAPILLARY
Glucose-Capillary: 135 mg/dL — ABNORMAL HIGH (ref 65–99)
Glucose-Capillary: 133 mg/dL — ABNORMAL HIGH (ref 65–99)

## 2017-06-05 SURGERY — ARTHROPLASTY, HIP, TOTAL, ANTERIOR APPROACH
Anesthesia: General | Laterality: Left

## 2017-06-05 MED ORDER — METHOCARBAMOL 500 MG PO TABS
ORAL_TABLET | ORAL | Status: AC
  Filled 2017-06-05: qty 1

## 2017-06-05 MED ORDER — MIDAZOLAM HCL 2 MG/2ML IJ SOLN
INTRAMUSCULAR | Status: AC
  Filled 2017-06-05: qty 2

## 2017-06-05 MED ORDER — INFLUENZA VAC SPLIT HIGH-DOSE 0.5 ML IM SUSY
0.5000 mL | PREFILLED_SYRINGE | INTRAMUSCULAR | Status: AC
  Administered 2017-06-06: 0.5 mL via INTRAMUSCULAR
  Filled 2017-06-05: qty 0.5

## 2017-06-05 MED ORDER — METHOCARBAMOL 500 MG PO TABS
500.0000 mg | ORAL_TABLET | Freq: Four times a day (QID) | ORAL | Status: DC | PRN
  Administered 2017-06-05 (×2): 500 mg via ORAL
  Filled 2017-06-05 (×2): qty 1

## 2017-06-05 MED ORDER — CELECOXIB 200 MG PO CAPS
200.0000 mg | ORAL_CAPSULE | Freq: Two times a day (BID) | ORAL | Status: DC
  Administered 2017-06-05 – 2017-06-06 (×2): 200 mg via ORAL
  Filled 2017-06-05 (×2): qty 1

## 2017-06-05 MED ORDER — ONDANSETRON HCL 4 MG/2ML IJ SOLN: 4.0000 mg | Freq: Four times a day (QID) | INTRAMUSCULAR | Status: DC | PRN

## 2017-06-05 MED ORDER — DEXAMETHASONE SODIUM PHOSPHATE 10 MG/ML IJ SOLN
INTRAMUSCULAR | Status: DC | PRN
  Administered 2017-06-05: 10 mg via INTRAVENOUS

## 2017-06-05 MED ORDER — MENTHOL 3 MG MT LOZG: 1.0000 | LOZENGE | OROMUCOSAL | Status: DC | PRN

## 2017-06-05 MED ORDER — SUGAMMADEX SODIUM 200 MG/2ML IV SOLN
INTRAVENOUS | Status: DC | PRN
Start: 2017-06-05 — End: 2017-06-05
  Administered 2017-06-05: 200 mg via INTRAVENOUS

## 2017-06-05 MED ORDER — ZOLPIDEM TARTRATE 5 MG PO TABS: 5.0000 mg | ORAL_TABLET | Freq: Every evening | ORAL | Status: DC | PRN

## 2017-06-05 MED ORDER — FENTANYL CITRATE (PF) 100 MCG/2ML IJ SOLN
25.0000 ug | INTRAMUSCULAR | Status: DC | PRN
  Administered 2017-06-05 (×3): 50 ug via INTRAVENOUS

## 2017-06-05 MED ORDER — ROCURONIUM BROMIDE 100 MG/10ML IV SOLN
INTRAVENOUS | Status: DC | PRN
  Administered 2017-06-05: 50 mg via INTRAVENOUS
  Administered 2017-06-05: 20 mg via INTRAVENOUS
  Administered 2017-06-05: 10 mg via INTRAVENOUS

## 2017-06-05 MED ORDER — METOCLOPRAMIDE HCL 5 MG/ML IJ SOLN: 5.0000 mg | Freq: Three times a day (TID) | INTRAMUSCULAR | Status: DC | PRN

## 2017-06-05 MED ORDER — LISINOPRIL 20 MG PO TABS
20.0000 mg | ORAL_TABLET | Freq: Every evening | ORAL | Status: DC
Start: 2017-06-05 — End: 2017-06-06
  Administered 2017-06-05: 20 mg via ORAL
  Filled 2017-06-05: qty 1

## 2017-06-05 MED ORDER — HYDROMORPHONE HCL 2 MG PO TABS: 1.0000 mg | ORAL_TABLET | Freq: Four times a day (QID) | ORAL | 0 refills | Status: DC | PRN

## 2017-06-05 MED ORDER — 0.9 % SODIUM CHLORIDE (POUR BTL) OPTIME
TOPICAL | Status: DC | PRN
Start: 2017-06-05 — End: 2017-06-05
  Administered 2017-06-05: 1000 mL

## 2017-06-05 MED ORDER — MEPERIDINE HCL 25 MG/ML IJ SOLN: 6.2500 mg | INTRAMUSCULAR | Status: DC | PRN

## 2017-06-05 MED ORDER — PHENYLEPHRINE 40 MCG/ML (10ML) SYRINGE FOR IV PUSH (FOR BLOOD PRESSURE SUPPORT)
PREFILLED_SYRINGE | INTRAVENOUS | Status: DC | PRN
  Administered 2017-06-05: 120 ug via INTRAVENOUS

## 2017-06-05 MED ORDER — SENNA 8.6 MG PO TABS
1.0000 | ORAL_TABLET | Freq: Two times a day (BID) | ORAL | Status: DC
  Administered 2017-06-05 – 2017-06-06 (×2): 8.6 mg via ORAL
  Filled 2017-06-05 (×2): qty 1

## 2017-06-05 MED ORDER — ONDANSETRON HCL 4 MG/2ML IJ SOLN
INTRAMUSCULAR | Status: DC | PRN
  Administered 2017-06-05: 4 mg via INTRAVENOUS

## 2017-06-05 MED ORDER — PHENYLEPHRINE HCL 10 MG/ML IJ SOLN
INTRAVENOUS | Status: DC | PRN
  Administered 2017-06-05: 40 ug/min via INTRAVENOUS

## 2017-06-05 MED ORDER — FENTANYL CITRATE (PF) 100 MCG/2ML IJ SOLN
INTRAMUSCULAR | Status: DC | PRN
  Administered 2017-06-05: 50 ug via INTRAVENOUS
  Administered 2017-06-05: 100 ug via INTRAVENOUS
  Administered 2017-06-05 (×2): 50 ug via INTRAVENOUS

## 2017-06-05 MED ORDER — METHOCARBAMOL 1000 MG/10ML IJ SOLN
500.0000 mg | Freq: Four times a day (QID) | INTRAVENOUS | Status: DC | PRN
  Filled 2017-06-05: qty 5

## 2017-06-05 MED ORDER — CELECOXIB 200 MG PO CAPS: 200.0000 mg | ORAL_CAPSULE | Freq: Two times a day (BID) | ORAL | 0 refills | Status: DC

## 2017-06-05 MED ORDER — POLYETHYLENE GLYCOL 3350 17 G PO PACK: 17.0000 g | PACK | Freq: Every day | ORAL | Status: DC | PRN

## 2017-06-05 MED ORDER — DIAZEPAM 5 MG PO TABS
5.0000 mg | ORAL_TABLET | Freq: Four times a day (QID) | ORAL | Status: DC | PRN
  Administered 2017-06-06: 5 mg via ORAL
  Filled 2017-06-05: qty 1

## 2017-06-05 MED ORDER — ONDANSETRON HCL 4 MG PO TABS
4.0000 mg | ORAL_TABLET | Freq: Four times a day (QID) | ORAL | Status: DC | PRN
  Administered 2017-06-05: 4 mg via ORAL
  Filled 2017-06-05: qty 1

## 2017-06-05 MED ORDER — HYDROMORPHONE HCL 2 MG PO TABS
1.0000 mg | ORAL_TABLET | ORAL | Status: DC | PRN
  Administered 2017-06-05 – 2017-06-06 (×4): 2 mg via ORAL
  Filled 2017-06-05 (×4): qty 1

## 2017-06-05 MED ORDER — SODIUM CHLORIDE FLUSH 0.9 % IV SOLN
INTRAVENOUS | Status: DC | PRN
  Administered 2017-06-05: 30 mL

## 2017-06-05 MED ORDER — ASPIRIN EC 325 MG PO TBEC: 325.0000 mg | DELAYED_RELEASE_TABLET | Freq: Every day | ORAL | 0 refills | Status: DC

## 2017-06-05 MED ORDER — SORBITOL 70 % SOLN: 30.0000 mL | Freq: Every day | Status: DC | PRN

## 2017-06-05 MED ORDER — FLEET ENEMA 7-19 GM/118ML RE ENEM: 1.0000 | ENEMA | Freq: Once | RECTAL | Status: DC | PRN

## 2017-06-05 MED ORDER — ACETAMINOPHEN 500 MG PO TABS
1000.0000 mg | ORAL_TABLET | Freq: Three times a day (TID) | ORAL | Status: DC
  Administered 2017-06-05 – 2017-06-06 (×2): 1000 mg via ORAL
  Filled 2017-06-05 (×2): qty 2

## 2017-06-05 MED ORDER — FENTANYL CITRATE (PF) 100 MCG/2ML IJ SOLN
INTRAMUSCULAR | Status: AC
  Filled 2017-06-05: qty 2

## 2017-06-05 MED ORDER — LACTATED RINGERS IV SOLN
INTRAVENOUS | Status: DC
  Administered 2017-06-05 (×2): via INTRAVENOUS

## 2017-06-05 MED ORDER — METHOCARBAMOL 500 MG PO TABS: 500.0000 mg | ORAL_TABLET | Freq: Four times a day (QID) | ORAL | 0 refills | Status: DC | PRN

## 2017-06-05 MED ORDER — FENTANYL CITRATE (PF) 100 MCG/2ML IJ SOLN
INTRAMUSCULAR | Status: AC
  Administered 2017-06-05: 50 ug via INTRAVENOUS
  Filled 2017-06-05: qty 2

## 2017-06-05 MED ORDER — FENTANYL CITRATE (PF) 250 MCG/5ML IJ SOLN
INTRAMUSCULAR | Status: AC
  Filled 2017-06-05: qty 5

## 2017-06-05 MED ORDER — PROPRANOLOL HCL 20 MG PO TABS: 10.0000 mg | ORAL_TABLET | Freq: Every day | ORAL | Status: DC | PRN

## 2017-06-05 MED ORDER — EPHEDRINE SULFATE 50 MG/ML IJ SOLN
INTRAMUSCULAR | Status: DC | PRN
  Administered 2017-06-05 (×4): 10 mg via INTRAVENOUS

## 2017-06-05 MED ORDER — SUCCINYLCHOLINE CHLORIDE 200 MG/10ML IV SOSY
PREFILLED_SYRINGE | INTRAVENOUS | Status: AC
  Filled 2017-06-05: qty 10

## 2017-06-05 MED ORDER — HYDROMORPHONE HCL 1 MG/ML IJ SOLN
0.5000 mg | INTRAMUSCULAR | Status: DC | PRN
  Administered 2017-06-05: 1 mg via INTRAVENOUS
  Filled 2017-06-05: qty 1

## 2017-06-05 MED ORDER — ONDANSETRON HCL 4 MG PO TABS: 4.0000 mg | ORAL_TABLET | Freq: Three times a day (TID) | ORAL | 0 refills | Status: DC | PRN

## 2017-06-05 MED ORDER — METOCLOPRAMIDE HCL 5 MG PO TABS
5.0000 mg | ORAL_TABLET | Freq: Three times a day (TID) | ORAL | Status: DC | PRN
  Administered 2017-06-05: 5 mg via ORAL
  Filled 2017-06-05 (×2): qty 1

## 2017-06-05 MED ORDER — EPHEDRINE 5 MG/ML INJ
INTRAVENOUS | Status: AC
  Filled 2017-06-05: qty 10

## 2017-06-05 MED ORDER — ASPIRIN EC 325 MG PO TBEC
325.0000 mg | DELAYED_RELEASE_TABLET | Freq: Every day | ORAL | Status: DC
  Administered 2017-06-06: 325 mg via ORAL
  Filled 2017-06-05: qty 1

## 2017-06-05 MED ORDER — EZETIMIBE 10 MG PO TABS
10.0000 mg | ORAL_TABLET | Freq: Every evening | ORAL | Status: DC
  Administered 2017-06-05: 10 mg via ORAL
  Filled 2017-06-05: qty 1

## 2017-06-05 MED ORDER — FENTANYL CITRATE (PF) 100 MCG/2ML IJ SOLN
50.0000 ug | Freq: Once | INTRAMUSCULAR | Status: AC
  Administered 2017-06-05: 50 ug via INTRAVENOUS

## 2017-06-05 MED ORDER — ACETAMINOPHEN 325 MG PO TABS
650.0000 mg | ORAL_TABLET | Freq: Four times a day (QID) | ORAL | Status: DC | PRN
  Administered 2017-06-06: 650 mg via ORAL
  Filled 2017-06-05: qty 2

## 2017-06-05 MED ORDER — ACETAMINOPHEN 650 MG RE SUPP: 650.0000 mg | Freq: Four times a day (QID) | RECTAL | Status: DC | PRN

## 2017-06-05 MED ORDER — KETOROLAC TROMETHAMINE 30 MG/ML IJ SOLN
INTRAMUSCULAR | Status: DC | PRN
  Administered 2017-06-05: 30 mg via INTRAMUSCULAR

## 2017-06-05 MED ORDER — HYDROMORPHONE HCL 2 MG PO TABS
ORAL_TABLET | ORAL | Status: AC
  Administered 2017-06-05: 2 mg via ORAL
  Filled 2017-06-05: qty 1

## 2017-06-05 MED ORDER — PROPOFOL 10 MG/ML IV BOLUS
INTRAVENOUS | Status: DC | PRN
  Administered 2017-06-05: 200 mg via INTRAVENOUS

## 2017-06-05 MED ORDER — ONDANSETRON HCL 4 MG/2ML IJ SOLN
INTRAMUSCULAR | Status: AC
  Filled 2017-06-05: qty 2

## 2017-06-05 MED ORDER — DIPHENHYDRAMINE HCL 12.5 MG/5ML PO ELIX: 12.5000 mg | ORAL_SOLUTION | ORAL | Status: DC | PRN

## 2017-06-05 MED ORDER — FINASTERIDE 5 MG PO TABS
5.0000 mg | ORAL_TABLET | Freq: Every day | ORAL | Status: DC
  Administered 2017-06-05 – 2017-06-06 (×2): 5 mg via ORAL
  Filled 2017-06-05 (×2): qty 1

## 2017-06-05 MED ORDER — CEFAZOLIN SODIUM-DEXTROSE 2-4 GM/100ML-% IV SOLN
2.0000 g | Freq: Four times a day (QID) | INTRAVENOUS | Status: AC
  Administered 2017-06-05 (×2): 2 g via INTRAVENOUS
  Filled 2017-06-05 (×2): qty 100

## 2017-06-05 MED ORDER — BUPIVACAINE-EPINEPHRINE (PF) 0.25% -1:200000 IJ SOLN
INTRAMUSCULAR | Status: AC
  Filled 2017-06-05: qty 30

## 2017-06-05 MED ORDER — DOCUSATE SODIUM 100 MG PO CAPS: 100.0000 mg | ORAL_CAPSULE | Freq: Two times a day (BID) | ORAL | 0 refills | Status: DC

## 2017-06-05 MED ORDER — LIDOCAINE HCL (CARDIAC) 20 MG/ML IV SOLN
INTRAVENOUS | Status: DC | PRN
  Administered 2017-06-05: 100 mg via INTRAVENOUS

## 2017-06-05 MED ORDER — PANTOPRAZOLE SODIUM 40 MG PO TBEC
40.0000 mg | DELAYED_RELEASE_TABLET | Freq: Every day | ORAL | Status: DC
  Administered 2017-06-05 – 2017-06-06 (×2): 40 mg via ORAL
  Filled 2017-06-05 (×2): qty 1

## 2017-06-05 MED ORDER — DEXAMETHASONE SODIUM PHOSPHATE 10 MG/ML IJ SOLN
10.0000 mg | Freq: Once | INTRAMUSCULAR | Status: AC
  Administered 2017-06-06: 10 mg via INTRAVENOUS
  Filled 2017-06-05: qty 1

## 2017-06-05 MED ORDER — PHENOL 1.4 % MT LIQD: 1.0000 | OROMUCOSAL | Status: DC | PRN

## 2017-06-05 MED ORDER — GLYCOPYRROLATE 0.2 MG/ML IV SOSY
PREFILLED_SYRINGE | INTRAVENOUS | Status: DC | PRN
  Administered 2017-06-05: .2 mg via INTRAVENOUS

## 2017-06-05 MED ORDER — DOCUSATE SODIUM 100 MG PO CAPS
100.0000 mg | ORAL_CAPSULE | Freq: Two times a day (BID) | ORAL | Status: DC
  Administered 2017-06-05 – 2017-06-06 (×2): 100 mg via ORAL
  Filled 2017-06-05 (×2): qty 1

## 2017-06-05 MED ORDER — BUPIVACAINE-EPINEPHRINE 0.25% -1:200000 IJ SOLN
INTRAMUSCULAR | Status: DC | PRN
  Administered 2017-06-05: 30 mL

## 2017-06-05 MED ORDER — LACTATED RINGERS IV SOLN: INTRAVENOUS | Status: DC

## 2017-06-05 SURGICAL SUPPLY — 48 items
BAG DECANTER FOR FLEXI CONT (MISCELLANEOUS) IMPLANT
BLADE SAG 18X100X1.27 (BLADE) IMPLANT
CAPT HIP TOTAL 3 ×2 IMPLANT
CLSR STERI-STRIP ANTIMIC 1/2X4 (GAUZE/BANDAGES/DRESSINGS) ×2 IMPLANT
COVER PERINEAL POST (MISCELLANEOUS) ×2 IMPLANT
COVER SURGICAL LIGHT HANDLE (MISCELLANEOUS) ×2 IMPLANT
DRAPE C-ARM 42X72 X-RAY (DRAPES) ×2 IMPLANT
DRAPE STERI IOBAN 125X83 (DRAPES) ×2 IMPLANT
DRAPE U-SHAPE 47X51 STRL (DRAPES) IMPLANT
DRSG MEPILEX BORDER 4X8 (GAUZE/BANDAGES/DRESSINGS) ×2 IMPLANT
DURAPREP 26ML APPLICATOR (WOUND CARE) ×2 IMPLANT
ELECT BLADE 4.0 EZ CLEAN MEGAD (MISCELLANEOUS) ×2
ELECT REM PT RETURN 9FT ADLT (ELECTROSURGICAL) ×2
ELECTRODE BLDE 4.0 EZ CLN MEGD (MISCELLANEOUS) ×1 IMPLANT
ELECTRODE REM PT RTRN 9FT ADLT (ELECTROSURGICAL) ×1 IMPLANT
FACESHIELD WRAPAROUND (MASK) ×4 IMPLANT
GLOVE BIO SURGEON STRL SZ7.5 (GLOVE) ×4 IMPLANT
GLOVE BIOGEL PI IND STRL 8 (GLOVE) ×2 IMPLANT
GLOVE BIOGEL PI INDICATOR 8 (GLOVE) ×2
GOWN STRL REUS W/ TWL LRG LVL3 (GOWN DISPOSABLE) ×2 IMPLANT
GOWN STRL REUS W/TWL LRG LVL3 (GOWN DISPOSABLE) ×2
KIT BASIN OR (CUSTOM PROCEDURE TRAY) ×2 IMPLANT
KIT ROOM TURNOVER OR (KITS) ×2 IMPLANT
MANIFOLD NEPTUNE II (INSTRUMENTS) ×2 IMPLANT
NDL SAFETY ECLIPSE 18X1.5 (NEEDLE) IMPLANT
NEEDLE HYPO 18GX1.5 SHARP (NEEDLE)
NEEDLE HYPO 22GX1.5 SAFETY (NEEDLE) ×2 IMPLANT
NS IRRIG 1000ML POUR BTL (IV SOLUTION) ×2 IMPLANT
PACK TOTAL JOINT (CUSTOM PROCEDURE TRAY) ×2 IMPLANT
PAD ARMBOARD 7.5X6 YLW CONV (MISCELLANEOUS) ×2 IMPLANT
SPONGE LAP 18X18 X RAY DECT (DISPOSABLE) IMPLANT
SUT MNCRL AB 4-0 PS2 18 (SUTURE) ×2 IMPLANT
SUT MON AB 2-0 CT1 36 (SUTURE) ×2 IMPLANT
SUT VIC AB 0 CT1 27 (SUTURE) ×1
SUT VIC AB 0 CT1 27XBRD ANBCTR (SUTURE) ×1 IMPLANT
SUT VIC AB 1 CT1 27 (SUTURE) ×1
SUT VIC AB 1 CT1 27XBRD ANBCTR (SUTURE) ×1 IMPLANT
SUT VLOC 180 0 24IN GS25 (SUTURE) IMPLANT
SYR 50ML LL SCALE MARK (SYRINGE) ×2 IMPLANT
SYR BULB IRRIGATION 50ML (SYRINGE) ×2 IMPLANT
SYRINGE 20CC LL (MISCELLANEOUS) IMPLANT
TAPE STRIPS DRAPE STRL (GAUZE/BANDAGES/DRESSINGS) ×2 IMPLANT
TOWEL OR 17X24 6PK STRL BLUE (TOWEL DISPOSABLE) ×2 IMPLANT
TOWEL OR 17X26 10 PK STRL BLUE (TOWEL DISPOSABLE) ×2 IMPLANT
TRAY CATH 16FR W/PLASTIC CATH (SET/KITS/TRAYS/PACK) IMPLANT
TRAY FOLEY W/METER SILVER 16FR (SET/KITS/TRAYS/PACK) IMPLANT
WATER STERILE IRR 1000ML POUR (IV SOLUTION) ×2 IMPLANT
YANKAUER SUCT BULB TIP NO VENT (SUCTIONS) ×4 IMPLANT

## 2017-06-05 NOTE — Transfer of Care (Signed)
Immediate Anesthesia Transfer of Care Note  Patient: Miguel Miller  Procedure(s) Performed: TOTAL HIP ARTHROPLASTY ANTERIOR APPROACH (Left )  Patient Location: PACU  Anesthesia Type:MAC and General  Level of Consciousness: awake, alert , oriented and patient cooperative  Airway & Oxygen Therapy: Patient Spontanous Breathing and Patient connected to nasal cannula oxygen  Post-op Assessment: Report given to RN, Post -op Vital signs reviewed and stable and Patient moving all extremities X 4  Post vital signs: Reviewed and stable  Last Vitals:  Vitals:   06/05/17 0844  BP: (!) 160/72  Pulse: 75  Resp: 18  Temp: 36.7 C  SpO2: 97%    Last Pain:  Vitals:   06/05/17 0844  TempSrc: Oral         Complications: No apparent anesthesia complications

## 2017-06-05 NOTE — Interval H&P Note (Signed)
History and Physical Interval Note:  06/05/2017 9:04 AM  Miguel Miller  has presented today for surgery, with the diagnosis of OA LEFT HIP  The various methods of treatment have been discussed with the patient and family. After consideration of risks, benefits and other options for treatment, the patient has consented to  Procedure(s): TOTAL HIP ARTHROPLASTY ANTERIOR APPROACH (Left) as a surgical intervention .  The patient's history has been reviewed, patient examined, no change in status, stable for surgery.  I have reviewed the patient's chart and labs.  Questions were answered to the patient's satisfaction.     Yonael Tulloch D

## 2017-06-05 NOTE — Op Note (Signed)
06/05/2017  12:56 PM  PATIENT:  Miguel Miller   MRN: [id number]  PRE-OPERATIVE DIAGNOSIS:  OA LEFT HIP  POST-OPERATIVE DIAGNOSIS:  OA LEFT HIP  PROCEDURE:  Procedure(s): TOTAL HIP ARTHROPLASTY ANTERIOR APPROACH  PREOPERATIVE INDICATIONS:    Patric Buckhalter is an 76 y.o. male who has a diagnosis of Primary osteoarthritis of left hip and elected for surgical management after failing conservative treatment.  The risks benefits and alternatives were discussed with the patient including but not limited to the risks of nonoperative treatment, versus surgical intervention including infection, bleeding, nerve injury, periprosthetic fracture, the need for revision surgery, dislocation, leg length discrepancy, blood clots, cardiopulmonary complications, morbidity, mortality, among others, and they were willing to proceed.     OPERATIVE REPORT     SURGEON:   MURPHY, Ernesta Amble, MD    ASSISTANT:  Roxan Hockey, PA-C, he was present and scrubbed throughout the case, critical for completion in a timely fashion, and for retraction, instrumentation, and closure.     ANESTHESIA:  General    COMPLICATIONS:  None.     COMPONENTS:  Stryker acolade fit femur size 3 with a 36 mm -2.5 head ball and a PSL acetabular shell size 52 with a  polyethylene liner    PROCEDURE IN DETAIL:   The patient was met in the holding area and  identified.  The appropriate hip was identified and marked at the operative site.  The patient was then transported to the OR  and  placed under anesthesia per that record.  At that point, the patient was  placed in the supine position and  secured to the operating room table and all bony prominences padded. He received pre-operative antibiotics    The operative lower extremity was prepped from the iliac crest to the distal leg.  Sterile draping was performed.  Time out was performed prior to incision.      Skin incision was made just 2 cm lateral to the ASIS  extending in line  with the tensor fascia lata. Electrocautery was used to control all bleeders. I dissected down sharply to the fascia of the tensor fascia lata was confirmed that the muscle fibers beneath were running posteriorly. I then incised the fascia over the superficial tensor fascia lata in line with the incision. The fascia was elevated off the anterior aspect of the muscle the muscle was retracted posteriorly and protected throughout the case. I then used electrocautery to incise the tensor fascia lata fascia control and all bleeders. Immediately visible was the fat over top of the anterior neck and capsule.  I removed the anterior fat from the capsule and elevated the rectus muscle off of the anterior capsule. I then removed a large time of capsule. The retractors were then placed over the anterior acetabulum as well as around the superior and inferior neck.  I then removed a section of the femoral neck and a napkin ring fashion. Then used the power course to remove the femoral head from the acetabulum and thoroughly irrigated the acetabulum. I sized the femoral head.    I then exposed the deep acetabulum, cleared out any tissue including the ligamentum teres.   After adequate visualization, I excised the labrum, and then sequentially reamed.  I then impacted the acetabular implant into place using fluoroscopy for guidance.  Appropriate version and inclination was confirmed clinically matching their bony anatomy, and with fluoroscopy.  I placed a 20 mm screw in the posterior/superio position with an excellent bite.  I then placed the polyethylene liner in place  I then adducted the leg and released the external rotators from the posterior femur allowing it to be easily delivered up lateral and anterior to the acetabulum for preparation of the femoral canal.    I then prepared the proximal femur using the cookie-cutter and then sequentially reamed and broached.  A trial broach, neck, and head was  utilized, and I reduced the hip and used floroscopy to assess the neck length and femoral implant.  I then impacted the femoral prosthesis into place into the appropriate version. The hip was then reduced and fluoroscopy confirmed appropriate position. Leg lengths were restored.  I then irrigated the hip copiously again with, and repaired the fascia with Vicryl, followed by monocryl for the subcutaneous tissue, Monocryl for the skin, Steri-Strips and sterile gauze. The patient was then awakened and returned to PACU in stable and satisfactory condition. There were no complications.  POST OPERATIVE PLAN: WBAT, DVT px: SCD's/TED, ambulation and chemical dvt px  Timothy Murphy, MD Orthopedic Surgeon [phone]     

## 2017-06-05 NOTE — Anesthesia Procedure Notes (Signed)
Procedure Name: Intubation Date/Time: 06/05/2017 10:23 AM Performed by: Carney Living Pre-anesthesia Checklist: Patient identified, Emergency Drugs available, Suction available and Patient being monitored Patient Re-evaluated:Patient Re-evaluated prior to induction Oxygen Delivery Method: Circle system utilized Preoxygenation: Pre-oxygenation with 100% oxygen Induction Type: IV induction Ventilation: Mask ventilation without difficulty Laryngoscope Size: Mac and 4 Grade View: Grade I Tube type: Oral Tube size: 7.5 mm Number of attempts: 1 Airway Equipment and Method: Stylet Placement Confirmation: ETT inserted through vocal cords under direct vision,  positive ETCO2 and breath sounds checked- equal and bilateral Secured at: 23 cm Tube secured with: Tape Dental Injury: Teeth and Oropharynx as per pre-operative assessment  Comments: Alverda Skeans

## 2017-06-05 NOTE — Anesthesia Preprocedure Evaluation (Addendum)
Anesthesia Evaluation  Patient identified by MRN, date of birth, ID band Patient awake    Reviewed: Allergy & Precautions, NPO status , Patient's Chart, lab work & pertinent test results  Airway Mallampati: II  TM Distance: >3 FB Neck ROM: Full    Dental no notable dental hx. (+) Chipped,    Pulmonary neg pulmonary ROS,    Pulmonary exam normal breath sounds clear to auscultation       Cardiovascular hypertension, Pt. on medications and Pt. on home beta blockers + CAD and +CHF  Normal cardiovascular exam Rhythm:Regular Rate:Normal  - Normal LV size and systolic function, EF 00%. Normal RV size and   systolic function. No significant valvular abnormalities. 08/2016   Neuro/Psych negative neurological ROS  negative psych ROS   GI/Hepatic negative GI ROS, Neg liver ROS, GERD  ,  Endo/Other  diabetes, Type 2  Renal/GU negative Renal ROS  negative genitourinary   Musculoskeletal negative musculoskeletal ROS (+) Arthritis , Osteoarthritis,    Abdominal   Peds negative pediatric ROS (+)  Hematology negative hematology ROS (+) Blood dyscrasia, ,   Anesthesia Other Findings  Echo 08/10/16:  - Left ventricle: The cavity size was normal. Wall thickness was normal. The estimated ejection fraction was 55%. Although no diagnostic regional wall motion abnormality was identified, this possibility cannot be completely excluded on the basis of this study. Doppler parameters are consistent with abnormal left ventricular relaxation (grade 1 diastolic dysfunction). - Aortic valve: There was no stenosis. There was trivial regurgitation. - Mitral valve: There was no significant regurgitation. - Right ventricle: The cavity size was normal. Systolic function was normal. - Pulmonary arteries: No complete TR doppler jet so unable to estimate PA systolic pressure. - Inferior vena cava: The vessel was normal in size. The respirophasic  diameter changes were in the normal range (>= 50%), consistent with normal central venous pressure. - Impressions: Normal LV size and systolic function, EF 93%. Normal RV size andsystolic function. No significant valvular abnormalities.  Nuclear stress test 08/02/16:   Nuclear stress EF: 44%. Visually, the EF appears to be closer to 55% and is normal . Suggest echo for further assessment of LV EF if not already done .  There was no ST segment deviation noted during stress.  The study is normal.  This is a low risk study.   Reproductive/Obstetrics negative OB ROS                            Anesthesia Physical  Anesthesia Plan  ASA: III  Anesthesia Plan: General   Post-op Pain Management:    Induction: Intravenous  PONV Risk Score and Plan: 2 and Ondansetron, Dexamethasone and Treatment may vary due to age or medical condition  Airway Management Planned: LMA and Oral ETT  Additional Equipment:   Intra-op Plan:   Post-operative Plan:   Informed Consent: I have reviewed the patients History and Physical, chart, labs and discussed the procedure including the risks, benefits and alternatives for the proposed anesthesia with the patient or authorized representative who has indicated his/her understanding and acceptance.   Dental advisory given  Plan Discussed with: CRNA and Surgeon  Anesthesia Plan Comments:        Anesthesia Quick Evaluation

## 2017-06-05 NOTE — Anesthesia Postprocedure Evaluation (Signed)
Anesthesia Post Note  Patient: Miguel Miller  Procedure(s) Performed: TOTAL HIP ARTHROPLASTY ANTERIOR APPROACH (Left )     Patient location during evaluation: PACU Anesthesia Type: General Level of consciousness: awake and alert Pain management: pain level controlled Vital Signs Assessment: post-procedure vital signs reviewed and stable Respiratory status: spontaneous breathing, nonlabored ventilation, respiratory function stable and patient connected to nasal cannula oxygen Cardiovascular status: blood pressure returned to baseline and stable Postop Assessment: no apparent nausea or vomiting Anesthetic complications: no    Last Vitals:  Vitals:   06/05/17 1300 06/05/17 1315  BP: (!) (P) 145/77 (!) 147/88  Pulse: (P) 62 67  Resp: (P) 16 18  Temp:    SpO2: (P) 100% 100%    Last Pain:  Vitals:   06/05/17 1315  TempSrc:   PainSc: 5                  Slater Mcmanaman

## 2017-06-05 NOTE — Progress Notes (Signed)
Arrived to pacu with iv infusing

## 2017-06-05 NOTE — Evaluation (Signed)
Physical Therapy Evaluation Patient Details Name: Miguel Miller MRN: [id number] DOB: 01/21/1941 Today's Date: 06/05/2017   History of Present Illness  Pt is a 76 y/o male s/p elective L THA, direct anterior approach. PMH includes CAD, CHF, HTN, DM, back surgery, and bilat TKA.   Clinical Impression  Pt is s/p surgery above with deficits below. PTA, pt was independent with functional mobility. Upon eval, pt presenting with post op pain and weakness, and slightly decreased balance. Required min guard for mobility. Reported he has all necessary DME and will have assist from wife as needed. Follow up therapy as arranged by MD. Will continue to follow acutely to maximize functional mobility independence and safety.     Follow Up Recommendations DC plan and follow up therapy as arranged by surgeon;Supervision for mobility/OOB    Equipment Recommendations  None recommended by PT (has all DME)    Recommendations for Other Services       Precautions / Restrictions Precautions Precautions: None Precaution Comments: Gave THA handout and reviewed supine ther ex.  Restrictions Weight Bearing Restrictions: Yes LLE Weight Bearing: Weight bearing as tolerated      Mobility  Bed Mobility Overal bed mobility: Needs Assistance Bed Mobility: Supine to Sit     Supine to sit: Supervision     General bed mobility comments: Supervision for safety. Required use of bed rails.  Transfers Overall transfer level: Needs assistance Equipment used: Rolling walker (2 wheeled) Transfers: Sit to/from Stand Sit to Stand: Min guard         General transfer comment: Min guard for safety. Verbal cues for safe hand placement.   Ambulation/Gait Ambulation/Gait assistance: Min guard Ambulation Distance (Feet): 20 Feet Assistive device: Rolling walker (2 wheeled) Gait Pattern/deviations: Step-to pattern;Decreased step length - right;Decreased step length - left;Decreased weight shift to left Gait  velocity: Decreased Gait velocity interpretation: Below normal speed for age/gender General Gait Details: Slow, antalgic gait secondary to post op pain and weakness. Verbal cues for sequencing with RW.   Stairs            Wheelchair Mobility    Modified Rankin (Stroke Patients Only)       Balance Overall balance assessment: Needs assistance Sitting-balance support: No upper extremity supported;Feet supported Sitting balance-Leahy Scale: Good     Standing balance support: Bilateral upper extremity supported;During functional activity Standing balance-Leahy Scale: Poor Standing balance comment: Reliant on BUE support                              Pertinent Vitals/Pain Pain Assessment: 0-10 Pain Score: 2  Pain Location: L hip  Pain Descriptors / Indicators: Aching;Operative site guarding Pain Intervention(s): Limited activity within patient's tolerance;Monitored during session;Repositioned    Home Living Family/patient expects to be discharged to:: Private residence Living Arrangements: Spouse/significant other Available Help at Discharge: Family;Available 24 hours/day Type of Home: Other(Comment) (townhouse ) Home Access: Stairs to enter Entrance Stairs-Rails: Right;Left;Can reach both Entrance Stairs-Number of Steps: 10 (5 from parking lot; 5 into house ) Home Layout: One level Home Equipment: [place] - 2 wheels;Cane - single point;Bedside commode      Prior Function Level of Independence: Independent         Comments: On hiking trails reported he used walking stick, but was independent otherwise.      Hand Dominance        Extremity/Trunk Assessment   Upper Extremity Assessment Upper Extremity Assessment: Defer to OT evaluation  Lower Extremity Assessment Lower Extremity Assessment: RLE deficits/detail RLE Deficits / Details: Numbness reported in anterior thigh. Deficits consistent with post op pain and weakness. Able to perform ther ex  below.     Cervical / Trunk Assessment Cervical / Trunk Assessment: Normal  Communication   Communication: No difficulties  Cognition Arousal/Alertness: Awake/alert Behavior During Therapy: WFL for tasks assessed/performed Overall Cognitive Status: Within Functional Limits for tasks assessed                                        General Comments      Exercises Total Joint Exercises Ankle Circles/Pumps: AROM;Both;20 reps Quad Sets: AROM;Left;10 reps Short Arc Quad: AROM;Left;10 reps Heel Slides: AROM;Left;10 reps (partial range )   Assessment/Plan    PT Assessment Patient needs continued PT services  PT Problem List Decreased strength;Decreased range of motion;Decreased balance;Decreased mobility;Decreased knowledge of use of DME;Decreased knowledge of precautions;Pain       PT Treatment Interventions DME instruction;Gait training;Stair training;Functional mobility training;Therapeutic activities;Therapeutic exercise;Balance training;Neuromuscular re-education;Patient/family education    PT Goals (Current goals can be found in the Care Plan section)  Acute Rehab PT Goals Patient Stated Goal: to go home  PT Goal Formulation: With patient Time For Goal Achievement: 06/12/17 Potential to Achieve Goals: Good    Frequency 7X/week   Barriers to discharge        Co-evaluation               AM-PAC PT "6 Clicks" Daily Activity  Outcome Measure Difficulty turning over in bed (including adjusting bedclothes, sheets and blankets)?: None Difficulty moving from lying on back to sitting on the side of the bed? : None Difficulty sitting down on and standing up from a chair with arms (e.g., wheelchair, bedside commode, etc,.)?: Unable Help needed moving to and from a bed to chair (including a wheelchair)?: A Little Help needed walking in hospital room?: A Little Help needed climbing 3-5 steps with a railing? : A Little 6 Click Score: 18    End of Session  Equipment Utilized During Treatment: Gait belt Activity Tolerance: Patient tolerated treatment well Patient left: in chair;with call bell/phone within reach Nurse Communication: Mobility status PT Visit Diagnosis: Other abnormalities of gait and mobility (R26.89);Pain Pain - Right/Left: Left Pain - part of body: Hip    Time: 8127-5170 PT Time Calculation (min) (ACUTE ONLY): 28 min   Charges:   PT Evaluation $PT Eval Low Complexity: 1 Low PT Treatments $Gait Training: 8-22 mins   PT G Codes:        Leighton Ruff, PT, DPT  Acute Rehabilitation Services  Pager: [phone]   Rudean Hitt 06/05/2017, 5:36 PM

## 2017-06-05 NOTE — Progress Notes (Signed)
Report given to erika rn as caregiver 

## 2017-06-05 NOTE — Discharge Instructions (Signed)

## 2017-06-06 ENCOUNTER — Encounter (HOSPITAL_COMMUNITY): Payer: Self-pay | Admitting: Orthopedic Surgery

## 2017-06-06 MED ORDER — OMEPRAZOLE 20 MG PO CPDR: 20.0000 mg | DELAYED_RELEASE_CAPSULE | Freq: Every day | ORAL | 0 refills | Status: DC

## 2017-06-06 MED ORDER — DIAZEPAM 5 MG PO TABS: 5.0000 mg | ORAL_TABLET | Freq: Three times a day (TID) | ORAL | 0 refills | Status: DC | PRN

## 2017-06-06 NOTE — Progress Notes (Signed)
Pt ready for d/c home today per MD. Pt met PT/OT goals, has needed equipment. Discharge instructions and prescriptions reviewed with pt and wife, all questions answered. Pt assisted to car with all belongings.   [place], Jerry Caras

## 2017-06-06 NOTE — Therapy (Signed)
Occupational Therapy Evaluation and Discharge Patient Details Name: Miguel Miller MRN: [id number] DOB: [birth year] Today's Date: 06/06/2017    History of Present Illness Pt is a 76 y/o male s/p elective L THA, direct anterior approach. PMH includes CAD, CHF, HTN, DM, back surgery, and bilat TKA.    Clinical Impression   Pt reports being independent in ADLs with use of a cane during long hikes PTA. Currently, pt requires min guard during functional mobility and transfers at RW level and supervision for all other ADL tasks. Pt reports wife is available 24 hours upon d/c to provide assistance as needed. Pt completed all acute OT education and is safe to d/c home with supervision from wife. No further acute OT needs. OT signing off.     Follow Up Recommendations  No OT follow up;Supervision/Assistance - 24 hour    Equipment Recommendations  None recommended by OT (Pt has all needed DME)    Recommendations for Other Services       Precautions / Restrictions Precautions Precautions: None Precaution Comments: Completed HEP Restrictions Weight Bearing Restrictions: Yes LLE Weight Bearing: Weight bearing as tolerated      Mobility Bed Mobility               General bed mobility comments: Pt sitting in chair upon OT arrival  Transfers Overall transfer level: Needs assistance Equipment used: Rolling walker (2 wheeled) Transfers: Sit to/from Stand Sit to Stand: Min guard         General transfer comment: Min guard for safety     Balance Overall balance assessment: Needs assistance Sitting-balance support: No upper extremity supported;Feet supported Sitting balance-Leahy Scale: Good     Standing balance support: Bilateral upper extremity supported;During functional activity Standing balance-Leahy Scale: Fair Standing balance comment: Pt able to stand and don UB clothing with no UE support and no LOB                            ADL either performed or  assessed with clinical judgement   ADL Overall ADL's : Needs assistance/impaired     Grooming: Supervision/safety;Standing (With RW )   Upper Body Bathing: Supervision/ safety;Sitting   Lower Body Bathing: Supervison/ safety;Sit to/from stand Lower Body Bathing Details (indicate cue type and reason): Pt advised to complete bathing while sitting in shower and for his wife to be available to assist as needed initially. Upper Body Dressing : Supervision/safety;Standing Upper Body Dressing Details (indicate cue type and reason): Pt able to don shirt in standing with no LOB and supervision for safety.  Lower Body Dressing: Supervision/safety;Sit to/from stand Lower Body Dressing Details (indicate cue type and reason): Pt able to don LB clothing with supervision for safety. Pt advised to complete LB dressing in sitting for safety  Toilet Transfer: Min guard;Ambulation;Comfort height toilet;RW   Toileting- Clothing Manipulation and Hygiene: Supervision/safety;Sit to/from stand   Tub/ Shower Transfer: Min guard;Cueing for sequencing;Ambulation;3 in 1;Rolling walker Tub/Shower Transfer Details (indicate cue type and reason): Pt educated on compensatory technique to complete tub shower transfer with RW and 3in1. Pt able to complete transfer with verbal cues for sequencing and supervision for safety.  Functional mobility during ADLs: Min guard;Rolling walker General ADL Comments: Recommended pt sit to complete bathing and dressing tasks initially.       Vision         Perception     Praxis      Pertinent Vitals/Pain Pain Assessment: 0-10 Pain  Score: 3  Pain Location: L hip  Pain Descriptors / Indicators: Aching;Operative site guarding Pain Intervention(s): Premedicated before session;Monitored during session     Hand Dominance Right   Extremity/Trunk Assessment Upper Extremity Assessment Upper Extremity Assessment: Overall WFL for tasks assessed   Lower Extremity Assessment Lower  Extremity Assessment: Defer to PT evaluation   Cervical / Trunk Assessment Cervical / Trunk Assessment: Normal   Communication Communication Communication: No difficulties   Cognition Arousal/Alertness: Awake/alert Behavior During Therapy: WFL for tasks assessed/performed Overall Cognitive Status: Within Functional Limits for tasks assessed                                     General Comments  Pt's wife present during evalaution and particiapted in all education. Pt reports no concerns about d/c home.     Exercises    Shoulder Instructions      Home Living Family/patient expects to be discharged to:: Private residence Living Arrangements: Spouse/significant other Available Help at Discharge: Family;Available 24 hours/day Type of Home: Other(Comment) Home Access: Stairs to enter Entrance Stairs-Number of Steps: 10 Entrance Stairs-Rails: Right;Left;Can reach both Home Layout: One level     Bathroom Shower/Tub: Tub/shower unit;Curtain   Bathroom Toilet: Handicapped height Bathroom Accessibility: Yes How Accessible: Accessible via walker Home Equipment: [place] - 2 wheels;Cane - single point;Bedside commode          Prior Functioning/Environment Level of Independence: Independent        Comments: On hiking trails reported he used walking stick, but was independent otherwise.         OT Problem List:        OT Treatment/Interventions:      OT Goals(Current goals can be found in the care plan section) Acute Rehab OT Goals Patient Stated Goal: to go home  OT Goal Formulation: With patient  OT Frequency:     Barriers to D/C:            Co-evaluation              AM-PAC PT "6 Clicks" Daily Activity     Outcome Measure Help from another person eating meals?: None Help from another person taking care of personal grooming?: None Help from another person toileting, which includes using toliet, bedpan, or urinal?: A Little Help from  another person bathing (including washing, rinsing, drying)?: A Little Help from another person to put on and taking off regular upper body clothing?: A Little Help from another person to put on and taking off regular lower body clothing?: A Little 6 Click Score: 20   End of Session Equipment Utilized During Treatment: Gait belt;Rolling walker Nurse Communication: Mobility status  Activity Tolerance: Patient tolerated treatment well Patient left: in chair;with call bell/phone within reach;with family/visitor present  OT Visit Diagnosis: Unsteadiness on feet (R26.81);Pain Pain - Right/Left: Left Pain - part of body: Hip                Time: 1101-1115 OT Time Calculation (min): 14 min Charges:  OT General Charges $OT Visit: 1 Visit OT Evaluation $OT Eval Moderate Complexity: 1 Mod G-Codes:     Boykin Peek, OTS [phone]   Boykin Peek 06/06/2017, 1:32 PM

## 2017-06-06 NOTE — Discharge Summary (Signed)
Discharge Summary  Patient ID: Miguel Miller MRN: [id number] DOB/AGE: 10/13/1940 76 y.o.  Admit date: 06/05/2017 Discharge date: 06/06/2017  Admission Diagnoses:  Primary osteoarthritis of left hip  Discharge Diagnoses:  Principal Problem:   Primary osteoarthritis of left hip Active Problems:   CAD (coronary artery disease)   CHF (congestive heart failure) (HCC)   Essential hypertension   Diabetes mellitus type 2 in obese (HCC)   Hypercholesterolemia   Multinodular goiter   BPH (benign prostatic hyperplasia)   Low testosterone   Past Medical History:  Diagnosis Date  . Actinic keratoses   . Arteriosclerosis   . BPH (benign prostatic hyperplasia)   . Diabetes mellitus type 2, controlled, without complications ([place])    diet controlled  . Diverticulosis   . DJD (degenerative joint disease)   . Dyslipidemia   . Eczema   . ED (erectile dysfunction)   . Essential tremor   . GERD (gastroesophageal reflux disease)   . Glaucoma    h/o retineal tear; pt denies glaucoma(09/29/16)  . Hypertension   . Low testosterone   . Peripheral vascular disease ([place])    ? PAD  . Thrombocytopenia ([place])   . Thyroid goiter     Surgeries: Procedure(s): TOTAL HIP ARTHROPLASTY ANTERIOR APPROACH on 06/05/2017   Consultants (if any):   Discharged Condition: Improved  Hospital Course: Miguel Miller is an 76 y.o. male who was admitted 06/05/2017 with a diagnosis of Primary osteoarthritis of left hip and went to the operating room on 06/05/2017 and underwent the above named procedures.    He was given perioperative antibiotics:  Anti-infectives    Start     Dose/Rate Route Frequency Ordered Stop   06/05/17 1800  ceFAZolin (ANCEF) IVPB 2g/100 mL premix     2 g 200 mL/hr over 30 Minutes Intravenous Every 6 hours 06/05/17 1607 06/06/17 0007   06/05/17 1000  ceFAZolin (ANCEF) IVPB 2g/100 mL premix     2 g 200 mL/hr over 30 Minutes Intravenous To [facility] 06/04/17 1417 06/05/17 1026     .  He was given sequential compression devices, early ambulation, and ASA for DVT prophylaxis.  He benefited maximally from the hospital stay and there were no complications.    Recent vital signs:  Vitals:   06/06/17 0005 06/06/17 0431  BP: 114/62 140/63  Pulse: 72 60  Resp: 17 17  Temp: (!) 97.4 F (36.3 C) 98.2 F (36.8 C)  SpO2: 93% 95%    Recent laboratory studies:  Lab Results  Component Value Date   HGB 15.9 05/25/2017   HGB 16.0 10/02/2016   HGB 14.5 03/27/2011   Lab Results  Component Value Date   WBC 4.8 05/25/2017   PLT 134 (L) 05/25/2017   No results found for: INR Lab Results  Component Value Date   NA 136 05/25/2017   K 4.6 05/25/2017   CL 105 05/25/2017   CO2 24 05/25/2017   BUN 27 (H) 05/25/2017   CREATININE 1.12 05/25/2017   GLUCOSE 180 (H) 05/25/2017    Discharge Medications:   Allergies as of 06/06/2017      Reactions   Tramadol Other (See Comments)   Hallucinations -He thinks-just remembered had a problem   Hydrocodone Bitartrate [hydrocodone] Itching, Other (See Comments)   Oxycodone Hives   Statins Other (See Comments)   Muscle aches   Welchol [colesevelam Hcl] Other (See Comments)   constipation      Medication List    STOP taking these medications   traMADol  50 MG tablet Commonly known as:  ULTRAM     TAKE these medications   acetaminophen 500 MG tablet Commonly known as:  TYLENOL Take 1,000 mg by mouth every 6 (six) hours as needed for moderate pain or headache.   ANDROGEL PUMP 20.25 MG/ACT (1.62%) Gel Generic drug:  Testosterone Apply 2 application topically daily.   aspirin EC 325 MG tablet Take 1 tablet (325 mg total) by mouth daily. For 30 days post op for DVT Prophylaxis   celecoxib 200 MG capsule Commonly known as:  CELEBREX Take 1 capsule (200 mg total) by mouth 2 (two) times daily. For 2 weeks post op.  Discontinue Ibuprofen when taking this medicine.   diazepam 5 MG tablet Commonly known as:   VALIUM Take 1 tablet (5 mg total) by mouth every 8 (eight) hours as needed for muscle spasms. Do not take with other muscle spasm medicine or pain medication. What changed:  how much to take  when to take this  reasons to take this  additional instructions   docusate sodium 100 MG capsule Commonly known as:  COLACE Take 1 capsule (100 mg total) by mouth 2 (two) times daily. To prevent constipation while taking pain medication.   finasteride 5 MG tablet Commonly known as:  PROSCAR Take 5 mg by mouth daily.   HYDROmorphone 2 MG tablet Commonly known as:  DILAUDID Take 0.5-1 tablets (1-2 mg total) by mouth every 6 (six) hours as needed for severe pain (For Breakthrough pain only). What changed:  how much to take  reasons to take this   lisinopril 20 MG tablet Commonly known as:  PRINIVIL,ZESTRIL Take 20 mg by mouth every evening.   Melatonin 3 MG Caps Take 3 mg by mouth at bedtime.   methocarbamol 500 MG tablet Commonly known as:  ROBAXIN Take 1 tablet (500 mg total) by mouth every 6 (six) hours as needed for muscle spasms.   NON FORMULARY Inject 0.5 mLs as directed as needed (ED).   omeprazole 20 MG capsule Commonly known as:  PRILOSEC Take 1 capsule (20 mg total) by mouth daily. 30 days for gastroprotection while taking Aspirin. What changed:  when to take this  reasons to take this  additional instructions   ondansetron 4 MG tablet Commonly known as:  ZOFRAN Take 1 tablet (4 mg total) by mouth every 8 (eight) hours as needed for nausea or vomiting.   propranolol 10 MG tablet Commonly known as:  INDERAL Take 10 mg by mouth daily as needed (tremors).   ZETIA 10 MG tablet Generic drug:  ezetimibe Take 10 mg by mouth every evening.   zolpidem 10 MG tablet Commonly known as:  AMBIEN Take 10 mg by mouth at bedtime as needed for sleep.       Diagnostic Studies: Dg C-arm 1-60 Min  Result Date: 06/05/2017 CLINICAL DATA:  Status post anterior approach  left total hip joint replacement. Reported fluoro time is 12 seconds. EXAM: OPERATIVE left HIP (WITH PELVIS IF PERFORMED) to VIEWS TECHNIQUE: Fluoroscopic spot image(s) were submitted for interpretation post-operatively. COMPARISON:  MRI of the left hip of March 26, 2017 FINDINGS: The patient has undergone left total hip joint prosthesis placement. Radiographic positioning of the prosthetic components is good. The interface with the native bone appears normal. IMPRESSION: There is no immediate complication following left total hip joint prosthesis placement. Electronically Signed   By: David  Martinique M.D.   On: 06/05/2017 11:46   Dg Hip Operative Unilat With Pelvis Left  Result Date: 06/05/2017 CLINICAL DATA:  Status post anterior approach left total hip joint replacement. Reported fluoro time is 12 seconds. EXAM: OPERATIVE left HIP (WITH PELVIS IF PERFORMED) to VIEWS TECHNIQUE: Fluoroscopic spot image(s) were submitted for interpretation post-operatively. COMPARISON:  MRI of the left hip of March 26, 2017 FINDINGS: The patient has undergone left total hip joint prosthesis placement. Radiographic positioning of the prosthetic components is good. The interface with the native bone appears normal. IMPRESSION: There is no immediate complication following left total hip joint prosthesis placement. Electronically Signed   By: David  Martinique M.D.   On: 06/05/2017 11:46    Disposition: 01-Home or Self Care    Follow-up Information    Renette Butters, MD Follow up.   Specialty:  Orthopedic Surgery Contact information: [address] [place] [address] [phone]            Signed: Prudencio Burly III PA-C 06/06/2017, 7:09 AM

## 2017-06-06 NOTE — Progress Notes (Signed)
   Assessment / Plan: 1 Day Post-Op  S/P Procedure(s) (LRB): TOTAL HIP ARTHROPLASTY ANTERIOR APPROACH (Left) by Dr. Ernesta Amble. Percell Miller on 06/05/17  Principal Problem:   Primary osteoarthritis of left hip Active Problems:   CAD (coronary artery disease)   CHF (congestive heart failure) (HCC)   Essential hypertension   Diabetes mellitus type 2 in obese (HCC)   Hypercholesterolemia   Multinodular goiter   BPH (benign prostatic hyperplasia)   Low testosterone   Advance diet Up with therapy D/C IV fluids Discharge home with home health Incentive Spirometry Elevate and apply ice  Weight Bearing: Weight Bearing as Tolerated (WBAT)  Dressings: Maintain.  VTE prophylaxis: Aspirin, SCDs, ambulation Dispo: Home today after therapy  Subjective: Patient reports pain as mild. Pain controlled.  Tolerating diet.  Urinating.   No CP, SOB. OOB walking.  Objective:   VITALS:   Vitals:   06/05/17 1500 06/05/17 2016 06/06/17 0005 06/06/17 0431  BP: (!) 150/77 140/78 114/62 140/63  Pulse: 70 83 72 60  Resp: 16 18 17 17   Temp: 97.9 F (36.6 C) 98 F (36.7 C) (!) 97.4 F (36.3 C) 98.2 F (36.8 C)  TempSrc: Oral  Oral Oral  SpO2: 99% 97% 93% 95%  Weight:      Height:       CBC Latest Ref Rng & Units 05/25/2017 10/02/2016 03/27/2011  WBC 4.0 - 10.5 K/uL 4.8 - 8.6  Hemoglobin 13.0 - 17.0 g/dL 15.9 16.0 14.5  Hematocrit 39.0 - 52.0 % 50.7 47.0 43.5  Platelets 150 - 400 K/uL 134(L) - 143(L)   BMP Latest Ref Rng & Units 05/25/2017 10/02/2016 03/25/2011  Glucose 65 - 99 mg/dL 180(H) 141(H) 163(H)  BUN 6 - 20 mg/dL 27(H) 23(H) 25(H)  Creatinine 0.61 - 1.24 mg/dL 1.12 1.00 1.21  Sodium 135 - 145 mmol/L 136 140 135  Potassium 3.5 - 5.1 mmol/L 4.6 4.0 4.6  Chloride 101 - 111 mmol/L 105 102 95(L)  CO2 22 - 32 mmol/L 24 - 28  Calcium 8.9 - 10.3 mg/dL 9.3 - 8.7   Intake/Output      10/02 0701 - 10/03 0700 10/03 0701 - 10/04 0700   P.O. 720    I.V. (mL/kg) 1300 (12.1)    IV Piggyback 300      Total Intake(mL/kg) 2320 (21.7)    Blood 300    Total Output 300     Net +2020          Urine Occurrence 4 x      Physical Exam: General: NAD.  Upright in chair.  Calm, conversant. Neurologically intact MSK Neurovascularly intact Sensation intact distally Feet warm Dorsiflexion/Plantar flexion intact Incision: dressing C/D/I   Prudencio Burly III, PA-C 06/06/2017, 7:04 AM

## 2017-06-06 NOTE — Plan of Care (Signed)
Problem: Pain Managment: Goal: General experience of comfort will improve Patient voices understanding of pain scale and calls for pain medication when needed   

## 2017-06-06 NOTE — Care Management Note (Signed)
Case Management Note  Patient Details  Name: Itzel Lowrimore MRN: [id number] Date of Birth: 03/21/1941  Subjective/Objective:    76 yr old gentleman s/p left total hip arthroplasty.                Action/Plan: Patient was preoperatively setup [facility], has RW and 3in1. Will have family support at discharge.   Expected Discharge Date:  06/06/17               Expected Discharge Plan:  [place]  In-House Referral:  NA  Discharge planning Services  CM Consult  Post Acute Care Choice:  Durable Medical Equipment, Home Health Choice offered to:  Patient  DME Arranged:  3-N-1, Walker rolling DME Agency:  [employer]  HH Arranged:  PT [place]:  [facility] [employer]  Status of Service:  Completed, signed off  If discussed at [employer], dates discussed:    Additional Comments:  Ninfa Meeker, RN 06/06/2017, 10:58 AM

## 2017-06-06 NOTE — Progress Notes (Signed)
Physical Therapy Treatment Patient Details Name: Miguel Miller MRN: [id number] DOB: 28-Jan-1941 Today's Date: 06/06/2017    History of Present Illness Pt is a 76 y/o male s/p elective L THA, direct anterior approach. PMH includes CAD, CHF, HTN, DM, back surgery, and bilat TKA.     PT Comments    Pt is mobilizing well and progressing toward goals. Pt increased ambulation distance and performed stair negotiation with min guard. Reviewed standing HEP with pt. Pt expected to d/c today.   Follow Up Recommendations  DC plan and follow up therapy as arranged by surgeon;Supervision for mobility/OOB     Equipment Recommendations  None recommended by PT (has all DME)    Recommendations for Other Services       Precautions / Restrictions Precautions Precautions: None Precaution Comments: Completed HEP Restrictions Weight Bearing Restrictions: Yes LLE Weight Bearing: Weight bearing as tolerated    Mobility  Bed Mobility               General bed mobility comments: in chair on arrival  Transfers Overall transfer level: Needs assistance Equipment used: Rolling walker (2 wheeled) Transfers: Sit to/from Stand Sit to Stand: Min guard         General transfer comment: Cues for hand placement. Min guard for safety  Ambulation/Gait Ambulation/Gait assistance: Min guard Ambulation Distance (Feet): 250 Feet Assistive device: Rolling walker (2 wheeled) Gait Pattern/deviations: Step-to pattern;Decreased step length - right;Decreased step length - left;Decreased weight shift to left Gait velocity: Decreased Gait velocity interpretation: Below normal speed for age/gender General Gait Details: Slow, antalgic gait secondary to post op pain and weakness. Verbal cues for walker proximity   Stairs Stairs: Yes   Stair Management: Two rails;Step to pattern;Forwards Number of Stairs: 6 General stair comments: Min cueing required for stair negotiation as pt recalled correct technique  from previous two knee replacements. Min guard for safety.  Wheelchair Mobility    Modified Rankin (Stroke Patients Only)       Balance Overall balance assessment: Needs assistance Sitting-balance support: No upper extremity supported;Feet supported Sitting balance-Leahy Scale: Good     Standing balance support: Bilateral upper extremity supported;During functional activity Standing balance-Leahy Scale: Poor Standing balance comment: Reliant on BUE support                             Cognition Arousal/Alertness: Awake/alert Behavior During Therapy: WFL for tasks assessed/performed Overall Cognitive Status: Within Functional Limits for tasks assessed                                        Exercises Total Joint Exercises Hip ABduction/ADduction: AROM;Left;10 reps;Standing Long Arc Quad: AROM;Left;10 reps;Seated Knee Flexion: AROM;Left;10 reps;Standing Marching in Standing: AROM;Left;10 reps;Standing Standing Hip Extension: AROM;Left;10 reps;Standing    General Comments        Pertinent Vitals/Pain Pain Assessment: 0-10 Pain Score: 3  Pain Location: L hip  Pain Descriptors / Indicators: Aching;Operative site guarding Pain Intervention(s): Monitored during session;Limited activity within patient's tolerance;Premedicated before session    Home Living                      Prior Function            PT Goals (current goals can now be found in the care plan section) Acute Rehab PT Goals Patient Stated Goal: to go  home  PT Goal Formulation: With patient Time For Goal Achievement: 06/12/17 Potential to Achieve Goals: Good Progress towards PT goals: Progressing toward goals    Frequency    7X/week      PT Plan Current plan remains appropriate    Co-evaluation              AM-PAC PT "6 Clicks" Daily Activity  Outcome Measure  Difficulty turning over in bed (including adjusting bedclothes, sheets and blankets)?: A  Little Difficulty moving from lying on back to sitting on the side of the bed? : A Little Difficulty sitting down on and standing up from a chair with arms (e.g., wheelchair, bedside commode, etc,.)?: Unable Help needed moving to and from a bed to chair (including a wheelchair)?: A Little Help needed walking in hospital room?: A Little Help needed climbing 3-5 steps with a railing? : A Little 6 Click Score: 16    End of Session Equipment Utilized During Treatment: Gait belt Activity Tolerance: Patient tolerated treatment well Patient left: in chair;with call bell/phone within reach;with family/visitor present Nurse Communication: Mobility status PT Visit Diagnosis: Other abnormalities of gait and mobility (R26.89);Pain Pain - Right/Left: Left Pain - part of body: Hip     Time: 9381-0175 PT Time Calculation (min) (ACUTE ONLY): 19 min  Charges:  $Gait Training: 8-22 mins                    G Codes:       Benjiman Core, [place] Pager [phone] Acute Rehab   Allena Katz 06/06/2017, 10:25 AM

## 2017-11-08 ENCOUNTER — Other Ambulatory Visit: Payer: Self-pay | Admitting: Neurosurgery

## 2017-11-08 DIAGNOSIS — M47817 Spondylosis without myelopathy or radiculopathy, lumbosacral region: Secondary | ICD-10-CM

## 2017-11-28 ENCOUNTER — Ambulatory Visit
Admission: RE | Admit: 2017-11-28 | Discharge: 2017-11-28 | Disposition: A | Discharge status: Home / Self Care | Payer: Medicare Other | Source: Ambulatory Visit | Attending: Neurosurgery | Admitting: Neurosurgery

## 2017-11-28 DIAGNOSIS — M47817 Spondylosis without myelopathy or radiculopathy, lumbosacral region: Secondary | ICD-10-CM

## 2017-12-05 LAB — TSH: TSH: 1.35 (ref <=5.90)

## 2018-01-08 ENCOUNTER — Ambulatory Visit: Payer: Medicare Other | Admitting: Endocrinology

## 2018-01-08 ENCOUNTER — Encounter: Payer: Self-pay | Admitting: Endocrinology

## 2018-01-08 VITALS — BP 140/78 | HR 72 | Ht 68.0 in | Wt 242.0 lb

## 2018-01-08 DIAGNOSIS — E042 Nontoxic multinodular goiter: Secondary | ICD-10-CM

## 2018-01-08 NOTE — Progress Notes (Signed)
Subjective:    Patient ID: Miguel Miller, male    DOB: 1941/09/01, 77 y.o.   MRN: [id number]  HPI Pt returns for f/u of multinodular goiter (dx'ed 2017, incidentally on CT; he reported diaphoresis--urine catechols and calcitonin were normal; bx of dominant LLP nodule was Beth cat 2; f/u US is 2018 showed enlargement).  pt states he feels well in general.  He does not notice the goiter.   Past Medical History:  Diagnosis Date  . Actinic keratoses   . Arteriosclerosis   . BPH (benign prostatic hyperplasia)   . Diabetes mellitus type 2, controlled, without complications ([place])    diet controlled  . Diverticulosis   . DJD (degenerative joint disease)   . Dyslipidemia   . Eczema   . ED (erectile dysfunction)   . Essential tremor   . GERD (gastroesophageal reflux disease)   . Glaucoma    h/o retineal tear; pt denies glaucoma(09/29/16)  . Hypertension   . Low testosterone   . Peripheral vascular disease ([place])    ? PAD  . Thrombocytopenia ([place])   . Thyroid goiter     Past Surgical History:  Procedure Laterality Date  . BACK SURGERY     LUMBAR  2-3   03/2011  . BIOPSY THYROID    . c-spine  1999   Dr. Annette Stable  . CATARACT EXTRACTION  1997  . CATARACT EXTRACTION     LEFT   1998  . fusion L2  2012   Dr. Annette Stable  . HEMORRHOIDECTOMY WITH HEMORRHOID BANDING    . KNEE SURGERY Bilateral 2005   Dr. Berenice Primas  . l-spine  2003   Dr. Annette Stable  . laser surgery for holes in retina Right 08/2012  . outpatient right shoulder  2008   Dr. Percell Miller  . PROSTATE SURGERY     09/2016  LASER  . PYLOROMYOTOMY     1944  . RETINAL DETACHMENT SURGERY  1996   Dr. Glennon Mac  . retineal tear  11/2012   Dr. Darrick Grinder  . TOTAL HIP ARTHROPLASTY Left 06/05/2017  . TOTAL HIP ARTHROPLASTY Left 06/05/2017   Procedure: TOTAL HIP ARTHROPLASTY ANTERIOR APPROACH;  Surgeon: Renette Butters, MD;  Location: [facility];  Service: Orthopedics;  Laterality: Left;  [name] VASECTOMY      Social History   Socioeconomic History  .  Marital status: Married    Spouse name: Jeani Hawking  . Number of children: 2  . Years of education: 16+  . Highest education level: Not on file  Occupational History  . Occupation: retired Tour manager  . Financial resource strain: Not on file  . Food insecurity:    Worry: Not on file    Inability: Not on file  . Transportation needs:    Medical: Not on file    Non-medical: Not on file  Tobacco Use  . Smoking status: Never Smoker  . Smokeless tobacco: Never Used  Substance and Sexual Activity  . Alcohol use: Yes    Comment: rare  . Drug use: No  . Sexual activity: Not on file  Lifestyle  . Physical activity:    Days per week: Not on file    Minutes per session: Not on file  . Stress: Not on file  Relationships  . Social connections:    Talks on phone: Not on file    Gets together: Not on file    Attends religious service: Not on file    Active member of club or organization: Not on  file    Attends meetings of clubs or organizations: Not on file    Relationship status: Not on file  . Intimate partner violence:    Fear of current or ex partner: Not on file    Emotionally abused: Not on file    Physically abused: Not on file    Forced sexual activity: Not on file  Other Topics Concern  . Not on file  Social History Narrative   Patient lives with his wife.   Patient has 2 children.   Patient is retired.   Patient drinks 1 glass of caffeine daily.   Patient has a college education.    Current Outpatient Medications on File Prior to Visit  Medication Sig Dispense Refill  . acetaminophen (TYLENOL) 500 MG tablet Take 1,000 mg by mouth every 6 (six) hours as needed for moderate pain or headache.    . ANDROGEL PUMP 20.25 MG/ACT (1.62%) GEL Apply 2 application topically daily.     [name] aspirin EC 325 MG tablet Take 1 tablet (325 mg total) by mouth daily. For 30 days post op for DVT Prophylaxis 30 tablet 0  . diazepam (VALIUM) 5 MG tablet Take 1 tablet (5 mg total) by mouth  every 8 (eight) hours as needed for muscle spasms. Do not take with other muscle spasm medicine or pain medication. 30 tablet 0  . docusate sodium (COLACE) 100 MG capsule Take 1 capsule (100 mg total) by mouth 2 (two) times daily. To prevent constipation while taking pain medication. 60 capsule 0  . ezetimibe (ZETIA) 10 MG tablet Take 10 mg by mouth every evening.     [name] lisinopril (PRINIVIL,ZESTRIL) 20 MG tablet Take 20 mg by mouth every evening.     . NON FORMULARY Inject 0.5 mLs as directed as needed (ED).    [name] omeprazole (PRILOSEC) 20 MG capsule Take 1 capsule (20 mg total) by mouth daily. 30 days for gastroprotection while taking Aspirin. 30 capsule 0  . propranolol (INDERAL) 10 MG tablet Take 10 mg by mouth daily as needed (tremors).     . zolpidem (AMBIEN) 10 MG tablet Take 10 mg by mouth at bedtime as needed for sleep.     No current facility-administered medications on file prior to visit.     Allergies  Allergen Reactions  . Tramadol Other (See Comments)    Hallucinations -He thinks-just remembered had a problem  . Hydrocodone Bitartrate [Hydrocodone] Itching and Other (See Comments)  . Oxycodone Hives  . Statins Other (See Comments)    Muscle aches   . Welchol [Colesevelam Hcl] Other (See Comments)    constipation    Family History  Problem Relation Age of Onset  . Emphysema Father   . Arthritis Father   . Diabetes Brother   . High blood pressure Brother   . Heart disease Paternal Grandfather   . Thyroid nodules Neg Hx     BP 140/78 (BP Location: Left Arm, Patient Position: Sitting, Cuff Size: Large)   Pulse 72   Ht 5\' 8"  (1.727 m)   Wt 242 lb (109.8 kg)   SpO2 98%   BMI 36.80 kg/m    Review of Systems He has some difficulty swallowing pills.      Objective:   Physical Exam VS: see vs page.  GEN: no distress.   NECK: a healed scar is present (C-spine procedure); there is no palpable thyroid enlargement.  No thyroid nodule is palpable.  No palpable  lymphadenopathy at the anterior neck.  Lab Results  Component Value Date   TSH 1.35 12/05/2017      Assessment & Plan:  Multinodular goiter: due for recheck. Dysphagia: mild.  eval if persists.    Patient Instructions  Let's recheck the ultrasound.  you will receive a phone call, about a day and time for an appointment If it hasn't changed much, please come back for a follow-up appointment in 1 year.   most of the time, a "lumpy thyroid" will eventually become overactive.  this is usually a slow process, happening over the span of many years.

## 2018-01-08 NOTE — Patient Instructions (Addendum)
Let's recheck the ultrasound.  you will receive a phone call, about a day and time for an appointment If it hasn't changed much, please come back for a follow-up appointment in 1 year.   most of the time, a "lumpy thyroid" will eventually become overactive.  this is usually a slow process, happening over the span of many years.

## 2018-01-18 ENCOUNTER — Ambulatory Visit
Admission: RE | Admit: 2018-01-18 | Discharge: 2018-01-18 | Disposition: A | Discharge status: Home / Self Care | Payer: Medicare Other | Source: Ambulatory Visit | Attending: Endocrinology | Admitting: Endocrinology

## 2018-01-18 DIAGNOSIS — E042 Nontoxic multinodular goiter: Secondary | ICD-10-CM

## 2018-03-25 ENCOUNTER — Ambulatory Visit: Payer: Medicare Other | Admitting: Podiatry

## 2018-06-21 ENCOUNTER — Other Ambulatory Visit: Payer: Self-pay | Admitting: Neurosurgery

## 2018-06-21 DIAGNOSIS — M47817 Spondylosis without myelopathy or radiculopathy, lumbosacral region: Secondary | ICD-10-CM

## 2018-06-28 ENCOUNTER — Ambulatory Visit
Admission: RE | Admit: 2018-06-28 | Discharge: 2018-06-28 | Disposition: A | Discharge status: Home / Self Care | Payer: Medicare Other | Source: Ambulatory Visit | Attending: Neurosurgery | Admitting: Neurosurgery

## 2018-06-28 DIAGNOSIS — M47817 Spondylosis without myelopathy or radiculopathy, lumbosacral region: Secondary | ICD-10-CM

## 2018-07-23 ENCOUNTER — Encounter: Payer: Self-pay | Admitting: Physical Medicine and Rehabilitation

## 2018-07-31 ENCOUNTER — Telehealth: Payer: Self-pay | Admitting: General Practice

## 2018-07-31 NOTE — Telephone Encounter (Signed)
[place] CSW Progress Note  Call to patient, would sleep disturbances (hard to get to sleep and interrupted sleep), low mood, anhedonia, stress).  Discussed caregiver stress and coping skills.  Wife has new diagnosis and will be starting new chemotherapy regimen.  Pt has had recent back surgery; has his own health and pain issues.  Concerned about seeing himself becoming more stressed, wants to be proactive and develop plan to manage multiple stressors.  Discussed options of counseling intern at [facility], referral to outside provider and/or scheduling time w CSW for caregiver support. Pt would like male counselor in network w his insurance - CSW sent options of Mat Sandifer LCSW ([place] [phone]) or Apolonio Schneiders PhD ([place] [phone]).  Scheduled time for patient to meet w CSW on Dec 5 at 10:30 AM in [place] for more specific issues related to caregiver support.  Edwyna Shell, LCSW Clinical Social Worker Phone:  [phone]

## 2018-08-08 ENCOUNTER — Encounter: Payer: Self-pay | Admitting: General Practice

## 2018-08-08 NOTE — Progress Notes (Signed)
[facility] CSW Progress Notes  Met w patient for caregiver support.  Is providing significant care for spouse diagnosed w cancer.  Discussed various ways to deal w stress including physical and cognitive approaches.  Provided resources for several mindfulness techniques to address needs for stress reduction.  CSW available to meet w him as needed, patient will contact to schedule appointment.    Anne Cunningham, LCSW Clinical Social Worker Phone:  [phone]  

## 2018-10-31 ENCOUNTER — Ambulatory Visit (INDEPENDENT_AMBULATORY_CARE_PROVIDER_SITE_OTHER): Payer: Self-pay | Admitting: Physical Medicine and Rehabilitation

## 2018-11-07 ENCOUNTER — Ambulatory Visit (INDEPENDENT_AMBULATORY_CARE_PROVIDER_SITE_OTHER): Payer: Medicare Other | Admitting: Physical Medicine and Rehabilitation

## 2018-11-07 ENCOUNTER — Encounter (INDEPENDENT_AMBULATORY_CARE_PROVIDER_SITE_OTHER): Payer: Self-pay | Admitting: Physical Medicine and Rehabilitation

## 2018-11-07 VITALS — BP 153/78 | HR 67 | Ht 68.0 in | Wt 245.0 lb

## 2018-11-07 DIAGNOSIS — M25552 Pain in left hip: Secondary | ICD-10-CM

## 2018-11-07 DIAGNOSIS — M25551 Pain in right hip: Secondary | ICD-10-CM

## 2018-11-07 DIAGNOSIS — G894 Chronic pain syndrome: Secondary | ICD-10-CM | POA: Diagnosis not present

## 2018-11-07 DIAGNOSIS — M961 Postlaminectomy syndrome, not elsewhere classified: Secondary | ICD-10-CM | POA: Diagnosis not present

## 2018-11-07 DIAGNOSIS — M5416 Radiculopathy, lumbar region: Secondary | ICD-10-CM | POA: Diagnosis not present

## 2018-11-07 NOTE — Progress Notes (Signed)
 .  Numeric Pain Rating Scale and Functional Assessment Average Pain 6 Pain Right Now 1 My pain is constant, dull aching  Pain is worse with: walking Pain improves with: rest   In the last MONTH (on 0-10 scale) has pain interfered with the following?  1. General activity like being  able to carry out your everyday physical activities such as walking, climbing stairs, carrying groceries, or moving a chair?  Rating(5)  2. Relation with others like being able to carry out your usual social activities and roles such as  activities at home, at work and in your community. Rating(5)  3. Enjoyment of life such that you have  been bothered by emotional problems such as feeling anxious, depressed or irritable?  Rating(2)

## 2018-11-12 ENCOUNTER — Encounter (INDEPENDENT_AMBULATORY_CARE_PROVIDER_SITE_OTHER): Payer: Self-pay | Admitting: Physical Medicine and Rehabilitation

## 2018-11-12 NOTE — Progress Notes (Signed)
Miguel Miller - 78 y.o. male MRN [id number]  Date of birth: Jul 26, 1941  Office Visit Note: Visit Date: 11/07/2018 PCP: [name], MD Referred by: [name], MD  Subjective: Chief Complaint  Patient presents with  . Lower Back - Pain  . Right Hip - Pain  . Left Hip - Pain   HPI: Miguel Miller is a 78 y.o. male who comes in today As a self-referral and basically second opinion on chronic worsening recalcitrant low back pain bilateral buttock pain and hip pain.  He reports an extensive history of lumbar spine surgery.  He has had what appears to be 3 separate surgeries but ultimately has L2-L5 interbody fusion with pedicle screws.  This was performed by Dr. Earnie Larsson at [facility].  He reports some initial relief with the surgery but then continued pain more recently.  The pain he describes as lower back and buttocks bilaterally some referral to the lateral hips.  No groin pain.  He has had a left total hip arthroplasty and has had both knees replaced as well.  He is also had cervical spine fusion in the past.  He reports chronic worsening and gets a lot of symptoms of walking for a long period of time.  He has to this find a place to sit down.  It does limit what he can do.  He does get some relief with changing position.  His symptoms are a constant dull aching pain worse with walking and better at rest.  He does not do well with oxycodone or hydrocodone or tramadol.  He has had physical therapy in the past but not recently.  He has had injection treatment by Dr. Marlaine Hind.  These were performed at [facility] and have included radiofrequency ablation at L5-S1 with only temporary relief if any.  He has had sacroiliac joint injection again but not much relief.  CT scan done recently in the fall of this year showed severe arthritis above the fusion and below the fusion.  There was no high-level stenosis above or below the  fusion.  He has not had any new trauma or new symptoms.  He is not had any focal weakness or bowel or bladder changes or any neurogenic symptoms.  He explains that Dr. Annette Stable has stated to him there is really nothing surgical that can be done.  Review of Systems  Constitutional: Negative for chills, fever, malaise/fatigue and weight loss.  HENT: Negative for hearing loss and sinus pain.   Eyes: Negative for blurred vision, double vision and photophobia.  Respiratory: Negative for cough and shortness of breath.   Cardiovascular: Negative for chest pain, palpitations and leg swelling.  Gastrointestinal: Negative for abdominal pain, nausea and vomiting.  Genitourinary: Negative for flank pain.  Musculoskeletal: Positive for back pain and joint pain. Negative for myalgias.  Skin: Negative for itching and rash.  Neurological: Negative for tremors, focal weakness and weakness.  Endo/Heme/Allergies: Negative.   Psychiatric/Behavioral: Negative for depression.  All other systems reviewed and are negative.  Otherwise per HPI.  Assessment & Plan: Visit Diagnoses:  1. Lumbar radiculopathy   2. Post laminectomy syndrome   3. Chronic pain syndrome   4. Pain in left hip   5. Pain in right hip     Plan: Findings:  Chronic constant persistent recalcitrant low back pain referring into the buttocks and lateral hips that limits his daily function and ability to walk any distance.  He has had  multiple back surgery with lumbar fusion L2-L5 with severe arthritis above and below the fusion but no stenosis.  His symptoms are consistent seemingly with facet arthropathy and mechanical changes with his spine and likely some level of discogenic type pain at L5-S1.  At this point he is not really considered a surgical candidate for anything left that can be done.  He has been followed extensively by Dr. Earnie Larsson.  He has had injection treatments with Dr. Brien Few including lumbar radiofrequency ablation at L5-S1 without  much success along with sacroiliac joints as well.  He has not had the injections performed above the fusion but that does not coincide with where his pain is.  L1-2 arthritis really would not hurt across the upper buttock region.  I think at this point we had a long discussion today we talked for about 45 minutes about his condition is to look at possible spinal cord stimulator trial.  He had attended a class with [employer] with Dr. Brien Few.  He had been told that he probably was not really a candidate by Dr. Annette Stable.  I am not sure if this is some misinterpretation between the patient the doctor but I do think he is a good candidate for a trial.  I am going to go ahead and set up neuropsychological testing for potential stimulator trial.  I did give him some more information and some contact information with [employer].    Meds & Orders: No orders of the defined types were placed in this encounter.   Orders Placed This Encounter  Procedures  . Ambulatory referral to Neuropsychology    Follow-up: Return for Referral to Dr. Sima Matas for neuropsychiatric evaluation pre-spinal cord stimulator trial.   Procedures: No procedures performed  No notes on file   Clinical History: CT Scan Lumbar Disc levels:  Posterior lumbar interbody fusion from L2 through L5 with solid arthrodesis across the disc spaces and posterior elements. No hardware failure or complication. Anterior bridging osteophytes at T12-L1, L1-2, L2-3, L4-5 and L5-S1.  T12-L1: Left paracentral disc osteophyte complex. Mild right foraminal stenosis. No left foraminal stenosis. Mild bilateral facet arthropathy.  L1-L2: Broad-based disc bulge. Severe bilateral facet arthropathy. Severe right foraminal stenosis. No left foraminal stenosis.  L2-L3: Interbody fusion.  No foraminal stenosis.  L3-L4: Interbody fusion.  No foraminal stenosis.  L4-L5: Interbody fusion with posterior osseous ridging. Mild  right foraminal stenosis. No left foraminal stenosis.  L5-S1: Broad-based disc osteophyte complex. Severe bilateral facet arthropathy. Mild left foraminal stenosis. No right foraminal stenosis.  IMPRESSION: 1. Posterior lumbar interbody fusion from L2 through L5 with solid arthrodesis across the disc spaces and posterior elements. No hardware failure or complication. 2. 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 3.  Aortic Atherosclerosis (ICD10-I70.0).   Electronically Signed   By: Kathreen Devoid   On: 06/28/2018 13:58  --------- MRI LUMBAR SPINE WITHOUT AND WITH CONTRAST  TECHNIQUE: Multiplanar and multiecho pulse sequences of the lumbar spine were obtained without and with intravenous contrast.  CONTRAST: 20 mL MultiHance IV  COMPARISON: MRI 05/06/2014  FINDINGS: Pedicle screw and interbody fusion L2-3, L3-4, and L4-5 similar to the prior study. Normal alignment. No fracture or mass lesion. Postcontrast imaging reveals typical enhancement related to prior surgery. No evidence of infection or tumor. Conus medullaris appears normal and terminates at L1-2.  L1-2: Mild disc bulging. Mild facet degeneration and mild spinal stenosis. No change from the prior study.  L2-3: Pedicle screw and interbody fusion with posterior  decompression on the right. Endplate spurring with mild foraminal narrowing bilaterally. 10 mm fluid collection posteriorly on the right in the surgical bed is slightly smaller consistent with postop fluid. No significant spinal stenosis  L3-4: Pedicle screw and interbody fusion with posterior decompression. Negative for spinal stenosis  L4-5: Pedicle screw and interbody fusion with posterior decompression. Negative for spinal stenosis  L5-S1: Mild disc degeneration. Left-sided facet hypertrophy causing mild left foraminal encroachment unchanged from the prior study.  IMPRESSION: Overall no change from the MRI of 05/06/2014.  Satisfactory pedicle screw and interbody fusion L2 through L5.  Mild adjacent segment degeneration at L1-2 is stable. This is causing mild spinal stenosis.  Left foraminal encroachment L5-S1 due to facet hypertrophy also unchanged.   Electronically Signed By: Franchot Gallo M.D. On: 10/27/2015 16:22   He reports that he has never smoked. He has never used smokeless tobacco. No results for input(s): HGBA1C, LABURIC in the last 8760 hours.  Objective:  VS:  HT:5\' 8"  (172.7 cm)   WT:245 lb (111.1 kg)  BMI:37.26    BP:(!) 153/78  HR:67bpm  TEMP: ( )  RESP:  Physical Exam Vitals signs and nursing note reviewed.  Constitutional:      General: He is not in acute distress.    Appearance: He is well-developed.  HENT:     Head: Normocephalic and atraumatic.     Nose: Nose normal.     Mouth/Throat:     Mouth: Mucous membranes are moist.     Pharynx: Oropharynx is clear.  Eyes:     Conjunctiva/sclera: Conjunctivae normal.     Pupils: Pupils are equal, round, and reactive to light.  Neck:     Musculoskeletal: Normal range of motion and neck supple.     Trachea: No tracheal deviation.  Cardiovascular:     Rate and Rhythm: Normal rate and regular rhythm.     Pulses: Normal pulses.  Pulmonary:     Effort: Pulmonary effort is normal.     Breath sounds: Normal breath sounds.  Abdominal:     General: There is no distension.     Palpations: Abdomen is soft.     Tenderness: There is no guarding or rebound.  Musculoskeletal:        General: No deformity.     Right lower leg: No edema.     Left lower leg: No edema.     Comments: Patient is somewhat slow to rise from a seated position with very stiff lumbar spine with some pain on extension that is concordant across the low back.  No real pain over the PSIS.  Really negative Fortin finger sign bilaterally.  Mild pain over the greater trochanters.  No pain with hip rotation.  He has good distal strength bilaterally and symmetric.  He has  no clonus bilaterally.  Skin:    General: Skin is warm and dry.     Findings: No erythema or rash.  Neurological:     General: No focal deficit present.     Mental Status: He is alert and oriented to person, place, and time.     Motor: No abnormal muscle tone.     Coordination: Coordination normal.     Gait: Gait normal.  Psychiatric:        Mood and Affect: Mood normal.        Behavior: Behavior normal.        Thought Content: Thought content normal.     Ortho Exam Imaging: No results found.  Past  Medical/Family/Surgical/Social History: Medications & Allergies reviewed per EMR, new medications updated. Patient Active Problem List   Diagnosis Date Noted  . Primary osteoarthritis of left hip 05/15/2017  . BPH (benign prostatic hyperplasia) 05/15/2017  . Low testosterone 05/15/2017  . Multinodular goiter 01/08/2017  . Erectile dysfunction 08/19/2016  . Moderate obesity 08/19/2016  . Diaphoresis 07/11/2016  . CAD (coronary artery disease) 07/03/2016  . CHF (congestive heart failure) ([place]) 07/03/2016  . Essential hypertension 07/03/2016  . Diabetes mellitus type 2 in obese ([place]) 07/03/2016  . Hypercholesterolemia 07/03/2016  . Essential tremor 07/02/2013   Past Medical History:  Diagnosis Date  . Actinic keratoses   . Arteriosclerosis   . BPH (benign prostatic hyperplasia)   . Diabetes mellitus type 2, controlled, without complications ([place])    diet controlled  . Diverticulosis   . DJD (degenerative joint disease)   . Dyslipidemia   . Eczema   . ED (erectile dysfunction)   . Essential tremor   . GERD (gastroesophageal reflux disease)   . Glaucoma    h/o retineal tear; pt denies glaucoma(09/29/16)  . Hypertension   . Low testosterone   . Peripheral vascular disease ([place])    ? PAD  . Thrombocytopenia ([place])   . Thyroid goiter    Family History  Problem Relation Age of Onset  . Emphysema Father   . Arthritis Father   . Diabetes Brother   . High blood pressure  Brother   . Heart disease Paternal Grandfather   . Thyroid nodules Neg Hx    Past Surgical History:  Procedure Laterality Date  . BACK SURGERY     LUMBAR  2-3   03/2011  . BACK SURGERY     Lami/microdiscectomy 02/13/18 - Dr. Earnie Larsson  . BIOPSY THYROID    . c-spine  1999   Dr. Annette Stable  . CATARACT EXTRACTION  1997  . CATARACT EXTRACTION     LEFT   1998  . fusion L2  2012   Dr. Annette Stable  . HEMORRHOIDECTOMY WITH HEMORRHOID BANDING    . KNEE SURGERY Bilateral 2005   Dr. Berenice Primas  . l-spine  2003   Dr. Annette Stable  . laser surgery for holes in retina Right 08/2012  . outpatient right shoulder  2008   Dr. Percell Miller  . PROSTATE SURGERY     09/2016  LASER  . PYLOROMYOTOMY     1944  . RETINAL DETACHMENT SURGERY  1996   Dr. Glennon Mac  . retineal tear  11/2012   Dr. Darrick Grinder  . SPINAL FUSION     Pedicle screw and interbody fusion L2-3, L3-4, and L4-5  . TOTAL HIP ARTHROPLASTY Left 06/05/2017  . TOTAL HIP ARTHROPLASTY Left 06/05/2017   Procedure: TOTAL HIP ARTHROPLASTY ANTERIOR APPROACH;  Surgeon: Renette Butters, MD;  Location: [facility];  Service: Orthopedics;  Laterality: Left;  [name] VASECTOMY     Social History   Occupational History  . Occupation: retired Pharmacist, hospital  Tobacco Use  . Smoking status: Never Smoker  . Smokeless tobacco: Never Used  Substance and Sexual Activity  . Alcohol use: Yes    Comment: rare  . Drug use: No  . Sexual activity: Not on file

## 2018-11-21 ENCOUNTER — Other Ambulatory Visit: Payer: Self-pay

## 2018-11-21 ENCOUNTER — Encounter: Payer: Self-pay | Admitting: Psychology

## 2018-11-21 ENCOUNTER — Encounter (HOSPITAL_BASED_OUTPATIENT_CLINIC_OR_DEPARTMENT_OTHER): Payer: Medicare Other | Admitting: Psychology

## 2018-11-21 ENCOUNTER — Encounter: Payer: Medicare Other | Attending: Psychology | Admitting: Psychology

## 2018-11-21 DIAGNOSIS — M5416 Radiculopathy, lumbar region: Secondary | ICD-10-CM | POA: Insufficient documentation

## 2018-11-21 DIAGNOSIS — G894 Chronic pain syndrome: Secondary | ICD-10-CM | POA: Insufficient documentation

## 2018-11-21 DIAGNOSIS — M961 Postlaminectomy syndrome, not elsewhere classified: Secondary | ICD-10-CM

## 2018-11-21 NOTE — Progress Notes (Signed)
Neuropsychological Consultation   Patient:   Miguel Miller   DOB:   June 15, 1941  MR Number:  [id number]  Location:  [facility] [place], [place] [id number] [place] [id number] Dept: [phone]           Date of Service:   11/21/2018  Start Time:   8 AM End Time:   9 AM  Provider/Observer:  Ilean Skill, Psy.D.       Clinical Neuropsychologist       Billing Code/Service: Diagnostic clinical interview  Chief Complaint:    Miguel Miller is a 78 year old male referred by Dr. Ernestina Patches for psychological evaluation as part of the standard protocol for the work-up for consideration of a spinal cord stimulator trialing and possible implantation.  The patient has been seen Dr. Trenton Gammon for neurosurgical interventions for many years now.  He has had 4 prior cervical and lumbar surgeries.  At this point, Dr. Milana Kidney feels he is done as much surgically as he can do to help with the patient's significant pain symptoms.  The patient was referred to see Dr. Ernestina Patches to look at what else could be done to help patient with his chronic pain symptoms.  Reason for Service:  Miguel Miller is a 78 year old male referred by Dr. Ernestina Patches for psychological evaluation as part of the standard protocol for the work-up for consideration of a spinal cord stimulator trialing and possible implantation.  The patient has been seen Dr. Trenton Gammon for neurosurgical interventions for many years now.  He has had 4 prior cervical and lumbar surgeries.  At this point, Dr. Milana Kidney feels he is done as much surgically as he can do to help with the patient's significant pain symptoms.  The patient was referred to see Dr. Ernestina Patches to look at what else could be done to help patient with his chronic pain symptoms.  The patient reports that he had his first cervical spine surgery in 2000 and had his first lumbar spine surgery in 2002 and also  had a lumbar surgery in 2012.  The patient is also had both knees having knee replacement surgeries as well as one hip replacement surgery.  The patient reports that he is trying to figure out ways for pain management for issues in his lower back and hip pain.  The patient does deal with significant arthritis as well as spinal stenosis.  When questioned about symptoms of depression or anxiety the patient denies any significant severe symptoms of depression or anxiety.  He does acknowledge a long-term stressor related to his wife being diagnosed with breast cancer several years ago and continuing in treatment.  She is now doing much better and there were no expected significant acute psychosocial stressors for this.  The patient does report that he sleeps well most nights but does have a difficult time sometimes going to sleep and usually wakes up 2 or 3 times each night.  The patient describes a good appetite and memory doing quite well but sometimes having difficulty when he gets stuck on a word.  Current Status:  The patient describes significant chronic pain in his back, legs and hips.  The patient is currently doing physical therapy and looking at a possible spinal cord stimulator.  Reliability of Information: The information is derived from 1 hour face-to-face clinical interview with the patient as well as review of available medical records.  Behavioral Observation: Miguel Miller  presents as a 78  y.o.-year-old Right Caucasian Male who appeared his stated age. his dress was Appropriate and he was Well Groomed and his manners were Appropriate to the situation.  his participation was indicative of Appropriate and Attentive behaviors.  There were any physical disabilities noted.  he displayed an appropriate level of cooperation and motivation.     Interactions:    Active Appropriate and Attentive  Attention:   within normal limits and attention span and concentration were age  appropriate  Memory:   within normal limits; recent and remote memory intact  Visuo-spatial:  not examined  Speech (Volume):  normal  Speech:   normal; normal  Thought Process:  Coherent and Relevant  Though Content:  WNL; not suicidal and not homicidal  Orientation:   person, place, time/date and situation  Judgment:   Good  Planning:   Good  Affect:    Appropriate  Mood:    Euthymic  Insight:   Good  Intelligence:   high  Marital Status/Living: The patient was born and raised in [place] and has 76 sibling.  The only major childhood illnesses were typical measles and chickenpox but there is also possibility that he had undiagnosed polio as a child.  The patient is married and continues to live with his wife of 77 years.  The patient has a 23 year old daughter who has depression and seizure disorder and a 62 year old daughter with no significant psychiatric or neurological symptoms.  Current Employment: The patient is retired.  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  Hobbies and interests include frequent nature education programs on birds, butterflies, native plants etc.  Substance Use:  No concerns of substance abuse are reported.    Education:   The patient has his masters degree from the [employer].  Medical History:   Past Medical History:  Diagnosis Date  . Actinic keratoses   . Arteriosclerosis   . BPH (benign prostatic hyperplasia)   . Diabetes mellitus type 2, controlled, without complications (Miguel Miller)    diet controlled  . Diverticulosis   . DJD (degenerative joint disease)   . Dyslipidemia   . Eczema   . ED (erectile dysfunction)   . Essential tremor   . GERD (gastroesophageal reflux disease)   . Glaucoma    h/o retineal tear; pt denies glaucoma(09/29/16)  . Hypertension   . Low testosterone   . Peripheral vascular disease ([place])    ? PAD  .  Thrombocytopenia ([place])   . Thyroid goiter         Abuse/Trauma History: There are no indications of history of abuse or trauma.  Psychiatric History:  The patient denies any significant history of depression or anxiety or other psychiatric illness.  Family Med/Psych History:  Family History  Problem Relation Age of Onset  . Emphysema Father   . Arthritis Father   . Diabetes Brother   . High blood pressure Brother   . Heart disease Paternal Grandfather   . Thyroid nodules Neg Hx     Risk of Suicide/Violence: virtually non-existent the patient denies any suicidal or homicidal ideation.  Impression/DX:  Miguel Miller is a 78 year old male referred by Dr. Ernestina Patches for psychological evaluation as part of the standard protocol for the work-up for consideration of a spinal cord stimulator trialing and possible implantation.  The patient has been seen Dr. Trenton Gammon for neurosurgical interventions for many years now.  He has had 4 prior cervical and  lumbar surgeries.  At this point, Dr. Milana Kidney feels he is done as much surgically as he can do to help with the patient's significant pain symptoms.  The patient was referred to see Dr. Ernestina Patches to look at what else could be done to help patient with his chronic pain symptoms.  The patient reports that he had his first cervical spine surgery in 2000 and had his first lumbar spine surgery in 2002 and also had a lumbar surgery in 2012.  The patient is also had both knees having knee replacement surgeries as well as one hip replacement surgery.  The patient reports that he is trying to figure out ways for pain management for issues in his lower back and hip pain.  The patient does deal with significant arthritis as well as spinal stenosis.  When questioned about symptoms of depression or anxiety the patient denies any significant severe symptoms of depression or anxiety.  He does acknowledge a long-term stressor related to his wife being diagnosed with breast cancer  several years ago and continuing in treatment.  She is now doing much better and there were no expected significant acute psychosocial stressors for this.  The patient does report that he sleeps well most nights but does have a difficult time sometimes going to sleep and usually wakes up 2 or 3 times each night.  The patient describes a good appetite and memory doing quite well but sometimes having difficulty when he gets stuck on a word.  Current Status:  The patient describes significant chronic pain in his back, legs and hips.  The patient is currently doing physical therapy and looking at a possible spinal cord stimulator.  Disposition/Plan:  The patient will complete the [place] multiphasic personality inventory-II as well as the pain patient profile (P3).  Once this objective psychological measures are completed there will be reviewed, interpreted and a formal report will be produced for Dr. Ernestina Patches.  Diagnosis:    Chronic pain syndrome  Lumbar radiculopathy  Post laminectomy syndrome         Electronically Signed   _______________________ Ilean Skill, Psy.D.

## 2018-11-21 NOTE — Progress Notes (Signed)
Psychological Evaluation    Patient:  Miguel Miller   DOB: 06-21-1941  MR Number: [id number]  Location: [facility] PAIN [facility] REH[facility]IVE MEDICINE [facility] PHYSICAL MEDICINE AND REHABILITATION [place], [place] [id number] [place] [id number] Dept: [phone]  Start: 2 PM End: 3 PM  Provider/Observer:     Edgardo Roys PsyD  Chief Complaint:      Chief Complaint  Patient presents with  . Pain    Reason For Service:     [name] is a 78 year old male referred by Dr. Ernestina Patches for psychological evaluation as part of the standard protocol for the work-up for consideration of a spinal cord stimulator trialing and possible implantation.  The patient has been seen Dr. Trenton Gammon for neurosurgical interventions for many years now.  He has had 4 prior cervical and lumbar surgeries.  At this point, Dr. Milana Kidney feels he is done as much surgically as he can do to help with the patient's significant pain symptoms.  The patient was referred to see Dr. Ernestina Patches to look at what else could be done to help patient with his chronic pain symptoms.  The patient reports that he had his first cervical spine surgery in 2000 and had his first lumbar spine surgery in 2002 and also had a lumbar surgery in 2012.  The patient is also had both knees having knee replacement surgeries as well as one hip replacement surgery.  The patient reports that he is trying to figure out ways for pain management for issues in his lower back and hip pain.  The patient does deal with significant arthritis as well as spinal stenosis.  When questioned about symptoms of depression or anxiety the patient denies any significant severe symptoms of depression or anxiety.  He does acknowledge a long-term stressor related to his wife being diagnosed with breast cancer several years ago and continuing in treatment.  She is now doing much better and there were no expected significant acute psychosocial  stressors for this.  The patient does report that he sleeps well most nights but does have a difficult time sometimes going to sleep and usually wakes up 2 or 3 times each night.  The patient describes a good appetite and memory doing quite well but sometimes having difficulty when he gets stuck on a word.  Testing Administered:  The patient completed the [place] multiphasic personality inventory as well as the pain patient profile.  Participation Level:   Active  Participation Quality:  Appropriate and Attentive      Behavioral Observation:  Well Groomed, Alert, and Appropriate.   Test Results:   Initially, the patient completed the [place] multiphasic personality inventory-II.  Resulting validity scales suggest that the patient approached this measure in an honest and straightforward fashion either attempting to exaggerate or minimize his current symptomatology.  The resulting basic/clinical scales show that the patient does have a very mild clinically significant elevation with regard to health concerns and physical complaints.  There were no clinically significant elevations for issues related to depression, anxiety, anger or maladjustment, or more significant psychiatric symptoms.  Further analysis utilizing content scales show that none of the content related clinical scales were significantly elevated.  There was a just under significant level of elevation with regard to health concerns.  Further analysis utilizing supplemental scales show that the patient scale related to potential vulnerability to alcohol or substance abuse was not clinically elevated and there were no indications of significant vulnerability towards substance abuse.  Neither of the patient's PTSD scales were elevated and there were no indications of excessive hostility, maladjustment, or social alienation.  Breaking down the primary clinical scales show that most of the depressive symptoms in this measure were  related to physical malfunction and there was no elevation with regard to subjective symptoms of depression, psychomotor retardation or fatigue or rumination/brooding symptoms.  Again, there were no indications of social or interpersonal deficits, no indications of lack of mastery over his thoughts or feelings, and no indications of paranoid ideation or other delusional types of symptoms.  The patient also completed the pain patient profile.  This measure helps adjusted some of the clinical scales that can become elevated on the MMPI-2 due to the lack of normative inclusion of chronic pain patients and their normative data set.  The P3 inventory has to normative samples in his comparison.  One is a community-based normative sample and the other is a pain patient population without significant psychiatric or psychological symptomatology.  The patient shows an elevation on somatic/physical health concerns relative to community-based non-pain sample but this is not elevated relative to a chronic pain patient population and therefore there are no indications of somatizations or somatoform disorders.  The patient's level of depression or anxiety on the scale were below both the normative/community-based sample as well as the pain patient population sample.  Summary of Results:   Overall, there were no indications of significant objective features of depression, anxiety, or more significant/disruptive psychological/psychiatric symptomatology.  The patient does not appear to have a somatoform or somatizations disorder and he also appears to have no significant history of PTSD, vulnerability to substance abuse, or difficulty with social adjustment or social adaptation.  Overall, there are no indications of significant psychological or psychiatric symptomatology that would be disruptive during the trialing phase.  Impression/Diagnosis:   Overall, the results of the current psychological evaluation do suggest that the  patient is an excellent candidate for spinal cord stimulator trialing and possible implantation with regard to psychological variables.  There were no indications of significant depression or anxiety or other more significant psychiatric symptoms and the patient has very good social skills, intellectual abilities, and ability to adapt and adjust to his environmental demands.  The patient does have 1 chronic psychosocial stressor but no acute psychosocial stressors that he is trying to cope with.  The patient has been diagnosed with breast cancer and is been going through treatment for some time now.  The patient's wife is improving but not out of the woods yet but she has had improvement seen on imaging techniques.  The patient is managing and coping with these issues with his wife and it does not appear to be a significant issue.  I did instruct the patient that if there was an acute exacerbation of situations with his wife that it would likely be poor time for the spinal cord stimulator trialing.  Diagnosis:    Axis I: Chronic pain syndrome  Lumbar radiculopathy  Post laminectomy syndrome   Ilean Skill, Psy.D. Neuropsychologist

## 2018-11-28 ENCOUNTER — Ambulatory Visit: Payer: Medicare Other | Admitting: Psychology

## 2018-12-03 ENCOUNTER — Encounter (INDEPENDENT_AMBULATORY_CARE_PROVIDER_SITE_OTHER): Payer: Self-pay | Admitting: Physical Medicine and Rehabilitation

## 2019-02-18 ENCOUNTER — Telehealth: Payer: Self-pay | Admitting: Psychology

## 2019-02-18 NOTE — Telephone Encounter (Signed)
Email to [employer] regarding disputed letter from [employer] re spinal cord stim review and report scanned to financial correspondance

## 2019-03-14 ENCOUNTER — Other Ambulatory Visit: Payer: Self-pay

## 2019-03-14 ENCOUNTER — Ambulatory Visit: Payer: Self-pay

## 2019-03-14 ENCOUNTER — Ambulatory Visit (INDEPENDENT_AMBULATORY_CARE_PROVIDER_SITE_OTHER): Payer: Medicare Other | Admitting: Physical Medicine and Rehabilitation

## 2019-03-14 ENCOUNTER — Encounter: Payer: Self-pay | Admitting: Physical Medicine and Rehabilitation

## 2019-03-14 VITALS — BP 161/80 | HR 68

## 2019-03-14 DIAGNOSIS — M5416 Radiculopathy, lumbar region: Secondary | ICD-10-CM | POA: Diagnosis not present

## 2019-03-14 DIAGNOSIS — G894 Chronic pain syndrome: Secondary | ICD-10-CM

## 2019-03-14 DIAGNOSIS — M961 Postlaminectomy syndrome, not elsewhere classified: Secondary | ICD-10-CM

## 2019-03-14 MED ORDER — CEPHALEXIN 500 MG PO CAPS: 500.0000 mg | ORAL_CAPSULE | Freq: Two times a day (BID) | ORAL | 0 refills | Status: DC

## 2019-03-14 MED ORDER — LIDOCAINE HCL (PF) 1 % IJ SOLN: 10.0000 mL | Freq: Once | INTRAMUSCULAR | Status: DC

## 2019-03-14 NOTE — Progress Notes (Signed)
  Numeric Pain Rating Scale and Functional Assessment Average Pain 5   In the last MONTH (on 0-10 scale) has pain interfered with the following?  1. General activity like being  able to carry out your everyday physical activities such as walking, climbing stairs, carrying groceries, or moving a chair?  Rating(8)   +Driver, -BT

## 2019-03-17 NOTE — Progress Notes (Signed)
Miguel Miller - 78 y.o. male MRN [id number]  Date of birth: 01-Nov-1940  Office Visit Note: Visit Date: 03/14/2019 PCP: [name], MD Referred by: [name], MD  Subjective: Chief Complaint  Patient presents with  . Lower Back - Pain   HPI: Miguel Miller is a 78 y.o. male who comes in today For planned spinal cord stimulator trial.  Patient was seen in March of this year and those notes can be fully reviewed.  He underwent pre-implant psychological evaluation by Dr. Sima Matas.  A lot of this course was delayed do to the coronavirus pandemic.  He has low back pain into the buttocks and hips and he has had prior multilevel lumbar fusion.  He is failed all manner of conservative and non-conservative care.  He has had multiple injections and medication trials.  He has unable to take opioids do to itching and intolerance.  The spinal cord stimulator trial represents really a almost last ditch effort for pain relief.  ROS Otherwise per HPI.  Assessment & Plan: Visit Diagnoses:  1. Lumbar radiculopathy   2. Post laminectomy syndrome   3. Chronic pain syndrome     Plan: No additional findings.   Meds & Orders:  Meds ordered this encounter  Medications  . lidocaine (PF) (XYLOCAINE) 1 % injection 10 mL  . cephALEXin (KEFLEX) 500 MG capsule    Sig: Take 1 capsule (500 mg total) by mouth 2 (two) times daily.    Dispense:  14 capsule    Refill:  0    Orders Placed This Encounter  Procedures  . Spinal Cord Stimulator - Placement  . Spinal Cord Stimulator - Analysis  . XR C-ARM NO REPORT    Follow-up: Return for Lead Pull.   Procedures: No procedures performed  Spinal Cord Stimulator Trial  Patient: Miguel Miller      Date of Birth: 12-02-1940 MRN: [id number] PCP: [name], MD      Visit Date: 03/14/2019   Universal Protocol:    Date/Time: 07/13/205:39 AM  Consent Given By: the patient  Position: PRONE  Additional Comments: Vital signs were monitored  before and after the procedure. Patient was prepped and draped in the usual sterile fashion. The correct patient, procedure, and site was verified.   Injection Procedure Details:  Procedure Site One Meds Administered:  Meds ordered this encounter  Medications  . lidocaine (PF) (XYLOCAINE) 1 % injection 10 mL  . cephALEXin (KEFLEX) 500 MG capsule    Sig: Take 1 capsule (500 mg total) by mouth 2 (two) times daily.    Dispense:  14 capsule    Refill:  0     Location/Site: Leads were placed just to the left and right of midline at the upper mostly at the top of the T8 vertebral body just at the superior endplate and then spanning 3 vertebral bodies with the inferior lead at the bottom of the T10 vertebral body.   Needle size: 14 gauge   Needle type: EPIMED RX Coud Epidural Needle  Needle Placement: T10-11 Dorsal epidural space  Findings:  -  -Comments: Excellent paresthesia coverage was obtained in all of the areas of the patient's normal pain.  Initial placement of the Epimed introducer was at T11-12 which according to pre-reviewed CT scan seem to show adequate canal space.  After loss of resistance patient got a quick paresthesia of the right leg that was fleeting.  We then introduced the stimulator lead and under direct fluoroscopic visualization once we push the  lead just through the end of the introducer the patient had similar quick paresthesia which was fleeting.  At that time we chose to remove the introducer and try this at one level cranially at T11-12 and had no issues at all.  Procedure Details: After localization of the T10-11 epidural space and the overlying soft tissue, the needle was introduced two bodies below this level to produce an adequate angle for epidural penetration. The soft tissue was infiltrated with 2-3 mls. of 1% Lidocaine without Epinephrine. Using the posterolateral approach, the #14 gauge Epimed needle was inserted down to the posterior aspect of the T11  lamina and then "walked off" the superior aspect of this lamina into the T10-11 epidural space. The epidural space was localized with loss of resistance and negative aspirate for CSF or blood. The [employer] (16) electrode stimulation lead was passed up so that just the superior most lead was at the location noted above while maintaining the lead in the midline.   The above procedure was repeated for a second [place] (16) electrode stimulation lead for the opposite side. The pulse generator system was adjusted and programmed by myself and the company technical representative by varying the stimulator sites, rates, pulse amplitude, and duration. Adequate coverage was obtained as described above.  Subsequent to this, the introducer needle was removed and the spinal cord stimulator trial lead was fastened to the skin using steri-strips and strain reduction loop. The lead wire was then connected and Tegaderm was applied to produce an occlusive dressing. The patient was then brought out of the procedure room and was given a portable stimulator.  The patient will follow-up in three to seven days to determine whether this was efficacious.  Oral prophylactic antibiotic Keflex , was prescribed as noted.   Additional Comments:  The patient tolerated the procedure well Dressing: As noted above    Post-procedure details: Patient was observed during the procedure. Post-procedure instructions were reviewed.  Patient left the clinic in stable condition.    Complex spinal cord stimulator programming and analysis.  Sufficient time over an hour was allotted and used to analyze and program the spinal cord stimulator by varying frequencies, leads, programs and automated programming.  This was performed mainly by the [employer] but aided and witnessed by myself.  Documentation of programs kept with [facility].   Clinical  History: CT Scan Lumbar Disc levels:  Posterior lumbar interbody fusion from L2 through L5 with solid arthrodesis across the disc spaces and posterior elements. No hardware failure or complication. Anterior bridging osteophytes at T12-L1, L1-2, L2-3, L4-5 and L5-S1.  T12-L1: Left paracentral disc osteophyte complex. Mild right foraminal stenosis. No left foraminal stenosis. Mild bilateral facet arthropathy.  L1-L2: Broad-based disc bulge. Severe bilateral facet arthropathy. Severe right foraminal stenosis. No left foraminal stenosis.  L2-L3: Interbody fusion.  No foraminal stenosis.  L3-L4: Interbody fusion.  No foraminal stenosis.  L4-L5: Interbody fusion with posterior osseous ridging. Mild right foraminal stenosis. No left foraminal stenosis.  L5-S1: Broad-based disc osteophyte complex. Severe bilateral facet arthropathy. Mild left foraminal stenosis. No right foraminal stenosis.  IMPRESSION: 1. Posterior lumbar interbody fusion from L2 through L5 with solid arthrodesis across the disc spaces and posterior elements. No hardware failure or complication. 2. 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 3.  Aortic Atherosclerosis (ICD10-I70.0).   Electronically Signed   By: Kathreen Devoid   On: 06/28/2018 13:58  --------- MRI LUMBAR SPINE WITHOUT AND WITH  CONTRAST  TECHNIQUE: Multiplanar and multiecho pulse sequences of the lumbar spine were obtained without and with intravenous contrast.  CONTRAST: 20 mL MultiHance IV  COMPARISON: MRI 05/06/2014  FINDINGS: Pedicle screw and interbody fusion L2-3, L3-4, and L4-5 similar to the prior study. Normal alignment. No fracture or mass lesion. Postcontrast imaging reveals typical enhancement related to prior surgery. No evidence of infection or tumor. Conus medullaris appears normal and terminates at L1-2.  L1-2: Mild disc bulging. Mild facet degeneration and mild spinal stenosis. No change from the  prior study.  L2-3: Pedicle screw and interbody fusion with posterior decompression on the right. Endplate spurring with mild foraminal narrowing bilaterally. 10 mm fluid collection posteriorly on the right in the surgical bed is slightly smaller consistent with postop fluid. No significant spinal stenosis  L3-4: Pedicle screw and interbody fusion with posterior decompression. Negative for spinal stenosis  L4-5: Pedicle screw and interbody fusion with posterior decompression. Negative for spinal stenosis  L5-S1: Mild disc degeneration. Left-sided facet hypertrophy causing mild left foraminal encroachment unchanged from the prior study.  IMPRESSION: Overall no change from the MRI of 05/06/2014. Satisfactory pedicle screw and interbody fusion L2 through L5.  Mild adjacent segment degeneration at L1-2 is stable. This is causing mild spinal stenosis.  Left foraminal encroachment L5-S1 due to facet hypertrophy also unchanged.   Electronically Signed By: Franchot Gallo M.D. On: 10/27/2015 16:22   He reports that he has never smoked. He has never used smokeless tobacco. No results for input(s): HGBA1C, LABURIC in the last 8760 hours.  Objective:  VS:  HT:    WT:   BMI:     BP:(!) 161/80  HR:68bpm  TEMP: ( )  RESP:  Physical Exam Musculoskeletal:     Comments: Patient ambulates without aid.  Patient has no pain over the greater trochanters and no pain with hip rotation.  There is good distal strength symmetrically.  There is no clonus bilaterally.  Neurological:     General: No focal deficit present.     Mental Status: He is alert and oriented to person, place, and time.     Motor: No abnormal muscle tone.     Coordination: Coordination normal.     Gait: Gait normal.  Psychiatric:        Mood and Affect: Mood normal.        Behavior: Behavior normal.        Thought Content: Thought content normal.        Judgment: Judgment normal.     Ortho Exam Imaging: No results  found.  Past Medical/Family/Surgical/Social History: Medications & Allergies reviewed per EMR, new medications updated. Patient Active Problem List   Diagnosis Date Noted  . Primary osteoarthritis of left hip 05/15/2017  . BPH (benign prostatic hyperplasia) 05/15/2017  . Low testosterone 05/15/2017  . Multinodular goiter 01/08/2017  . Erectile dysfunction 08/19/2016  . Moderate obesity 08/19/2016  . Diaphoresis 07/11/2016  . CAD (coronary artery disease) 07/03/2016  . CHF (congestive heart failure) ([place]) 07/03/2016  . Essential hypertension 07/03/2016  . Diabetes mellitus type 2 in obese ([place]) 07/03/2016  . Hypercholesterolemia 07/03/2016  . Essential tremor 07/02/2013   Past Medical History:  Diagnosis Date  . Actinic keratoses   . Arteriosclerosis   . BPH (benign prostatic hyperplasia)   . Diabetes mellitus type 2, controlled, without complications ([place])    diet controlled  . Diverticulosis   . DJD (degenerative joint disease)   . Dyslipidemia   . Eczema   .  ED (erectile dysfunction)   . Essential tremor   . GERD (gastroesophageal reflux disease)   . Glaucoma    h/o retineal tear; pt denies glaucoma(09/29/16)  . Hypertension   . Low testosterone   . Peripheral vascular disease ([place])    ? PAD  . Thrombocytopenia ([place])   . Thyroid goiter    Family History  Problem Relation Age of Onset  . Emphysema Father   . Arthritis Father   . Diabetes Brother   . High blood pressure Brother   . Heart disease Paternal Grandfather   . Thyroid nodules Neg Hx    Past Surgical History:  Procedure Laterality Date  . BACK SURGERY     LUMBAR  2-3   03/2011  . BACK SURGERY     Lami/microdiscectomy 02/13/18 - Dr. Earnie Larsson  . BIOPSY THYROID    . c-spine  1999   Dr. Annette Stable  . CATARACT EXTRACTION  1997  . CATARACT EXTRACTION     LEFT   1998  . fusion L2  2012   Dr. Annette Stable  . HEMORRHOIDECTOMY WITH HEMORRHOID BANDING    . KNEE SURGERY Bilateral 2005   Dr. Berenice Primas  . l-spine  2003    Dr. Annette Stable  . laser surgery for holes in retina Right 08/2012  . outpatient right shoulder  2008   Dr. Percell Miller  . PROSTATE SURGERY     09/2016  LASER  . PYLOROMYOTOMY     1944  . RETINAL DETACHMENT SURGERY  1996   Dr. Glennon Mac  . retineal tear  11/2012   Dr. Darrick Grinder  . SPINAL FUSION     Pedicle screw and interbody fusion L2-3, L3-4, and L4-5  . TOTAL HIP ARTHROPLASTY Left 06/05/2017  . TOTAL HIP ARTHROPLASTY Left 06/05/2017   Procedure: TOTAL HIP ARTHROPLASTY ANTERIOR APPROACH;  Surgeon: Renette Butters, MD;  Location: [place];  Service: Orthopedics;  Laterality: Left;  [name] VASECTOMY     Social History   Occupational History  . Occupation: retired Pharmacist, hospital  Tobacco Use  . Smoking status: Never Smoker  . Smokeless tobacco: Never Used  Substance and Sexual Activity  . Alcohol use: Yes    Comment: rare  . Drug use: No  . Sexual activity: Not on file

## 2019-03-17 NOTE — Procedures (Signed)
Complex spinal cord stimulator programming and analysis.  Sufficient time over an hour was allotted and used to analyze and program the spinal cord stimulator by varying frequencies, leads, programs and automated programming.  This was performed mainly by the [employer] but aided and witnessed by myself.  Documentation of programs kept with [facility].

## 2019-03-17 NOTE — Procedures (Signed)
Spinal Cord Stimulator Trial  Patient: Miguel Miller      Date of Birth: 1940-11-27 MRN: [id number] PCP: [name], MD      Visit Date: 03/14/2019   Universal Protocol:    Date/Time: 07/13/205:39 AM  Consent Given By: the patient  Position: PRONE  Additional Comments: Vital signs were monitored before and after the procedure. Patient was prepped and draped in the usual sterile fashion. The correct patient, procedure, and site was verified.   Injection Procedure Details:  Procedure Site One Meds Administered:  Meds ordered this encounter  Medications  . lidocaine (PF) (XYLOCAINE) 1 % injection 10 mL  . cephALEXin (KEFLEX) 500 MG capsule    Sig: Take 1 capsule (500 mg total) by mouth 2 (two) times daily.    Dispense:  14 capsule    Refill:  0     Location/Site: Leads were placed just to the left and right of midline at the upper mostly at the top of the T8 vertebral body just at the superior endplate and then spanning 3 vertebral bodies with the inferior lead at the bottom of the T10 vertebral body.   Needle size: 14 gauge   Needle type: EPIMED RX Coud Epidural Needle  Needle Placement: T10-11 Dorsal epidural space  Findings:  -  -Comments: Excellent paresthesia coverage was obtained in all of the areas of the patient's normal pain.  Initial placement of the Epimed introducer was at T11-12 which according to pre-reviewed CT scan seem to show adequate canal space.  After loss of resistance patient got a quick paresthesia of the right leg that was fleeting.  We then introduced the stimulator lead and under direct fluoroscopic visualization once we push the lead just through the end of the introducer the patient had similar quick paresthesia which was fleeting.  At that time we chose to remove the introducer and try this at one level cranially at T11-12 and had no issues at all.  Procedure Details: After localization of the T10-11 epidural space and the overlying soft  tissue, the needle was introduced two bodies below this level to produce an adequate angle for epidural penetration. The soft tissue was infiltrated with 2-3 mls. of 1% Lidocaine without Epinephrine. Using the posterolateral approach, the #14 gauge Epimed needle was inserted down to the posterior aspect of the T11 lamina and then "walked off" the superior aspect of this lamina into the T10-11 epidural space. The epidural space was localized with loss of resistance and negative aspirate for CSF or blood. The [employer] (16) electrode stimulation lead was passed up so that just the superior most lead was at the location noted above while maintaining the lead in the midline.   The above procedure was repeated for a second [place] (16) electrode stimulation lead for the opposite side. The pulse generator system was adjusted and programmed by myself and the company technical representative by varying the stimulator sites, rates, pulse amplitude, and duration. Adequate coverage was obtained as described above.  Subsequent to this, the introducer needle was removed and the spinal cord stimulator trial lead was fastened to the skin using steri-strips and strain reduction loop. The lead wire was then connected and Tegaderm was applied to produce an occlusive dressing. The patient was then brought out of the procedure room and was given a portable stimulator.  The patient will follow-up in three to seven days to determine whether this was efficacious.  Oral prophylactic antibiotic Keflex , was prescribed as  noted.   Additional Comments:  The patient tolerated the procedure well Dressing: As noted above    Post-procedure details: Patient was observed during the procedure. Post-procedure instructions were reviewed.  Patient left the clinic in stable condition.

## 2019-03-18 ENCOUNTER — Ambulatory Visit (INDEPENDENT_AMBULATORY_CARE_PROVIDER_SITE_OTHER): Payer: Medicare Other | Admitting: Physical Medicine and Rehabilitation

## 2019-03-18 ENCOUNTER — Other Ambulatory Visit: Payer: Self-pay

## 2019-03-18 DIAGNOSIS — M961 Postlaminectomy syndrome, not elsewhere classified: Secondary | ICD-10-CM

## 2019-03-18 DIAGNOSIS — G894 Chronic pain syndrome: Secondary | ICD-10-CM

## 2019-03-18 DIAGNOSIS — M5416 Radiculopathy, lumbar region: Secondary | ICD-10-CM

## 2019-03-18 NOTE — Progress Notes (Signed)
Patient came in today for reprogramming during his trial.  He also commented that he thought he needed to have his dressing changed.  It is not something we ordinarily do as the dressing does get some discharge the first day or so and then after that other than just looking through the Tegaderm is not really an issue.  However upon seeing him today the gauze pad was actually only halfway there and he said it actually came out of the Tegaderm and there were many of the Steri-Strips pulled up.  I have actually never seen this before and he felt like it was due to the amount of sweating that he was doing.  Nonetheless we did do a full dressing change and there were no swelling or induration or any other signs of infection around the lead.  He was reprogrammed by the [employer] today.  We will see him back for regular lead pull on Thursday.

## 2019-03-20 ENCOUNTER — Ambulatory Visit (INDEPENDENT_AMBULATORY_CARE_PROVIDER_SITE_OTHER): Payer: Medicare Other | Admitting: Physical Medicine and Rehabilitation

## 2019-03-20 ENCOUNTER — Other Ambulatory Visit: Payer: Self-pay

## 2019-03-20 ENCOUNTER — Encounter: Payer: Self-pay | Admitting: Physical Medicine and Rehabilitation

## 2019-03-20 VITALS — BP 159/91 | HR 65

## 2019-03-20 DIAGNOSIS — G894 Chronic pain syndrome: Secondary | ICD-10-CM

## 2019-03-20 DIAGNOSIS — M961 Postlaminectomy syndrome, not elsewhere classified: Secondary | ICD-10-CM

## 2019-03-20 DIAGNOSIS — M5416 Radiculopathy, lumbar region: Secondary | ICD-10-CM

## 2019-03-20 NOTE — Progress Notes (Signed)
Patient is here for spinal cord stimulator trial lead pull.  He reports having spent some time with good and bad days with the stimulator.  He reports 2 days of really 0 pain which seemed to be better but then has a lot of comments on pain with walking and hip pain.  He is unsure at this point if he really wants to continue with implanted stimulator.  We have suggested that he does not need to make the decision at this point that he can think about it.  We have suggested that sometimes after the stimulator is not present for a few days it is more evident that it was working.  He is very analytical person and this may be really a detriment to him overall.  For now we will hold off on making any decisions about placement.  Quick procedure note: Patient was placed prone on the exam table. The external stimulator/generator was unfastened from the leads. The outer Tegaderm was removed from the bandage carefully. There was no noted erythema of the skin or swelling or induration or drainage. There had been some bloody discharge in the gauze pad. Both trial leads were pulled without difficulty and without discomfort. There was no drainage from the insertion site. Band-Aid was applied.  .Numeric Pain Rating Scale and Functional Assessment Average Pain 5   In the last MONTH (on 0-10 scale) has pain interfered with the following?  1. General activity like being  able to carry out your everyday physical activities such as walking, climbing stairs, carrying groceries, or moving a chair?  Rating(4)

## 2019-04-08 ENCOUNTER — Ambulatory Visit (INDEPENDENT_AMBULATORY_CARE_PROVIDER_SITE_OTHER): Payer: Medicare Other | Admitting: Physical Medicine and Rehabilitation

## 2019-04-08 ENCOUNTER — Encounter: Payer: Self-pay | Admitting: Physical Medicine and Rehabilitation

## 2019-04-08 ENCOUNTER — Other Ambulatory Visit: Payer: Self-pay

## 2019-04-08 VITALS — BP 144/81 | HR 83

## 2019-04-08 DIAGNOSIS — G894 Chronic pain syndrome: Secondary | ICD-10-CM | POA: Diagnosis not present

## 2019-04-08 DIAGNOSIS — M5416 Radiculopathy, lumbar region: Secondary | ICD-10-CM

## 2019-04-08 DIAGNOSIS — M961 Postlaminectomy syndrome, not elsewhere classified: Secondary | ICD-10-CM | POA: Diagnosis not present

## 2019-04-08 MED ORDER — PREGABALIN 75 MG PO CAPS: ORAL_CAPSULE | ORAL | 2 refills | Status: DC

## 2019-04-08 NOTE — Progress Notes (Signed)
  Numeric Pain Rating Scale and Functional Assessment Average Pain 4   In the last MONTH (on 0-10 scale) has pain interfered with the following?  1. General activity like being  able to carry out your everyday physical activities such as walking, climbing stairs, carrying groceries, or moving a chair?  Rating(7)     

## 2019-05-26 ENCOUNTER — Encounter: Payer: Self-pay | Admitting: Physical Medicine and Rehabilitation

## 2019-05-26 NOTE — Progress Notes (Signed)
Miguel Miller - 78 y.o. male MRN [id number]  Date of birth: Mar 01, 1941  Office Visit Note: Visit Date: 04/08/2019 PCP: [name], MD Referred by: [name], MD  Subjective: Chief Complaint  Patient presents with   Lower Back - Pain   HPI: Miguel Miller is a 78 y.o. male who comes in today For evaluation management of chronic low back and hip pain.  Please see our prior notes for further details but the patient's had chronic pain syndrome and prior lumbar fusion with continued chronic low back pain and bilateral hip and thigh pain.  Patient had recent stimulator trial which he feels really just did not help enough to proceed with permanent implantation.  He does have a lot of issues which is really over analyzing what has helped and what is not helped.  He still talks about trying to find a source of his pain and I am not sure at this point that that is even something that is of valid assumption.  He has had work-up of his hips of his lower back he has had lumbar fusion he has had therapy he is still active he still walks.  He denies any tingling numbness or paresthesias or focal weakness.  He is somewhat obese at 245 pounds.  He has had imaging.  Medication wise he has not had much in the way of nerve membrane stabilizing type medication such as Lyrica or Cymbalta etc.  He has intolerances to most medications used for pain such as tramadol hydrocodone and oxycodone.  He almost has some level of central sensitization syndrome that is really not diagnosed which is probably with underlying anxiety and maybe fibromyalgia.  His biggest issue was he was told during the trial to expect a tingling sensation and he never really felt tingling and he has a hard time describing the sensation he was feeling during the trial.  I assured him that that was the feeling that people get.  The medical term is dysesthesia but it does not necessarily have to feel like a tingling and maybe has a poor choice of  words during the trial.  Review of Systems  Constitutional: Negative for chills, fever, malaise/fatigue and weight loss.  HENT: Negative for hearing loss and sinus pain.   Eyes: Negative for blurred vision, double vision and photophobia.  Respiratory: Negative for cough and shortness of breath.   Cardiovascular: Negative for chest pain, palpitations and leg swelling.  Gastrointestinal: Negative for abdominal pain, nausea and vomiting.  Genitourinary: Negative for flank pain.  Musculoskeletal: Positive for back pain and joint pain. Negative for myalgias.  Skin: Negative for itching and rash.  Neurological: Negative for tremors, focal weakness and weakness.  Endo/Heme/Allergies: Negative.   Psychiatric/Behavioral: Negative for depression.  All other systems reviewed and are negative.  Otherwise per HPI.  Assessment & Plan: Visit Diagnoses:  1. Lumbar radiculopathy   2. Post laminectomy syndrome   3. Chronic pain syndrome     Plan: Findings:  Chronic pain syndrome despite all manner of conservative and nonconservative care now status post lumbar fusion and unsuccessful spinal cord stimulator trial.  Patient seemed to have at least 1 day or so during the trial he was really pain-free and was able to do what he wanted to do but he just feels like it is not something he can trust to go forward with a placement of stimulator.  I really felt like that would probably be his best chance of getting some relief.  He  has no findings that would suggest any new surgeries.  We spoke for more than 30 minutes on his condition today.  Ruben trial Lyrica as a taper just to see if that helps at all and we talked about strengthening exercises and continued activity.  We will see him back and see how he does with the medication.  We talked about alternative therapies he does use a TENS unit we talked about acupuncture etc.    Meds & Orders:  Meds ordered this encounter  Medications   pregabalin (LYRICA) 75  MG capsule    Sig: 1 capsule at night for 7 nights, then 1 capsule 2 times a day    Dispense:  60 capsule    Refill:  2   No orders of the defined types were placed in this encounter.   Follow-up: No follow-ups on file.   Procedures: No procedures performed  No notes on file   Clinical History: CT Scan Lumbar Disc levels:  Posterior lumbar interbody fusion from L2 through L5 with solid arthrodesis across the disc spaces and posterior elements. No hardware failure or complication. Anterior bridging osteophytes at T12-L1, L1-2, L2-3, L4-5 and L5-S1.  T12-L1: Left paracentral disc osteophyte complex. Mild right foraminal stenosis. No left foraminal stenosis. Mild bilateral facet arthropathy.  L1-L2: Broad-based disc bulge. Severe bilateral facet arthropathy. Severe right foraminal stenosis. No left foraminal stenosis.  L2-L3: Interbody fusion.  No foraminal stenosis.  L3-L4: Interbody fusion.  No foraminal stenosis.  L4-L5: Interbody fusion with posterior osseous ridging. Mild right foraminal stenosis. No left foraminal stenosis.  L5-S1: Broad-based disc osteophyte complex. Severe bilateral facet arthropathy. Mild left foraminal stenosis. No right foraminal stenosis.  IMPRESSION: 1. Posterior lumbar interbody fusion from L2 through L5 with solid arthrodesis across the disc spaces and posterior elements. No hardware failure or complication. 2. 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 3.  Aortic Atherosclerosis (ICD10-I70.0).   Electronically Signed   By: Kathreen Devoid   On: 06/28/2018 13:58  --------- MRI LUMBAR SPINE WITHOUT AND WITH CONTRAST  TECHNIQUE: Multiplanar and multiecho pulse sequences of the lumbar spine were obtained without and with intravenous contrast.  CONTRAST: 20 mL MultiHance IV  COMPARISON: MRI 05/06/2014  FINDINGS: Pedicle screw and interbody fusion L2-3, L3-4, and L4-5 similar to the prior study. Normal alignment.  No fracture or mass lesion. Postcontrast imaging reveals typical enhancement related to prior surgery. No evidence of infection or tumor. Conus medullaris appears normal and terminates at L1-2.  L1-2: Mild disc bulging. Mild facet degeneration and mild spinal stenosis. No change from the prior study.  L2-3: Pedicle screw and interbody fusion with posterior decompression on the right. Endplate spurring with mild foraminal narrowing bilaterally. 10 mm fluid collection posteriorly on the right in the surgical bed is slightly smaller consistent with postop fluid. No significant spinal stenosis  L3-4: Pedicle screw and interbody fusion with posterior decompression. Negative for spinal stenosis  L4-5: Pedicle screw and interbody fusion with posterior decompression. Negative for spinal stenosis  L5-S1: Mild disc degeneration. Left-sided facet hypertrophy causing mild left foraminal encroachment unchanged from the prior study.  IMPRESSION: Overall no change from the MRI of 05/06/2014. Satisfactory pedicle screw and interbody fusion L2 through L5.  Mild adjacent segment degeneration at L1-2 is stable. This is causing mild spinal stenosis.  Left foraminal encroachment L5-S1 due to facet hypertrophy also unchanged.   Electronically Signed By: Franchot Gallo M.D. On: 10/27/2015 16:22   He reports that he has never smoked. He  has never used smokeless tobacco. No results for input(s): HGBA1C, LABURIC in the last 8760 hours.  Objective:  VS:  HT:     WT:    BMI:      BP:(!) 144/81   HR:83bpm   TEMP: ( )   RESP:  Physical Exam Vitals signs and nursing note reviewed.  Constitutional:      General: He is not in acute distress.    Appearance: He is well-developed. He is obese.  HENT:     Head: Normocephalic and atraumatic.     Nose: Nose normal.     Mouth/Throat:     Mouth: Mucous membranes are moist.     Pharynx: Oropharynx is clear.  Eyes:     Conjunctiva/sclera: Conjunctivae  normal.     Pupils: Pupils are equal, round, and reactive to light.  Neck:     Musculoskeletal: Normal range of motion and neck supple.     Trachea: No tracheal deviation.  Cardiovascular:     Rate and Rhythm: Normal rate and regular rhythm.     Pulses: Normal pulses.  Pulmonary:     Effort: Pulmonary effort is normal.     Breath sounds: Normal breath sounds.  Abdominal:     General: There is no distension.     Palpations: Abdomen is soft.     Tenderness: There is no guarding or rebound.  Musculoskeletal:        General: No deformity.     Right lower leg: No edema.     Left lower leg: No edema.     Comments: Patient is slow to rise from a seated position to full extension.  He has some pain with extension has a lot of stiffness of the lumbar spine.  No pain with hip rotation.  Some tenderness across the lower back and PSIS but no focal trigger points.  He has good distal strength without clonus.  Skin:    General: Skin is warm and dry.     Findings: No erythema or rash.  Neurological:     General: No focal deficit present.     Mental Status: He is alert and oriented to person, place, and time.     Motor: No weakness or abnormal muscle tone.     Coordination: Coordination normal.     Gait: Gait abnormal.  Psychiatric:        Mood and Affect: Mood normal.        Behavior: Behavior normal.        Thought Content: Thought content normal.     Ortho Exam Imaging: No results found.  Past Medical/Family/Surgical/Social History: Medications & Allergies reviewed per EMR, new medications updated. Patient Active Problem List   Diagnosis Date Noted   Primary osteoarthritis of left hip 05/15/2017   BPH (benign prostatic hyperplasia) 05/15/2017   Low testosterone 05/15/2017   Multinodular goiter 01/08/2017   Erectile dysfunction 08/19/2016   Moderate obesity 08/19/2016   Diaphoresis 07/11/2016   CAD (coronary artery disease) 07/03/2016   CHF (congestive heart failure)  ([place]) 07/03/2016   Essential hypertension 07/03/2016   Diabetes mellitus type 2 in obese ([place]) 07/03/2016   Hypercholesterolemia 07/03/2016   Essential tremor 07/02/2013   Past Medical History:  Diagnosis Date   Actinic keratoses    Arteriosclerosis    BPH (benign prostatic hyperplasia)    Diabetes mellitus type 2, controlled, without complications (HCC)    diet controlled   Diverticulosis    DJD (degenerative joint disease)    Dyslipidemia  Eczema    ED (erectile dysfunction)    Essential tremor    GERD (gastroesophageal reflux disease)    Glaucoma    h/o retineal tear; pt denies glaucoma(09/29/16)   Hypertension    Low testosterone    Peripheral vascular disease ([place])    ? PAD   Thrombocytopenia (HCC)    Thyroid goiter    Family History  Problem Relation Age of Onset   Emphysema Father    Arthritis Father    Diabetes Brother    High blood pressure Brother    Heart disease Paternal Grandfather    Thyroid nodules Neg Hx    Past Surgical History:  Procedure Laterality Date   BACK SURGERY     LUMBAR  2-3   03/2011   BACK SURGERY     Lami/microdiscectomy 02/13/18 - Dr. Earnie Larsson   BIOPSY THYROID     c-spine  1999   Dr. Annette Stable   CATARACT EXTRACTION  1997   CATARACT EXTRACTION     LEFT   1998   fusion L2  2012   Dr. Annette Stable   HEMORRHOIDECTOMY WITH HEMORRHOID BANDING     KNEE SURGERY Bilateral 2005   Dr. Berenice Primas   l-spine  2003   Dr. Annette Stable   laser surgery for holes in retina Right 08/2012   outpatient right shoulder  2008   Dr. Percell Miller   PROSTATE SURGERY     09/2016  LASER   PYLOROMYOTOMY     1944   RETINAL DETACHMENT SURGERY  1996   Dr. Glennon Mac   retineal tear  11/2012   Dr. Darrick Grinder   SPINAL FUSION     Pedicle screw and interbody fusion L2-3, L3-4, and L4-5   TOTAL HIP ARTHROPLASTY Left 06/05/2017   TOTAL HIP ARTHROPLASTY Left 06/05/2017   Procedure: TOTAL HIP ARTHROPLASTY ANTERIOR APPROACH;  Surgeon: Renette Butters, MD;  Location: [facility];  Service: Orthopedics;  Laterality: Left;   VASECTOMY     Social History   Occupational History   Occupation: retired Pharmacist, hospital  Tobacco Use   Smoking status: Never Smoker   Smokeless tobacco: Never Used  Substance and Sexual Activity   Alcohol use: Yes    Comment: rare   Drug use: No   Sexual activity: Not on file

## 2019-10-07 ENCOUNTER — Other Ambulatory Visit: Payer: Self-pay | Admitting: Neurosurgery

## 2019-10-07 DIAGNOSIS — M5412 Radiculopathy, cervical region: Secondary | ICD-10-CM

## 2019-10-12 ENCOUNTER — Ambulatory Visit: Payer: Medicare PPO | Attending: Internal Medicine

## 2019-10-12 DIAGNOSIS — Z23 Encounter for immunization: Secondary | ICD-10-CM | POA: Insufficient documentation

## 2019-10-12 NOTE — Progress Notes (Signed)
   Covid-19 Vaccination Clinic  Name:  Miguel Miller    MRN: [id number] DOB: Jan 09, 1941  10/12/2019  [name] was observed post Covid-19 immunization for 15 minutes without incidence. He was provided with Vaccine Information Sheet and instruction to access the V-Safe system.   [name] was instructed to call 911 with any severe reactions post vaccine: [name] Difficulty breathing  . Swelling of your face and throat  . A fast heartbeat  . A bad rash all over your body  . Dizziness and weakness    Immunizations Administered    Name Date Dose VIS Date Route   Pfizer COVID-19 Vaccine 10/12/2019  5:15 PM 0.3 mL 08/15/2019 Intramuscular   Manufacturer: [place]   Lot: [id number]   [place]: [id number]

## 2019-10-21 ENCOUNTER — Encounter: Payer: Self-pay | Admitting: *Deleted

## 2019-10-29 ENCOUNTER — Ambulatory Visit: Payer: Medicare Other

## 2019-11-03 ENCOUNTER — Encounter: Payer: Self-pay | Admitting: Radiation Oncology

## 2019-11-03 DIAGNOSIS — E291 Testicular hypofunction: Secondary | ICD-10-CM | POA: Insufficient documentation

## 2019-11-03 DIAGNOSIS — E118 Type 2 diabetes mellitus with unspecified complications: Secondary | ICD-10-CM | POA: Insufficient documentation

## 2019-11-03 DIAGNOSIS — R079 Chest pain, unspecified: Secondary | ICD-10-CM | POA: Insufficient documentation

## 2019-11-03 DIAGNOSIS — D696 Thrombocytopenia, unspecified: Secondary | ICD-10-CM | POA: Insufficient documentation

## 2019-11-03 DIAGNOSIS — C61 Malignant neoplasm of prostate: Secondary | ICD-10-CM

## 2019-11-03 DIAGNOSIS — L570 Actinic keratosis: Secondary | ICD-10-CM | POA: Insufficient documentation

## 2019-11-03 DIAGNOSIS — R809 Proteinuria, unspecified: Secondary | ICD-10-CM | POA: Insufficient documentation

## 2019-11-03 DIAGNOSIS — K219 Gastro-esophageal reflux disease without esophagitis: Secondary | ICD-10-CM | POA: Insufficient documentation

## 2019-11-03 HISTORY — DX: Malignant neoplasm of prostate: C61

## 2019-11-03 NOTE — Progress Notes (Signed)
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 Prostate volume: 41.86 grams.  Miguel Miller is a former patient of Dr. Gaynelle Arabian. Patient stopped taking Finasteride in December 2018. Patient has been using Androgel but stopped three weeks ago as advised by Dr. Karsten Ro.     Biopsies of prostate (if applicable) revealed:   Past/Anticipated interventions by urology, if any: thallium laser vaporization, finasteride, prostate biopsy, referral for consideration of radioactive seeds.  Past/Anticipated interventions by medical oncology, if any: no  Weight changes, if any: no  Bowel/Bladder complaints, if any: IPSS 4. SHIM 6. Reports dysuria since biopsy that is gradually improving. Denies hematuria. Denies urinary leakage. Denies any bowel complaints.  Nausea/Vomiting, if any: denies  Pain issues, if any:  Reports having three lumbar surgeries where hardware has been placed. Reports the fourth spine surgery was on his cervical spine. Patient explains he is having cervical spine pain that radiates to his right shoulder 8 on a scale of 0-10. Patient reports he is scheduled for an MRI to further evaluate the source of this pain.   SAFETY ISSUES:  Prior radiation? denies  Pacemaker/ICD? denies  Possible current pregnancy? no, male patient  Is the patient on methotrexate? denies  Current Complaints / other details:  79 year old male. Retired Pharmacist, hospital. Married had two children.   Wife is presently receiving treatment under the care of Dr. Jana Hakim for metastatic breast cancer.

## 2019-11-04 ENCOUNTER — Other Ambulatory Visit: Payer: Self-pay

## 2019-11-04 ENCOUNTER — Ambulatory Visit
Admission: RE | Admit: 2019-11-04 | Discharge: 2019-11-04 | Disposition: A | Discharge status: Home / Self Care | Payer: Medicare PPO | Source: Ambulatory Visit | Attending: Radiation Oncology | Admitting: Radiation Oncology

## 2019-11-04 ENCOUNTER — Encounter: Payer: Self-pay | Admitting: Urology

## 2019-11-04 ENCOUNTER — Encounter: Payer: Self-pay | Admitting: Radiation Oncology

## 2019-11-04 VITALS — Ht 68.0 in | Wt 230.0 lb

## 2019-11-04 DIAGNOSIS — C61 Malignant neoplasm of prostate: Secondary | ICD-10-CM | POA: Insufficient documentation

## 2019-11-04 NOTE — Progress Notes (Signed)
See progress note under physician encounter. 

## 2019-11-04 NOTE — Progress Notes (Signed)
Radiation Oncology         [phone] ________________________________  Initial outpatient Consultation - Conducted via Telephone due to current COVID-19 concerns for limiting patient exposure  Name: Miguel Miller MRN: [id number]  Date: 11/04/2019  DOB: [birth year]  [id number], Lennette Bihari, MD  [name], MD   REFERRING PHYSICIAN: [name], MD  DIAGNOSIS: 79 y.o. gentleman with Stage T1c adenocarcinoma of the prostate with Gleason score of 3+4, and PSA of 8.38.    ICD-10-CM   1. Malignant neoplasm of prostate ([place])  C61     HISTORY OF PRESENT ILLNESS: Miguel Miller is a 79 y.o. male with a diagnosis of prostate cancer. He is an established urology patient followed for BPH with BOO and elevated PSA since at least 2017, managed with finasteride for many years. Dr. Gaynelle Arabian performed a thallium laser vaporization of the prostate on 10/02/2016 and he did have significant improvement in his LUTS following this procedure and was able to discontinue the finasteride.  His PSA was 5.24 (corrected for finasteride) in 01/2017. The patient's care was transferred to Dr. Karsten Ro in 01/2018 with Dr. Arlyn Leak retirement.  A PSA performed at that time was stable at 4.32, off finasteride.  He reports he had been using AndroGel, prescribed by his PCP, Dr. Rex Kras, for at least 5-10 years.   The patient returned to Dr. Karsten Ro for follow-up on 09/16/2019 with a PSA further elevated at 8.38, and digital rectal examination with a slightly enlarged left prostate lobe but without nodularity.  He proceeded to transrectal ultrasound with 12 biopsies of the prostate on 10/14/2019.  The prostate volume measured 41.86 cc.  Out of 12 core biopsies, 2 were positive.  The maximum Gleason score was 3+4, and this was seen in 10% of the left apex.  Additionally, Gleason 3+3 was seen in 5% of the left apex lateral.  He reports he stopped using the AndroGel about 3 weeks ago, as advised by Dr. Karsten Ro.  The patient reviewed  the biopsy results with his urologist and he has kindly been referred today for discussion of potential radiation treatment options.  His wife, Miguel Miller, is joining [place] on conference call for today's visit.  PREVIOUS RADIATION THERAPY: No  PAST MEDICAL HISTORY:  Past Medical History:  Diagnosis Date  . Actinic keratoses   . Arteriosclerosis   . BPH (benign prostatic hyperplasia)   . Diabetes mellitus type 2, controlled, without complications ([place])    diet controlled  . Diverticulosis   . DJD (degenerative joint disease)   . Dyslipidemia   . Eczema   . ED (erectile dysfunction)   . Essential tremor   . GERD (gastroesophageal reflux disease)   . Glaucoma    h/o retineal tear; pt denies glaucoma(09/29/16)  . Hypertension   . Low testosterone   . Peripheral vascular disease ([place])    ? PAD  . Prostate cancer ([place])   . Thrombocytopenia ([place])   . Thyroid goiter       PAST SURGICAL HISTORY: Past Surgical History:  Procedure Laterality Date  . BACK SURGERY     LUMBAR  2-3   03/2011  . BACK SURGERY     Lami/microdiscectomy 02/13/18 - Dr. Earnie Larsson  . BIOPSY THYROID    . c-spine  1999   Dr. Annette Stable  . CATARACT EXTRACTION  1997  . CATARACT EXTRACTION     LEFT   1998  . fusion L2  2012   Dr. Annette Stable  . HEMORRHOIDECTOMY WITH HEMORRHOID BANDING    .  KNEE SURGERY Bilateral 2005   Dr. Berenice Primas  . l-spine  2003   Dr. Annette Stable  . laser surgery for holes in retina Right 08/2012  . outpatient right shoulder  2008   Dr. Percell Miller  . PROSTATE SURGERY     09/2016  LASER  . PYLOROMYOTOMY     1944  . RETINAL DETACHMENT SURGERY  1996   Dr. Glennon Mac  . retineal tear  11/2012   Dr. Darrick Grinder  . SPINAL FUSION     Pedicle screw and interbody fusion L2-3, L3-4, and L4-5  . TOTAL HIP ARTHROPLASTY Left 06/05/2017  . TOTAL HIP ARTHROPLASTY Left 06/05/2017   Procedure: TOTAL HIP ARTHROPLASTY ANTERIOR APPROACH;  Surgeon: Renette Butters, MD;  Location: [facility];  Service: Orthopedics;  Laterality: Left;  [name]  VASECTOMY      FAMILY HISTORY:  Family History  Problem Relation Age of Onset  . Emphysema Father   . Arthritis Father   . Cancer Mother        GYN  . Diabetes Brother   . High blood pressure Brother   . Bladder Cancer Brother   . Heart disease Paternal Grandfather   . Prostate cancer Paternal Grandfather   . Thyroid nodules Neg Hx   . Colon cancer Neg Hx   . Pancreatic cancer Neg Hx   . Breast cancer Neg Hx     SOCIAL HISTORY:  Social History   Socioeconomic History  . Marital status: Married    Spouse name: Miguel Miller  . Number of children: 2  . Years of education: 16+  . Highest education level: Not on file  Occupational History  . Occupation: retired Pharmacist, hospital  Tobacco Use  . Smoking status: Never Smoker  . Smokeless tobacco: Never Used  Substance and Sexual Activity  . Alcohol use: Yes    Comment: rare  . Drug use: No  . Sexual activity: Not Currently  Other Topics Concern  . Not on file  Social History Narrative   Patient lives with his wife.   Patient has 2 children.   Patient is retired.   Patient drinks 1 glass of caffeine daily.   Patient has a college education.   Social Determinants of Health   Financial Resource Strain:   . Difficulty of Paying Living Expenses: Not on file  Food Insecurity:   . Worried About Charity fundraiser in the Last Year: Not on file  . Ran Out of Food in the Last Year: Not on file  Transportation Needs:   . Lack of Transportation (Medical): Not on file  . Lack of Transportation (Non-Medical): Not on file  Physical Activity:   . Days of Exercise per Week: Not on file  . Minutes of Exercise per Session: Not on file  Stress:   . Feeling of Stress : Not on file  Social Connections:   . Frequency of Communication with Friends and Family: Not on file  . Frequency of Social Gatherings with Friends and Family: Not on file  . Attends Religious Services: Not on file  . Active Member of Clubs or Organizations: Not on file  .  Attends Archivist Meetings: Not on file  . Marital Status: Not on file  Intimate Partner Violence:   . Fear of Current or Ex-Partner: Not on file  . Emotionally Abused: Not on file  . Physically Abused: Not on file  . Sexually Abused: Not on file    ALLERGIES: Tramadol, Hydrocodone bitartrate [hydrocodone], Oxycodone, Statins, and Welchol [  colesevelam hcl]  MEDICATIONS:  Current Outpatient Medications  Medication Sig Dispense Refill  . acetaminophen (TYLENOL) 500 MG tablet Take 1,000 mg by mouth every 6 (six) hours as needed for moderate pain or headache.    [name] aspirin EC 325 MG tablet Take 1 tablet (325 mg total) by mouth daily. For 30 days post op for DVT Prophylaxis 30 tablet 0  . diazepam (VALIUM) 5 MG tablet Take 1 tablet (5 mg total) by mouth every 8 (eight) hours as needed for muscle spasms. Do not take with other muscle spasm medicine or pain medication. 30 tablet 0  . docusate sodium (COLACE) 100 MG capsule Take 1 capsule (100 mg total) by mouth 2 (two) times daily. To prevent constipation while taking pain medication. 60 capsule 0  . ezetimibe (ZETIA) 10 MG tablet Take 10 mg by mouth every evening.     [name] HYDROmorphone (DILAUDID) 2 MG tablet     . lisinopril (PRINIVIL,ZESTRIL) 20 MG tablet Take 20 mg by mouth every evening.     . propranolol (INDERAL) 10 MG tablet Take 10 mg by mouth daily as needed (tremors).     . rosuvastatin (CRESTOR) 5 MG tablet Take 5 mg by mouth daily. Taking one tablet once per week.    . zolpidem (AMBIEN) 10 MG tablet Take 10 mg by mouth at bedtime as needed for sleep.    . NON FORMULARY Inject 0.5 mLs as directed as needed (ED).    [name] omeprazole (PRILOSEC) 20 MG capsule Take 1 capsule (20 mg total) by mouth daily. 30 days for gastroprotection while taking Aspirin. (Patient not taking: Reported on 11/04/2019) 30 capsule 0  . pregabalin (LYRICA) 75 MG capsule 1 capsule at night for 7 nights, then 1 capsule 2 times a day (Patient not taking: Reported  on 11/04/2019) 60 capsule 2   Current Facility-Administered Medications  Medication Dose Route Frequency Provider Last Rate Last Admin  . lidocaine (PF) (XYLOCAINE) 1 % injection 10 mL  10 mL Other Once Magnus Sinning, MD        REVIEW OF SYSTEMS:  On review of systems, the patient reports that he is doing well overall. He denies any chest pain, shortness of breath, cough, fevers, chills, night sweats, unintended weight changes. He denies any bowel disturbances, and denies abdominal pain, nausea or vomiting. He denies any new musculoskeletal or joint aches or pains. He does report chronic spinal issues and has had three previous lumbar spine surgeries, in which hardware was placed, and a cervical spine surgery. Today, he reports cervical spine pain radiating to his right shoulder, rated 8/10. His IPSS was 4, indicating mild urinary symptoms. He reports dysuria since his biopsy that is gradually improving. His SHIM was 6, indicating he has severe erectile dysfunction. He had previously used intracavernosal injections. A complete review of systems is obtained and is otherwise negative.    PHYSICAL EXAM:  Wt Readings from Last 3 Encounters:  11/04/19 230 lb (104.3 kg)  11/07/18 245 lb (111.1 kg)  01/08/18 242 lb (109.8 kg)   Temp Readings from Last 3 Encounters:  06/06/17 98.2 F (36.8 C) (Oral)  05/25/17 98.2 F (36.8 C)  10/02/16 97.7 F (36.5 C)   BP Readings from Last 3 Encounters:  04/08/19 (!) 144/81  03/20/19 (!) 159/91  03/14/19 (!) 161/80   Pulse Readings from Last 3 Encounters:  04/08/19 83  03/20/19 65  03/14/19 68   Pain Assessment Pain Score: 8 (interrupts sleep) Pain Frequency: Constant Pain Loc: Back(cervical  spine pain radiating to right shoulder.)/10  Physical exam not performed in light of telephone encounter.   KPS = 100  100 - Normal; no complaints; no evidence of disease. 90   - Able to carry on normal activity; minor signs or symptoms of disease. 80    - Normal activity with effort; some signs or symptoms of disease. 83   - Cares for self; unable to carry on normal activity or to do active work. 60   - Requires occasional assistance, but is able to care for most of his personal needs. 50   - Requires considerable assistance and frequent medical care. 34   - Disabled; requires special care and assistance. 56   - Severely disabled; hospital admission is indicated although death not imminent. 75   - Very sick; hospital admission necessary; active supportive treatment necessary. 10   - Moribund; fatal processes progressing rapidly. 0     - Dead  Karnofsky DA, Abelmann Everman, Craver LS and Burchenal [facility] [phone]) The use of the nitrogen mustards in the palliative treatment of carcinoma: with particular reference to bronchogenic carcinoma Cancer 1 634-56  LABORATORY DATA:  Lab Results  Component Value Date   WBC 4.8 05/25/2017   HGB 15.9 05/25/2017   HCT 50.7 05/25/2017   MCV 92.9 05/25/2017   PLT 134 (L) 05/25/2017   Lab Results  Component Value Date   NA 136 05/25/2017   K 4.6 05/25/2017   CL 105 05/25/2017   CO2 24 05/25/2017   No results found for: ALT, AST, GGT, ALKPHOS, BILITOT   RADIOGRAPHY: No results found.    IMPRESSION/PLAN: This visit was conducted via Telephone to spare the patient unnecessary potential exposure in the healthcare setting during the current COVID-19 pandemic. 1. 80 y.o. gentleman with Stage T1c adenocarcinoma of the prostate with Gleason Score of 3+4, and PSA of 8.38. We discussed the patient's workup and outlined the nature of prostate cancer in this setting. The patient's T stage, Gleason's score, and PSA put him into the favorable intermediate risk group. Accordingly, he is eligible for a variety of potential treatment options including brachytherapy, 5.5 weeks of external radiation or prostatectomy. We discussed the available radiation techniques, and focused on the details and logistics of delivery. We  discussed and outlined the risks, benefits, short and long-term effects associated with radiotherapy and compared and contrasted these with prostatectomy. We discussed the role of SpaceOAR in reducing the rectal toxicity associated with radiotherapy.  He and his wife for encouraged to ask questions that were answered to their stated satisfaction.  At the end of the conversation, the patient is leaning towards moving forward with brachytherapy and use of SpaceOAR gel to reduce rectal toxicity from radiotherapy.  We will share our discussion with Dr. Karsten Ro and move forward with scheduling his procedure and [facility] planning appointment in the near future.  The patient will be contacted by Romie Jumper in our office who will be working closely with him to coordinate OR scheduling and pre and post procedure appointments.  We will also contact the pharmaceutical rep to ensure that [place] is available at the time of procedure.  He will have a prostate MRI following his post-seed CT SIM to confirm appropriate distribution of the [place].  He appears to have a good understanding of his disease and our treatment recommendations which are of curative intent and is in agreement with the stated plan.  We will move forward with treatment planning accordingly.   Given current  concerns for patient exposure during the COVID-19 pandemic, this encounter was conducted via telephone. The patient was notified in advance and was offered a MyChart meeting to allow for face to face communication but unfortunately reported that he did not have the appropriate resources/technology to support such a visit and instead preferred to proceed with telephone consult. The patient has given verbal consent for this type of encounter. The time spent during this encounter was 100 minutes. The attendants for this meeting include Tyler Pita MD, Ashlyn Bruning PA-C, [place], and patient, Erron Mandala and his wife,  Miguel Miller. During the encounter, Tyler Pita MD, Ashlyn Bruning PA-C, and scribe, Wilburn Mylar were located at [place].  Patient, Zaylor Jaquish and wife, Miguel Miller were located at home.    Nicholos Johns, PA-C    Tyler Pita, MD  [facility] Direct Dial: [phone]  Fax: [phone] [email or web address]  Skype  [email or web address]  This document serves as a record of services personally performed by Tyler Pita, MD and Freeman Caldron, PA-C. It was created on their behalf by Wilburn Mylar, a trained medical scribe. The creation of this record is based on the scribe's personal observations and the provider's statements to them. This document has been checked and approved by the attending provider.

## 2019-11-06 ENCOUNTER — Telehealth: Payer: Self-pay | Admitting: *Deleted

## 2019-11-06 ENCOUNTER — Ambulatory Visit: Payer: Medicare PPO | Attending: Internal Medicine

## 2019-11-06 DIAGNOSIS — Z23 Encounter for immunization: Secondary | ICD-10-CM | POA: Insufficient documentation

## 2019-11-06 NOTE — Progress Notes (Signed)
   Covid-19 Vaccination Clinic  Name:  Christoffer Novakovic    MRN: [id number] DOB: [birth year]  11/06/2019  Mr. Borg was observed post Covid-19 immunization for 15 minutes without incident. He was provided with Vaccine Information Sheet and instruction to access the V-Safe system.   Mr. Sposito was instructed to call 911 with any severe reactions post vaccine: [name] Difficulty breathing  . Swelling of face and throat  . A fast heartbeat  . A bad rash all over body  . Dizziness and weakness   Immunizations Administered    Name Date Dose VIS Date Route   Pfizer COVID-19 Vaccine 11/06/2019  2:27 PM 0.3 mL 08/15/2019 Intramuscular   Manufacturer: [place]   Lot: [id number]   [place]: [id number]

## 2019-11-06 NOTE — Telephone Encounter (Signed)
Called patient to ask questions, lvm for a return call

## 2019-11-07 ENCOUNTER — Telehealth: Payer: Self-pay | Admitting: Medical Oncology

## 2019-11-07 NOTE — Progress Notes (Signed)
Called patient to introduce myself as the prostate nurse navigator and discuss my role. I was unable to meet him 3/2 when he consulted with Dr. Tammi Klippel. He states he is having spine issues and is having a MRI next week. He is not sure if  he will need surgery or not, so he is not sure how to proceed with seed implant.I told him we can see what the MRI shows before we get the seed implant scheduled. I will update Enid Derry and he will call me when he finds out more regarding spine.

## 2019-11-11 ENCOUNTER — Other Ambulatory Visit: Payer: Self-pay

## 2019-11-11 ENCOUNTER — Ambulatory Visit
Admission: RE | Admit: 2019-11-11 | Discharge: 2019-11-11 | Disposition: A | Discharge status: Home / Self Care | Payer: Medicare PPO | Source: Ambulatory Visit | Attending: Neurosurgery | Admitting: Neurosurgery

## 2019-11-11 DIAGNOSIS — M5412 Radiculopathy, cervical region: Secondary | ICD-10-CM

## 2019-11-19 ENCOUNTER — Other Ambulatory Visit: Payer: Self-pay | Admitting: Neurosurgery

## 2019-11-21 ENCOUNTER — Telehealth: Payer: Self-pay | Admitting: Medical Oncology

## 2019-11-21 NOTE — Progress Notes (Signed)
[place] [place] [address] Phone: [phone] Fax: [phone]      Your procedure is scheduled on November 28, 2019.  Report to [facility] Main Entrance "A" at 10:30 A.M., and check in at the Admitting office.  Call this number if you have problems the morning of surgery:  [phone]  Call [phone] if you have any questions prior to your surgery date Monday-Friday 8am-4pm    Remember:  Do not eat or drink after midnight the night before your surgery    Take these medicines the morning of surgery with A SIP OF WATER : Acetaminophen (Tylenol) if needed Hydromorphone (Dilaudid) if needed moderate pain Propranolol (Inderal)  Follow your surgeon's instructions on when to stop Aspirin.  If no instructions were given by your surgeon then you will need to call the office to get those instructions.     As of today, stop taking all Aspirin (unless instructed by your doctor) and other Aspirin containing products, Vitamins, Fish Oils, and Herbal Medications. Also stop all NSAIDS i.e. Advil, Ibuprofen, Motrin, Aleve, Anaprox, Naproxen, BC, Goody Powders, and all Supplements.    HOW TO MANAGE YOUR DIABETES BEFORE AND AFTER SURGERY  Why is it important to control my blood sugar before and after surgery? . Improving blood sugar levels before and after surgery helps healing and can limit problems. . A way of improving blood sugar control is eating a healthy diet by: o  Eating less sugar and carbohydrates o  Increasing activity/exercise o  Talking with your doctor about reaching your blood sugar goals . High blood sugars (greater than 180 mg/dL) can raise your risk of infections and slow your recovery, so you will need to focus on controlling your diabetes during the weeks before surgery. . Make sure that the doctor who takes care of your diabetes knows about your planned surgery including the date and  location.  How do I manage my blood sugar before surgery? . Check your blood sugar at least 4 times a day, starting 2 days before surgery, to make sure that the level is not too high or low. . Check your blood sugar the morning of your surgery when you wake up and every 2 hours until you get to the Short Stay unit. o If your blood sugar is less than 70 mg/dL, you will need to treat for low blood sugar: - Do not take insulin. - Treat a low blood sugar (less than 70 mg/dL) with  cup of clear juice (cranberry or apple), 4 glucose tablets, OR glucose gel. - Recheck blood sugar in 15 minutes after treatment (to make sure it is greater than 70 mg/dL). If your blood sugar is not greater than 70 mg/dL on recheck, call [phone] for further instructions. . Report your blood sugar to the short stay nurse when you get to Short Stay.  . If you are admitted to the hospital after surgery: o Your blood sugar will be checked by the staff and you will probably be given insulin after surgery (instead of oral diabetes medicines) to make sure you have good blood sugar levels. o The goal for blood sugar control after surgery is 80-180 mg/dL.     No Smoking of any kind, Tobacco, or Alcohol products 24 hours prior to your procedure. If you use a CPAP at night, you may bring all equipment for your overnight stay.  Do not wear jewelry.            Do not wear lotions, powders, perfumes/colognes, or deodorant.            Do not shave 48 hours prior to surgery.  Men may shave face and neck.            Do not bring valuables to the hospital.            [facility] is not responsible for any belongings or valuables.   Contacts, glasses, dentures or bridgework may not be worn into surgery.      For patients admitted to the hospital, discharge time will be determined by your treatment team.   Patients discharged the day of surgery will not be allowed to drive home, and someone needs to stay  with them for 24 hours.    Special instructions:   [facility]- Preparing For Surgery  Before surgery, you can play an important role. Because skin is not sterile, your skin needs to be as free of germs as possible. You can reduce the number of germs on your skin by washing with CHG (chlorahexidine gluconate) Soap before surgery.  CHG is an antiseptic cleaner which kills germs and bonds with the skin to continue killing germs even after washing.    Oral Hygiene is also important to reduce your risk of infection.  Remember - BRUSH YOUR TEETH THE MORNING OF SURGERY WITH YOUR REGULAR TOOTHPASTE  Please do not use if you have an allergy to CHG or antibacterial soaps. If your skin becomes reddened/irritated stop using the CHG.  Do not shave (including legs and underarms) for at least 48 hours prior to first CHG shower. It is OK to shave your face.  Please follow these instructions carefully.   1. Shower the NIGHT BEFORE SURGERY and the MORNING OF SURGERY with CHG Soap.   2. If you chose to wash your hair, wash your hair first as usual with your normal shampoo.  3. After you shampoo, rinse your hair and body thoroughly to remove the shampoo.  4. Use CHG as you would any other liquid soap. You can apply CHG directly to the skin and wash gently with a scrungie or a clean washcloth.   5. Apply the CHG Soap to your body ONLY FROM THE NECK DOWN.  Do not use on open wounds or open sores. Avoid contact with your eyes, ears, mouth and genitals (private parts). Wash Face and genitals (private parts)  with your normal soap.   6. Wash thoroughly, paying special attention to the area where your surgery will be performed.  7. Thoroughly rinse your body with warm water from the neck down.  8. DO NOT shower/wash with your normal soap after using and rinsing off the CHG Soap.  9. Pat yourself dry with a CLEAN TOWEL.  10. Wear CLEAN PAJAMAS to bed the night before surgery, wear comfortable clothes the  morning of surgery  11. Place CLEAN SHEETS on your bed the night of your first shower and DO NOT SLEEP WITH PETS.   Day of Surgery:   Do not apply any deodorants/lotions.  Please wear clean clothes to the hospital/surgery center.   Remember to brush your teeth WITH YOUR REGULAR TOOTHPASTE.   Please read over the following fact sheets that you were given.

## 2019-11-21 NOTE — Telephone Encounter (Signed)
Returned call to patient regarding back surgery. He states is scheduled for disc surgery next Friday with Dr. Annette Stable so he will need to delay tx for prostate. He also states he has been doing more research on seed implant and external beam and is leaning more toward 5.5 weeks of radiation. He will call me after his post op visit with Dr. Annette Stable and let me know how long he needs to wait to be start radiaiton. I have notified  Ashlyn and Enid Derry of the above.

## 2019-11-24 ENCOUNTER — Other Ambulatory Visit: Payer: Self-pay

## 2019-11-24 ENCOUNTER — Encounter (HOSPITAL_COMMUNITY)
Admission: RE | Admit: 2019-11-24 | Discharge: 2019-11-24 | Disposition: A | Discharge status: Home / Self Care | Payer: Medicare PPO | Source: Ambulatory Visit | Attending: Neurosurgery | Admitting: Neurosurgery

## 2019-11-24 ENCOUNTER — Encounter (HOSPITAL_COMMUNITY): Payer: Self-pay

## 2019-11-24 DIAGNOSIS — Z981 Arthrodesis status: Secondary | ICD-10-CM | POA: Insufficient documentation

## 2019-11-24 DIAGNOSIS — Z01818 Encounter for other preprocedural examination: Secondary | ICD-10-CM | POA: Diagnosis not present

## 2019-11-24 DIAGNOSIS — R9431 Abnormal electrocardiogram [ECG] [EKG]: Secondary | ICD-10-CM | POA: Insufficient documentation

## 2019-11-24 DIAGNOSIS — E119 Type 2 diabetes mellitus without complications: Secondary | ICD-10-CM | POA: Diagnosis not present

## 2019-11-24 LAB — CBC WITH DIFFERENTIAL/PLATELET
Abs Immature Granulocytes: 0.01 10*3/uL (ref 0.00–0.07)
Basophils Absolute: 0 10*3/uL (ref 0.0–0.1)
Basophils Relative: 0 %
Eosinophils Absolute: 0.1 10*3/uL (ref 0.0–0.5)
Eosinophils Relative: 3 %
HCT: 50.7 % (ref 39.0–52.0)
Hemoglobin: 16.3 g/dL (ref 13.0–17.0)
Immature Granulocytes: 0 %
Lymphocytes Relative: 20 %
Lymphs Abs: 1 10*3/uL (ref 0.7–4.0)
MCH: 30.4 pg (ref 26.0–34.0)
MCHC: 32.1 g/dL (ref 30.0–36.0)
MCV: 94.4 fL (ref 80.0–100.0)
Monocytes Absolute: 0.4 10*3/uL (ref 0.1–1.0)
Monocytes Relative: 7 %
Neutro Abs: 3.7 10*3/uL (ref 1.7–7.7)
Neutrophils Relative %: 70 %
Platelets: 129 10*3/uL — ABNORMAL LOW (ref 150–400)
RBC: 5.37 MIL/uL (ref 4.22–5.81)
RDW: 12.4 % (ref 11.5–15.5)
WBC: 5.3 10*3/uL (ref 4.0–10.5)
nRBC: 0 % (ref 0.0–0.2)

## 2019-11-24 LAB — BASIC METABOLIC PANEL
Anion gap: 9 (ref 5–15)
BUN: 26 mg/dL — ABNORMAL HIGH (ref 8–23)
CO2: 21 mmol/L — ABNORMAL LOW (ref 22–32)
Calcium: 8.9 mg/dL (ref 8.9–10.3)
Chloride: 106 mmol/L (ref 98–111)
Creatinine, Ser: 1.17 mg/dL (ref 0.61–1.24)
GFR calc Af Amer: >60 mL/min (ref >60)
GFR calc non Af Amer: >60 mL/min (ref >60)
Glucose, Bld: 291 mg/dL — ABNORMAL HIGH (ref 70–99)
Potassium: 4.4 mmol/L (ref 3.5–5.1)
Sodium: 136 mmol/L (ref 135–145)

## 2019-11-24 LAB — SURGICAL PCR SCREEN
MRSA, PCR: NEGATIVE
Staphylococcus aureus: NEGATIVE

## 2019-11-24 LAB — GLUCOSE, CAPILLARY: Glucose-Capillary: 284 mg/dL — ABNORMAL HIGH (ref 70–99)

## 2019-11-24 LAB — HEMOGLOBIN A1C
Hgb A1c MFr Bld: 7.7 % — ABNORMAL HIGH (ref 4.8–5.6)
Mean Plasma Glucose: 174.29 mg/dL

## 2019-11-24 NOTE — Progress Notes (Signed)
PCP -  Lennette Bihari little  @ [employer] - croitoru saw 2017,pt. States he only saw him once    Chest x-ray - na EKG - 11/24/19 Stress Test - 11/17 ECHO - 12/17 Cardiac Cath - na  Sleep Study -  na CPAP -   Fasting Blood Sugar - hasn't checked in 4 yrs Checks Blood Sugar ___none__ times a day  Blood Thinner Instructions: Aspirin Instructions: stopped 5 days ago   COVID TEST- 11/26/19   Anesthesia review: cardiac hx.  Patient denies shortness of breath, fever, cough and chest pain at PAT appointment   All instructions explained to the patient, with a verbal understanding of the material. Patient agrees to go over the instructions while at home for a better understanding. Patient also instructed to self quarantine after being tested for COVID-19. The opportunity to ask questions was provided.

## 2019-11-25 NOTE — Progress Notes (Addendum)
Anesthesia Chart Review:  Seen by cardiology in 2017 for eval of coronary calcifications noticed on CT. He underwent a nuclear stress test that was normal, but the study reported a slightly low ejection fraction of 49%. Subsequent echo showed normal EF. There were no regional wall motion abnormalities. Doppler study suggested diastolic dysfunction, but normal left atrial filling pressure. The left atrium was mildly dilated. Last saw Dr. Sallyanne Kuster 07/03/16. He has not had any cardiac followup since that time but he was cleared by Dr. Sallyanne Kuster in September 2018 as low risk for left THA which he underwent without complication.   Hx of C4-6 ACDF.  Preop labs reviewed. DMII with CBG 284 and A1c 7.7. Platelets mildly low at 129k. Otherwise unremarkable.   Patient's DMII is listed as "diet controlled" and he takes no medications for this, does not check BG. CBG and A1c were called to Dr. Marchelle Folks office and routed to pt's PCP Dr. [name]. Diabetes coordinator consulted. I also called and spoke with the patient. He reported he was on metformin years ago but for about the past 4 years he has done well controlling DMII with diet with A1c consistently under 7. He was surprised to learn his DMII is now not well controlled. He understands the importance of BG management and will work on better dietary control and will followup with PCP.   EKG 11/24/19: Normal sinus rhythm. Rate 76. Left axis deviation. No significant change.   MRI C-spine 11/11/19: IMPRESSION: 1. C3-4 cord compression due to disc protrusion and ligamentum flavum thickening. There is also facet spurring and right foraminal stenosis. 2. C4-C6 ACDF with solid arthrodesis. 3. 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   Echo 08/10/16:  - Left ventricle: The cavity size was normal. Wall thickness was normal. The estimated ejection fraction was 55%. Although no diagnostic regional wall motion abnormality was identified, this possibility cannot be completely  excluded on the basis of this study. Doppler parameters are consistent with abnormal left ventricular relaxation (grade 1 diastolic dysfunction). - Aortic valve: There was no stenosis. There was trivial regurgitation. - Mitral valve: There was no significant regurgitation. - Right ventricle: The cavity size was normal. Systolic function was normal. - Pulmonary arteries: No complete TR doppler jet so unable to estimate PA systolic pressure. - Inferior vena cava: The vessel was normal in size. The respirophasic diameter changes were in the normal range (>= 50%), consistent with normal central venous pressure. - Impressions: Normal LV size and systolic function, EF [id number]. Normal RV size andsystolic function. No significant valvular abnormalities.  Nuclear stress test 08/02/16:   Nuclear stress EF: 44%. Visually, the EF appears to be closer to 55% and is normal . Suggest echo for further assessment of LV EF if not already done .  There was no ST segment deviation noted during stress.  The study is normal.  This is a low risk study.  Wynonia Musty [facility]/Anesthesiology Phone [phone] 11/25/2019 4:32 PM

## 2019-11-26 ENCOUNTER — Other Ambulatory Visit (HOSPITAL_COMMUNITY)
Admission: RE | Admit: 2019-11-26 | Discharge: 2019-11-26 | Disposition: A | Discharge status: Home / Self Care | Payer: Medicare PPO | Source: Ambulatory Visit | Attending: Neurosurgery | Admitting: Neurosurgery

## 2019-11-26 DIAGNOSIS — E1151 Type 2 diabetes mellitus with diabetic peripheral angiopathy without gangrene: Secondary | ICD-10-CM | POA: Diagnosis not present

## 2019-11-26 DIAGNOSIS — Z20822 Contact with and (suspected) exposure to covid-19: Secondary | ICD-10-CM | POA: Diagnosis not present

## 2019-11-26 DIAGNOSIS — Z885 Allergy status to narcotic agent status: Secondary | ICD-10-CM | POA: Diagnosis not present

## 2019-11-26 DIAGNOSIS — I10 Essential (primary) hypertension: Secondary | ICD-10-CM | POA: Diagnosis not present

## 2019-11-26 DIAGNOSIS — Z79899 Other long term (current) drug therapy: Secondary | ICD-10-CM | POA: Diagnosis not present

## 2019-11-26 DIAGNOSIS — Z96642 Presence of left artificial hip joint: Secondary | ICD-10-CM | POA: Diagnosis not present

## 2019-11-26 DIAGNOSIS — Z7982 Long term (current) use of aspirin: Secondary | ICD-10-CM | POA: Diagnosis not present

## 2019-11-26 DIAGNOSIS — N400 Enlarged prostate without lower urinary tract symptoms: Secondary | ICD-10-CM | POA: Diagnosis not present

## 2019-11-26 DIAGNOSIS — E119 Type 2 diabetes mellitus without complications: Secondary | ICD-10-CM | POA: Diagnosis not present

## 2019-11-26 DIAGNOSIS — Z888 Allergy status to other drugs, medicaments and biological substances status: Secondary | ICD-10-CM | POA: Diagnosis not present

## 2019-11-26 DIAGNOSIS — K219 Gastro-esophageal reflux disease without esophagitis: Secondary | ICD-10-CM | POA: Diagnosis not present

## 2019-11-26 DIAGNOSIS — M4802 Spinal stenosis, cervical region: Secondary | ICD-10-CM | POA: Diagnosis not present

## 2019-11-26 DIAGNOSIS — G959 Disease of spinal cord, unspecified: Secondary | ICD-10-CM | POA: Diagnosis present

## 2019-11-26 LAB — SARS CORONAVIRUS 2 (TAT 6-24 HRS): SARS Coronavirus 2: NEGATIVE

## 2019-11-26 NOTE — Anesthesia Preprocedure Evaluation (Addendum)
Anesthesia Evaluation  Patient identified by MRN, date of birth, ID band Patient awake    Reviewed: Allergy & Precautions, NPO status , Patient's Chart, lab work & pertinent test results  History of Anesthesia Complications Negative for: history of anesthetic complications  Airway Mallampati: III  TM Distance: >3 FB Neck ROM: Full    Dental no notable dental hx. (+) Missing, Dental Advisory Given   Pulmonary neg pulmonary ROS,    Pulmonary exam normal        Cardiovascular hypertension, Pt. on home beta blockers + CAD  Normal cardiovascular exam  Seen by cardiology in 2017 for eval of coronary calcifications noticed on CT. He underwent a nuclear stress test that was normal, but the study reported a slightly low ejection fraction of 49%. Subsequent echo showed normal EF. There were no regional wall motion abnormalities. Doppler study suggested diastolic dysfunction, but normal left atrial filling pressure. The left atrium was mildly dilated. Last saw Dr. Sallyanne Kuster 07/03/16. He has not had any cardiac followup since that time but he was cleared by Dr. Sallyanne Kuster in September 2018 as low risk for left THA which he underwent without complication.      Neuro/Psych negative neurological ROS     GI/Hepatic Neg liver ROS, GERD  ,  Endo/Other  diabetes  Renal/GU negative Renal ROS     Musculoskeletal negative musculoskeletal ROS (+)   Abdominal   Peds  Hematology negative hematology ROS (+)   Anesthesia Other Findings Day of surgery medications reviewed with the patient.  Reproductive/Obstetrics                           Anesthesia Physical Anesthesia Plan  ASA: III  Anesthesia Plan: General   Post-op Pain Management:    Induction: Intravenous  PONV Risk Score and Plan: 3 and Ondansetron, Dexamethasone and Midazolam  Airway Management Planned: Oral ETT  Additional Equipment:   Intra-op Plan:    Post-operative Plan: Extubation in OR  Informed Consent: I have reviewed the patients History and Physical, chart, labs and discussed the procedure including the risks, benefits and alternatives for the proposed anesthesia with the patient or authorized representative who has indicated his/her understanding and acceptance.     Dental advisory given  Plan Discussed with: CRNA and Anesthesiologist  Anesthesia Plan Comments: (Seen by cardiology in 2017 for eval of coronary calcifications noticed on CT. He underwent a nuclear stress test that was normal, but the study reported a slightly low ejection fraction of 49%. Subsequent echo showed normal EF. There were no regional wall motion abnormalities. Doppler study suggested diastolic dysfunction, but normal left atrial filling pressure. The left atrium was mildly dilated. Last saw Dr. Sallyanne Kuster 07/03/16. He has not had any cardiac followup since that time but he was cleared by Dr. Sallyanne Kuster in September 2018 as low risk for left THA which he underwent without complication.   Hx C4-6 ACDF.  Preop labs reviewed. DMII with CBG 284 and A1c 7.7. Platelets mildly low at 129k. Otherwise unremarkable.   Patient's DMII is listed as "diet controlled" and he takes no medications for this, does not check BG. CBG and A1c were called to Dr. Marchelle Folks office and routed to pt's PCP Dr. [name]. Diabetes coordinator consulted. I also called and spoke with the patient. He reported he was on metformin years ago but for about the past 4 years he has done well controlling DMII with diet with A1c consistently under 7. He  was surprised to learn his DMII is now not well controlled. He understands the importance of BG management and will work on better dietary control and will followup with PCP.  EKG 11/24/19: Normal sinus rhythm. Rate 76. Left axis deviation. No significant change.   MRI C-spine 11/11/19: IMPRESSION: 1. C3-4 cord compression due to disc protrusion and  ligamentum flavum thickening. There is also facet spurring and right foraminal stenosis. 2. C4-C6 ACDF with solid arthrodesis. 3. 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   Echo 08/10/16:  - Left ventricle: The cavity size was normal. Wall thickness was normal. The estimated ejection fraction was 55%. Although no diagnostic regional wall motion abnormality was identified, this possibility cannot be completely excluded on the basis of this study. Doppler parameters are consistent with abnormal left ventricular relaxation (grade 1 diastolic dysfunction). - Aortic valve: There was no stenosis. There was trivial regurgitation. - Mitral valve: There was no significant regurgitation. - Right ventricle: The cavity size was normal. Systolic function was normal. - Pulmonary arteries: No complete TR doppler jet so unable to estimate PA systolic pressure. - Inferior vena cava: The vessel was normal in size. The respirophasic diameter changes were in the normal range (>= 50%), consistent with normal central venous pressure. - Impressions: Normal LV size and systolic function, EF [id number]. Normal RV size andsystolic function. No significant valvular abnormalities.  Nuclear stress test 08/02/16:  Nuclear stress EF: 44%. Visually, the EF appears to be closer to 55% and is normal . Suggest echo for further assessment of LV EF if not already done . There was no ST segment deviation noted during stress. The study is normal. This is a low risk study.)     Anesthesia Quick Evaluation

## 2019-11-28 ENCOUNTER — Other Ambulatory Visit: Payer: Self-pay

## 2019-11-28 ENCOUNTER — Ambulatory Visit (HOSPITAL_COMMUNITY): Payer: Medicare PPO | Admitting: Physician Assistant

## 2019-11-28 ENCOUNTER — Encounter (HOSPITAL_COMMUNITY): Payer: Self-pay | Admitting: Neurosurgery

## 2019-11-28 ENCOUNTER — Encounter (HOSPITAL_COMMUNITY)
Admission: RE | Disposition: A | Discharge status: Home / Self Care | Payer: Self-pay | Source: Home / Self Care | Attending: Neurosurgery

## 2019-11-28 ENCOUNTER — Ambulatory Visit (HOSPITAL_COMMUNITY): Payer: Medicare PPO

## 2019-11-28 ENCOUNTER — Observation Stay (HOSPITAL_COMMUNITY)
Admission: RE | Admit: 2019-11-28 | Discharge: 2019-11-29 | Disposition: A | Discharge status: Home / Self Care | Payer: Medicare PPO | Attending: Neurosurgery | Admitting: Neurosurgery

## 2019-11-28 DIAGNOSIS — Z7982 Long term (current) use of aspirin: Secondary | ICD-10-CM | POA: Insufficient documentation

## 2019-11-28 DIAGNOSIS — M4802 Spinal stenosis, cervical region: Secondary | ICD-10-CM | POA: Diagnosis not present

## 2019-11-28 DIAGNOSIS — N400 Enlarged prostate without lower urinary tract symptoms: Secondary | ICD-10-CM | POA: Insufficient documentation

## 2019-11-28 DIAGNOSIS — K219 Gastro-esophageal reflux disease without esophagitis: Secondary | ICD-10-CM | POA: Insufficient documentation

## 2019-11-28 DIAGNOSIS — Z885 Allergy status to narcotic agent status: Secondary | ICD-10-CM | POA: Insufficient documentation

## 2019-11-28 DIAGNOSIS — I10 Essential (primary) hypertension: Secondary | ICD-10-CM | POA: Insufficient documentation

## 2019-11-28 DIAGNOSIS — Z419 Encounter for procedure for purposes other than remedying health state, unspecified: Secondary | ICD-10-CM

## 2019-11-28 DIAGNOSIS — Z96642 Presence of left artificial hip joint: Secondary | ICD-10-CM | POA: Insufficient documentation

## 2019-11-28 DIAGNOSIS — E119 Type 2 diabetes mellitus without complications: Secondary | ICD-10-CM | POA: Diagnosis not present

## 2019-11-28 DIAGNOSIS — E1151 Type 2 diabetes mellitus with diabetic peripheral angiopathy without gangrene: Secondary | ICD-10-CM | POA: Insufficient documentation

## 2019-11-28 DIAGNOSIS — Z888 Allergy status to other drugs, medicaments and biological substances status: Secondary | ICD-10-CM | POA: Insufficient documentation

## 2019-11-28 DIAGNOSIS — Z20822 Contact with and (suspected) exposure to covid-19: Secondary | ICD-10-CM | POA: Insufficient documentation

## 2019-11-28 DIAGNOSIS — Z79899 Other long term (current) drug therapy: Secondary | ICD-10-CM | POA: Insufficient documentation

## 2019-11-28 DIAGNOSIS — G959 Disease of spinal cord, unspecified: Secondary | ICD-10-CM | POA: Diagnosis present

## 2019-11-28 HISTORY — PX: ANTERIOR CERVICAL DECOMP/DISCECTOMY FUSION: SHX1161

## 2019-11-28 LAB — GLUCOSE, CAPILLARY
Glucose-Capillary: 150 mg/dL — ABNORMAL HIGH (ref 70–99)
Glucose-Capillary: 137 mg/dL — ABNORMAL HIGH (ref 70–99)
Glucose-Capillary: 116 mg/dL — ABNORMAL HIGH (ref 70–99)
Glucose-Capillary: 209 mg/dL — ABNORMAL HIGH (ref 70–99)
Glucose-Capillary: 240 mg/dL — ABNORMAL HIGH (ref 70–99)

## 2019-11-28 SURGERY — ANTERIOR CERVICAL DECOMPRESSION/DISCECTOMY FUSION 1 LEVEL
Anesthesia: General | Site: Spine Cervical

## 2019-11-28 MED ORDER — SODIUM CHLORIDE 0.9 % IV SOLN: 250.0000 mL | INTRAVENOUS | Status: DC

## 2019-11-28 MED ORDER — ONDANSETRON HCL 4 MG/2ML IJ SOLN
INTRAMUSCULAR | Status: DC | PRN
  Administered 2019-11-28: 4 mg via INTRAVENOUS

## 2019-11-28 MED ORDER — FENTANYL CITRATE (PF) 100 MCG/2ML IJ SOLN
INTRAMUSCULAR | Status: AC
  Filled 2019-11-28: qty 2

## 2019-11-28 MED ORDER — DEXAMETHASONE SODIUM PHOSPHATE 10 MG/ML IJ SOLN
10.0000 mg | Freq: Once | INTRAMUSCULAR | Status: AC
  Administered 2019-11-28: 10 mg via INTRAVENOUS

## 2019-11-28 MED ORDER — DIAZEPAM 5 MG PO TABS
5.0000 mg | ORAL_TABLET | Freq: Three times a day (TID) | ORAL | Status: DC | PRN
  Administered 2019-11-28 – 2019-11-29 (×2): 5 mg via ORAL
  Filled 2019-11-28 (×2): qty 1

## 2019-11-28 MED ORDER — SODIUM CHLORIDE 0.9% FLUSH: 3.0000 mL | INTRAVENOUS | Status: DC | PRN

## 2019-11-28 MED ORDER — ACETAMINOPHEN 650 MG RE SUPP: 650.0000 mg | RECTAL | Status: DC | PRN

## 2019-11-28 MED ORDER — HEMOSTATIC AGENTS (NO CHARGE) OPTIME
TOPICAL | Status: DC | PRN
  Administered 2019-11-28: 1 via TOPICAL

## 2019-11-28 MED ORDER — ACETAMINOPHEN 500 MG PO TABS: 1000.0000 mg | ORAL_TABLET | Freq: Once | ORAL | Status: AC

## 2019-11-28 MED ORDER — DEXAMETHASONE SODIUM PHOSPHATE 10 MG/ML IJ SOLN
INTRAMUSCULAR | Status: AC
  Filled 2019-11-28: qty 1

## 2019-11-28 MED ORDER — SUGAMMADEX SODIUM 200 MG/2ML IV SOLN
INTRAVENOUS | Status: DC | PRN
  Administered 2019-11-28: 225 mg via INTRAVENOUS

## 2019-11-28 MED ORDER — FENTANYL CITRATE (PF) 100 MCG/2ML IJ SOLN
INTRAMUSCULAR | Status: DC | PRN
  Administered 2019-11-28: 50 ug via INTRAVENOUS
  Administered 2019-11-28 (×2): 75 ug via INTRAVENOUS
  Administered 2019-11-28: 50 ug via INTRAVENOUS

## 2019-11-28 MED ORDER — CEFAZOLIN SODIUM-DEXTROSE 1-4 GM/50ML-% IV SOLN
1.0000 g | Freq: Three times a day (TID) | INTRAVENOUS | Status: AC
  Administered 2019-11-28 – 2019-11-29 (×2): 1 g via INTRAVENOUS
  Filled 2019-11-28 (×2): qty 50

## 2019-11-28 MED ORDER — THROMBIN 5000 UNITS EX SOLR
CUTANEOUS | Status: AC
  Filled 2019-11-28: qty 10000

## 2019-11-28 MED ORDER — ACETAMINOPHEN 325 MG PO TABS
650.0000 mg | ORAL_TABLET | ORAL | Status: DC | PRN
  Administered 2019-11-28 – 2019-11-29 (×3): 650 mg via ORAL
  Filled 2019-11-28 (×3): qty 2

## 2019-11-28 MED ORDER — ONDANSETRON HCL 4 MG/2ML IJ SOLN: 4.0000 mg | Freq: Four times a day (QID) | INTRAMUSCULAR | Status: DC | PRN

## 2019-11-28 MED ORDER — THROMBIN 5000 UNITS EX SOLR
CUTANEOUS | Status: DC | PRN
  Administered 2019-11-28 (×2): 5000 [IU] via TOPICAL

## 2019-11-28 MED ORDER — ONDANSETRON HCL 4 MG/2ML IJ SOLN
INTRAMUSCULAR | Status: AC
  Filled 2019-11-28: qty 2

## 2019-11-28 MED ORDER — KETAMINE HCL 50 MG/5ML IJ SOSY
PREFILLED_SYRINGE | INTRAMUSCULAR | Status: AC
  Filled 2019-11-28: qty 5

## 2019-11-28 MED ORDER — PROMETHAZINE HCL 25 MG/ML IJ SOLN: 6.2500 mg | INTRAMUSCULAR | Status: DC | PRN

## 2019-11-28 MED ORDER — ASPIRIN EC 81 MG PO TBEC
81.0000 mg | DELAYED_RELEASE_TABLET | Freq: Every day | ORAL | Status: DC
  Administered 2019-11-28: 81 mg via ORAL
  Filled 2019-11-28: qty 1

## 2019-11-28 MED ORDER — HYDROMORPHONE HCL 1 MG/ML IJ SOLN
1.0000 mg | INTRAMUSCULAR | Status: DC | PRN
  Administered 2019-11-28: 1 mg via INTRAVENOUS
  Filled 2019-11-28: qty 1

## 2019-11-28 MED ORDER — MIDAZOLAM HCL 2 MG/2ML IJ SOLN
INTRAMUSCULAR | Status: DC | PRN
  Administered 2019-11-28 (×2): 1 mg via INTRAVENOUS

## 2019-11-28 MED ORDER — PHENYLEPHRINE HCL-NACL 10-0.9 MG/250ML-% IV SOLN
INTRAVENOUS | Status: DC | PRN
  Administered 2019-11-28: 10 ug/min via INTRAVENOUS

## 2019-11-28 MED ORDER — HYDROMORPHONE HCL 1 MG/ML IJ SOLN
0.5000 mg | INTRAMUSCULAR | Status: DC | PRN
  Administered 2019-11-28 (×2): 0.5 mg via INTRAVENOUS

## 2019-11-28 MED ORDER — FENTANYL CITRATE (PF) 100 MCG/2ML IJ SOLN
25.0000 ug | INTRAMUSCULAR | Status: DC | PRN
  Administered 2019-11-28 (×3): 50 ug via INTRAVENOUS

## 2019-11-28 MED ORDER — CYCLOBENZAPRINE HCL 10 MG PO TABS
ORAL_TABLET | ORAL | Status: AC
  Filled 2019-11-28: qty 1

## 2019-11-28 MED ORDER — 0.9 % SODIUM CHLORIDE (POUR BTL) OPTIME
TOPICAL | Status: DC | PRN
  Administered 2019-11-28: 1000 mL

## 2019-11-28 MED ORDER — SODIUM CHLORIDE 0.9% FLUSH: 3.0000 mL | Freq: Two times a day (BID) | INTRAVENOUS | Status: DC

## 2019-11-28 MED ORDER — CYCLOBENZAPRINE HCL 10 MG PO TABS
10.0000 mg | ORAL_TABLET | Freq: Three times a day (TID) | ORAL | Status: DC | PRN
  Administered 2019-11-28: 10 mg via ORAL

## 2019-11-28 MED ORDER — SODIUM CHLORIDE 0.9 % IV SOLN: INTRAVENOUS | Status: DC | PRN

## 2019-11-28 MED ORDER — INSULIN ASPART 100 UNIT/ML ~~LOC~~ SOLN: 0.0000 [IU] | Freq: Three times a day (TID) | SUBCUTANEOUS | Status: DC

## 2019-11-28 MED ORDER — MENTHOL 3 MG MT LOZG
1.0000 | LOZENGE | OROMUCOSAL | Status: DC | PRN
  Administered 2019-11-28: 3 mg via ORAL
  Filled 2019-11-28: qty 9

## 2019-11-28 MED ORDER — ROCURONIUM BROMIDE 10 MG/ML (PF) SYRINGE
PREFILLED_SYRINGE | INTRAVENOUS | Status: DC | PRN
  Administered 2019-11-28: 100 mg via INTRAVENOUS

## 2019-11-28 MED ORDER — PROPRANOLOL HCL 10 MG PO TABS
10.0000 mg | ORAL_TABLET | Freq: Every day | ORAL | Status: DC | PRN
  Filled 2019-11-28: qty 1

## 2019-11-28 MED ORDER — PROPOFOL 10 MG/ML IV BOLUS
INTRAVENOUS | Status: DC | PRN
  Administered 2019-11-28: 200 mg via INTRAVENOUS

## 2019-11-28 MED ORDER — LACTATED RINGERS IV SOLN: INTRAVENOUS | Status: DC

## 2019-11-28 MED ORDER — CEFAZOLIN SODIUM-DEXTROSE 2-4 GM/100ML-% IV SOLN
INTRAVENOUS | Status: AC
  Filled 2019-11-28: qty 100

## 2019-11-28 MED ORDER — HYDROMORPHONE HCL 1 MG/ML IJ SOLN
INTRAMUSCULAR | Status: AC
  Filled 2019-11-28: qty 1

## 2019-11-28 MED ORDER — MIDAZOLAM HCL 2 MG/2ML IJ SOLN
INTRAMUSCULAR | Status: AC
  Filled 2019-11-28: qty 2

## 2019-11-28 MED ORDER — KETAMINE HCL 10 MG/ML IJ SOLN
INTRAMUSCULAR | Status: DC | PRN
  Administered 2019-11-28: 10 mg via INTRAVENOUS
  Administered 2019-11-28: 40 mg via INTRAVENOUS

## 2019-11-28 MED ORDER — INSULIN ASPART 100 UNIT/ML ~~LOC~~ SOLN
0.0000 [IU] | Freq: Every day | SUBCUTANEOUS | Status: DC
  Administered 2019-11-28: 2 [IU] via SUBCUTANEOUS

## 2019-11-28 MED ORDER — CEFAZOLIN SODIUM-DEXTROSE 1-4 GM/50ML-% IV SOLN: 1.0000 g | Freq: Three times a day (TID) | INTRAVENOUS | Status: DC

## 2019-11-28 MED ORDER — ESMOLOL HCL 100 MG/10ML IV SOLN
INTRAVENOUS | Status: AC
  Filled 2019-11-28: qty 10

## 2019-11-28 MED ORDER — ZOLPIDEM TARTRATE 5 MG PO TABS: 5.0000 mg | ORAL_TABLET | Freq: Every evening | ORAL | Status: DC | PRN

## 2019-11-28 MED ORDER — CEFAZOLIN SODIUM-DEXTROSE 2-4 GM/100ML-% IV SOLN
2.0000 g | INTRAVENOUS | Status: AC
  Administered 2019-11-28: 2 g via INTRAVENOUS

## 2019-11-28 MED ORDER — CHLORHEXIDINE GLUCONATE CLOTH 2 % EX PADS: 6.0000 | MEDICATED_PAD | Freq: Once | CUTANEOUS | Status: DC

## 2019-11-28 MED ORDER — ONDANSETRON HCL 4 MG PO TABS: 4.0000 mg | ORAL_TABLET | Freq: Four times a day (QID) | ORAL | Status: DC | PRN

## 2019-11-28 MED ORDER — CELECOXIB 200 MG PO CAPS
ORAL_CAPSULE | ORAL | Status: AC
  Administered 2019-11-28: 200 mg via ORAL
  Filled 2019-11-28: qty 1

## 2019-11-28 MED ORDER — ROCURONIUM BROMIDE 10 MG/ML (PF) SYRINGE
PREFILLED_SYRINGE | INTRAVENOUS | Status: AC
  Filled 2019-11-28: qty 10

## 2019-11-28 MED ORDER — HYDROMORPHONE HCL 2 MG PO TABS
1.0000 mg | ORAL_TABLET | Freq: Four times a day (QID) | ORAL | Status: DC | PRN
  Administered 2019-11-28 – 2019-11-29 (×3): 2 mg via ORAL
  Filled 2019-11-28 (×3): qty 1

## 2019-11-28 MED ORDER — EZETIMIBE 10 MG PO TABS
10.0000 mg | ORAL_TABLET | Freq: Every evening | ORAL | Status: DC
  Administered 2019-11-28: 10 mg via ORAL
  Filled 2019-11-28: qty 1

## 2019-11-28 MED ORDER — FENTANYL CITRATE (PF) 250 MCG/5ML IJ SOLN
INTRAMUSCULAR | Status: AC
  Filled 2019-11-28: qty 5

## 2019-11-28 MED ORDER — ACETAMINOPHEN 500 MG PO TABS
ORAL_TABLET | ORAL | Status: AC
  Administered 2019-11-28: 1000 mg via ORAL
  Filled 2019-11-28: qty 2

## 2019-11-28 MED ORDER — PHENOL 1.4 % MT LIQD
1.0000 | OROMUCOSAL | Status: DC | PRN
  Administered 2019-11-28: 1 via OROMUCOSAL
  Filled 2019-11-28: qty 177

## 2019-11-28 MED ORDER — CELECOXIB 200 MG PO CAPS: 200.0000 mg | ORAL_CAPSULE | Freq: Once | ORAL | Status: AC

## 2019-11-28 MED ORDER — HYDROMORPHONE HCL 1 MG/ML IJ SOLN
0.5000 mg | INTRAMUSCULAR | Status: DC | PRN
  Administered 2019-11-28: 0.5 mg via INTRAVENOUS

## 2019-11-28 MED ORDER — PROPOFOL 10 MG/ML IV BOLUS
INTRAVENOUS | Status: AC
  Filled 2019-11-28: qty 20

## 2019-11-28 MED ORDER — LISINOPRIL 20 MG PO TABS
20.0000 mg | ORAL_TABLET | Freq: Every evening | ORAL | Status: DC
  Administered 2019-11-28: 20 mg via ORAL
  Filled 2019-11-28: qty 1

## 2019-11-28 MED ORDER — LIDOCAINE 2% (20 MG/ML) 5 ML SYRINGE
INTRAMUSCULAR | Status: AC
  Filled 2019-11-28: qty 5

## 2019-11-28 SURGICAL SUPPLY — 49 items
BAG DECANTER FOR FLEXI CONT (MISCELLANEOUS) ×2 IMPLANT
BENZOIN TINCTURE PRP APPL 2/3 (GAUZE/BANDAGES/DRESSINGS) ×2 IMPLANT
BIT DRILL 13 (BIT) ×2 IMPLANT
BUR MATCHSTICK NEURO 3.0 LAGG (BURR) ×4 IMPLANT
CAGE PEEK 8X14X11 (Cage) ×2 IMPLANT
CANISTER SUCT 3000ML PPV (MISCELLANEOUS) ×2 IMPLANT
CARTRIDGE OIL MAESTRO DRILL (MISCELLANEOUS) ×1 IMPLANT
DIFFUSER DRILL AIR PNEUMATIC (MISCELLANEOUS) ×2 IMPLANT
DRAPE C-ARM 42X72 X-RAY (DRAPES) ×4 IMPLANT
DRAPE LAPAROTOMY 100X72 PEDS (DRAPES) ×2 IMPLANT
DRAPE MICROSCOPE LEICA (MISCELLANEOUS) ×2 IMPLANT
DURAPREP 6ML APPLICATOR 50/CS (WOUND CARE) ×2 IMPLANT
ELECT COATED BLADE 2.86 ST (ELECTRODE) ×2 IMPLANT
ELECT REM PT RETURN 9FT ADLT (ELECTROSURGICAL) ×2
ELECTRODE REM PT RTRN 9FT ADLT (ELECTROSURGICAL) ×1 IMPLANT
GAUZE 4X4 16PLY RFD (DISPOSABLE) IMPLANT
GAUZE SPONGE 4X4 12PLY STRL (GAUZE/BANDAGES/DRESSINGS) ×2 IMPLANT
GLOVE BIO SURGEON STRL SZ 6.5 (GLOVE) ×4 IMPLANT
GLOVE BIOGEL PI IND STRL 6.5 (GLOVE) ×1 IMPLANT
GLOVE BIOGEL PI IND STRL 7.0 (GLOVE) ×3 IMPLANT
GLOVE BIOGEL PI INDICATOR 6.5 (GLOVE) ×1
GLOVE BIOGEL PI INDICATOR 7.0 (GLOVE) ×3
GLOVE ECLIPSE 9.0 STRL (GLOVE) ×2 IMPLANT
GOWN STRL REUS W/ TWL LRG LVL3 (GOWN DISPOSABLE) ×3 IMPLANT
GOWN STRL REUS W/ TWL XL LVL3 (GOWN DISPOSABLE) ×2 IMPLANT
GOWN STRL REUS W/TWL LRG LVL3 (GOWN DISPOSABLE) ×6
GOWN STRL REUS W/TWL XL LVL3 (GOWN DISPOSABLE) ×4
HALTER HD/CHIN CERV TRACTION D (MISCELLANEOUS) ×2 IMPLANT
KIT BASIN OR (CUSTOM PROCEDURE TRAY) ×2 IMPLANT
KIT TURNOVER KIT B (KITS) ×2 IMPLANT
NEEDLE SPNL 20GX3.5 QUINCKE YW (NEEDLE) ×2 IMPLANT
NS IRRIG 1000ML POUR BTL (IV SOLUTION) ×2 IMPLANT
OIL CARTRIDGE MAESTRO DRILL (MISCELLANEOUS) ×2
PACK LAMINECTOMY NEURO (CUSTOM PROCEDURE TRAY) ×2 IMPLANT
PLATE ELITE VISION 25MM (Plate) ×2 IMPLANT
RASP 3.0MM (RASP) ×2 IMPLANT
RUBBERBAND STERILE (MISCELLANEOUS) ×4 IMPLANT
SCREW ST 13X4XST VA NS SPNE (Screw) ×4 IMPLANT
SCREW ST VAR 4 ATL (Screw) ×8 IMPLANT
SPONGE INTESTINAL PEANUT (DISPOSABLE) ×2 IMPLANT
SPONGE SURGIFOAM ABS GEL SZ50 (HEMOSTASIS) ×2 IMPLANT
STRIP CLOSURE SKIN 1/2X4 (GAUZE/BANDAGES/DRESSINGS) ×2 IMPLANT
SUT VIC AB 3-0 SH 8-18 (SUTURE) ×2 IMPLANT
SUT VIC AB 4-0 RB1 18 (SUTURE) ×2 IMPLANT
TAPE CLOTH SURG 4X10 WHT LF (GAUZE/BANDAGES/DRESSINGS) ×2 IMPLANT
TOWEL GREEN STERILE (TOWEL DISPOSABLE) ×2 IMPLANT
TOWEL GREEN STERILE FF (TOWEL DISPOSABLE) ×2 IMPLANT
TRAP SPECIMEN MUCOUS 40CC (MISCELLANEOUS) ×2 IMPLANT
WATER STERILE IRR 1000ML POUR (IV SOLUTION) ×2 IMPLANT

## 2019-11-28 NOTE — Transfer of Care (Signed)
Immediate Anesthesia Transfer of Care Note  Patient: Miguel Miller  Procedure(s) Performed: Anterior Cervical Decompression Fusion - Cervical Three - Cervical Four (N/A Spine Cervical)  Patient Location: PACU  Anesthesia Type:General  Level of Consciousness: awake, drowsy and patient cooperative  Airway & Oxygen Therapy: Patient Spontanous Breathing and Patient connected to face mask oxygen  Post-op Assessment: Report given to RN and Post -op Vital signs reviewed and stable  Post vital signs: Reviewed and stable  Last Vitals:  Vitals Value Taken Time  BP 177/89 11/28/19 1452  Temp    Pulse 67 11/28/19 1453  Resp 10 11/28/19 1453  SpO2 99 % 11/28/19 1453  Vitals shown include unvalidated device data.  Last Pain:  Vitals:   11/28/19 1111  TempSrc:   PainSc: 5          Complications: No apparent anesthesia complications

## 2019-11-28 NOTE — Progress Notes (Signed)
Orthopedic Tech Progress Note Patient Details:  Miguel Miller 24-Oct-1940 [id number]  Ortho Devices Type of Ortho Device: Soft collar Ortho Device/Splint Interventions: Application   Post Interventions Patient Tolerated: Well Instructions Provided: Care of device   [place] Pink 11/28/2019, 3:20 PM

## 2019-11-28 NOTE — Progress Notes (Deleted)
Date of procedure: 11/28/2019  Date of dictation: Same  Service: Neurosurgery  Preoperative diagnosis: Cervical stenosis with myelopathy  Postoperative diagnosis: Same  Procedure Name: C3-C4 anterior cervical discectomy with interbody fusion utilizing interbody peek cage, locally harvested autograft, and anterior plate instrumentation  Removal of C4-C6 anterior cervical plate  Surgeon:Rowyn Spilde A.Cheetara Hoge, M.D.  Asst. Surgeon: Reinaldo Meeker, NP  Anesthesia: General  Indication: 79 year old male status post prior C4-C6 anterior cervical decompression and fusion approximately 20 years ago with good results.  Patient presents now with worsening neck and bilateral upper extremity numbness weakness and symptoms of spasticity consistent with cervical myelopathy.  Work-up demonstrates evidence of a large central disc herniation with severe stenosis at C3-4.  Patient presents now for anterior cervical decompression and fusion in hopes of improving his symptoms.  Operative note: After induction of anesthesia, patient position supine with neck slightly extended and held placed halter traction.  Patient's anterior cervical region prepped and draped sterilely.  Incision made overlying C4-5.  Dissection performed on the right.  Retractor placed.  Fluoroscopy used.  Level confirmed.  Previously placed anterior cervical plate was dissected free.  This was disassembled however the inferior most C6 screw on the right was stripped.  This had to be partially removed using a metal cutting bit.  The plate was removed.  The residual screw was drilled flat to the vertebral body and left in place.  Attention placed to the C3-4 level.  Anterior osteophyte was resected.  Disc base was incised.  Discectomy then performed using various instruments down to level the posterior annulus.  Microscope then brought into the field used at the remainder of the discectomy.  Remaining aspects of annulus and osteophytes removed using high-speed  drill down to level of the posterior longitudinal ligament.  Posterior margin was then elevated and resected piecemeal fashion.  Underlying thecal sac was identified.  Wide central decompression then performed undercutting the bodies of C3 and C4.  Decompression then proceeded into each neural foramina.  Wide anterior foraminotomies were performed locally along the course exiting C4 nerve roots bilaterally.  At this point a very thorough decompression been achieved.  There was no evidence of injury to the thecal sac or nerve roots.  Gelfoam placed topically for hemostasis then removed.  8 mm Medtronic anterior cervical peek cage was packed with locally harvested autograft and then impacted in place where it was recessed slightly from the anterior cortical margin.  25 mm Atlantis anterior cervical plate was then placed over the C3 and C4 levels.  This then attached to the fluoroscopic guidance using 13 mm variable angle screws to each of both levels.  All 4 screws given final tightening.  Final images reveal good position of cages and the hardware at the proper upper level with normal alignment of spine.  Wound is then irrigated one final time.  A medium Hemovac drain was left in the prevertebral space.  Wounds and closed in layers of Vicryl sutures.  Steri-Strips and sterile dressing were applied.  No apparent complications.  Patient tolerated the procedure well and he returned to the recovery room postop.

## 2019-11-28 NOTE — Op Note (Signed)
Date of procedure: 11/28/2019  Date of dictation: Same  Service: Neurosurgery  Preoperative diagnosis: Cervical stenosis with myelopathy  Postoperative diagnosis: Same  Procedure Name: C3-C4 anterior cervical discectomy with interbody fusion utilizing interbody peek cage, locally harvested autograft, and anterior plate instrumentation  Removal of C4-C6 anterior cervical plate  Surgeon:Shaurya Rawdon A.Andera Cranmer, M.D.  Asst. Surgeon: Reinaldo Meeker, NP  Anesthesia: General  Indication: 79 year old male status post prior C4-C6 anterior cervical decompression and fusion approximately 20 years ago with good results.  Patient presents now with worsening neck and bilateral upper extremity numbness weakness and symptoms of spasticity consistent with cervical myelopathy.  Work-up demonstrates evidence of a large central disc herniation with severe stenosis at C3-4.  Patient presents now for anterior cervical decompression and fusion in hopes of improving his symptoms.  Operative note: After induction of anesthesia, patient position supine with neck slightly extended and held placed halter traction.  Patient's anterior cervical region prepped and draped sterilely.  Incision made overlying C4-5.  Dissection performed on the right.  Retractor placed.  Fluoroscopy used.  Level confirmed.  Previously placed anterior cervical plate was dissected free.  This was disassembled however the inferior most C6 screw on the right was stripped.  This had to be partially removed using a metal cutting bit.  The plate was removed.  The residual screw was drilled flat to the vertebral body and left in place.  Attention placed to the C3-4 level.  Anterior osteophyte was resected.  Disc base was incised.  Discectomy then performed using various instruments down to level the posterior annulus.  Microscope then brought into the field used at the remainder of the discectomy.  Remaining aspects of annulus and osteophytes removed using  high-speed drill down to level of the posterior longitudinal ligament.  Posterior margin was then elevated and resected piecemeal fashion.  Underlying thecal sac was identified.  Wide central decompression then performed undercutting the bodies of C3 and C4.  Decompression then proceeded into each neural foramina.  Wide anterior foraminotomies were performed locally along the course exiting C4 nerve roots bilaterally.  At this point a very thorough decompression been achieved.  There was no evidence of injury to the thecal sac or nerve roots.  Gelfoam placed topically for hemostasis then removed.  8 mm Medtronic anterior cervical peek cage was packed with locally harvested autograft and then impacted in place where it was recessed slightly from the anterior cortical margin.  25 mm Atlantis anterior cervical plate was then placed over the C3 and C4 levels.  This then attached to the fluoroscopic guidance using 13 mm variable angle screws to each of both levels.  All 4 screws given final tightening.  Final images reveal good position of cages and the hardware at the proper upper level with normal alignment of spine.  Wound is then irrigated one final time.  A medium Hemovac drain was left in the prevertebral space.  Wounds and closed in layers of Vicryl sutures.  Steri-Strips and sterile dressing were applied.  No apparent complications.  Patient tolerated the procedure well and he returned to the recovery room postop.

## 2019-11-28 NOTE — Anesthesia Procedure Notes (Signed)
Procedure Name: Intubation Date/Time: 11/28/2019 12:51 PM Performed by: Lowella Dell, CRNA Pre-anesthesia Checklist: Patient identified, Emergency Drugs available, Suction available and Patient being monitored Patient Re-evaluated:Patient Re-evaluated prior to induction Oxygen Delivery Method: Circle System Utilized Preoxygenation: Pre-oxygenation with 100% oxygen Induction Type: IV induction Ventilation: Mask ventilation without difficulty and Oral airway inserted - appropriate to patient size Laryngoscope Size: Glidescope and 4 (elective d/t neck discomfort with movement) Grade View: Grade I Tube type: Oral Tube size: 7.5 mm Number of attempts: 1 Airway Equipment and Method: Oral airway,  Video-laryngoscopy and Rigid stylet Placement Confirmation: ETT inserted through vocal cords under direct vision,  positive ETCO2 and breath sounds checked- equal and bilateral Secured at: 22 cm Tube secured with: Tape Dental Injury: Teeth and Oropharynx as per pre-operative assessment

## 2019-11-28 NOTE — Progress Notes (Signed)
Inpatient Diabetes Program Recommendations  AACE/ADA: New Consensus Statement on Inpatient Glycemic Control (2015)  Target Ranges:  Prepandial:   less than 140 mg/dL      Peak postprandial:   less than 180 mg/dL (1-2 hours)      Critically ill patients:  140 - 180 mg/dL   Lab Results  Component Value Date   GLUCAP 137 (H) 11/28/2019   HGBA1C 7.7 (H) 11/24/2019   Results for MAHLON, VIENNEAU (MRN [id number]) as of 11/28/2019 12:26  Ref. Range 11/28/2019 10:40 11/28/2019 12:14  Glucose-Capillary Latest Ref Range: 70 - 99 mg/dL 150 (H) 137 (H)   A1c 7.7% increased from 6.8% before the Holidays.  Pt had watched his portion sizes 5 years ago and was able to control his DM through diet alone. Pt had been on metformin. Pt reports has not been paying attention to his eating habits and not watching his portion sizes. Pt also reports having many birthdays and celebrations in the last 3 months.  Pt also reports many stressors that has happened recently. Since his Preop appt pt has been checking his glucose and starting back on portion control. Glucose trends today improved. Spoke with pt and talked through some of the stressors he has had lately and for him to continue to monitor his levels and follow up with his PCP. Pt has had an Endocrinologist in the past.  Thanks,  Tama Headings RN, MSN, BC-ADM Inpatient Diabetes Coordinator Team Pager [phone] (8a-5p)

## 2019-11-28 NOTE — H&P (Signed)
Miguel Miller is an 79 y.o. male.   Chief Complaint: Neck pain and weakness. HPI: Patient is a 79 year old male status post remote C4-C6 anterior cervical decompression and fusion.  Patient presents now with worsening neck and bilateral upper extremity numbness with increasing difficulty with fine motor control and weakness in his hands.  Patient also notes some instability and incoordination with walking.  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  Patient presents now for anterior cervical decompression and fusion in hopes of improving his symptoms.  Past Medical History:  Diagnosis Date  . Actinic keratoses   . Arteriosclerosis   . BPH (benign prostatic hyperplasia)   . Diabetes mellitus type 2, controlled, without complications ([place])    diet controlled  . Diverticulosis   . DJD (degenerative joint disease)   . Dyslipidemia   . Eczema   . ED (erectile dysfunction)   . Essential tremor   . GERD (gastroesophageal reflux disease)   . Glaucoma    h/o retineal tear; pt denies glaucoma(09/29/16)  . Hypertension   . Low testosterone   . Peripheral vascular disease ([place])   . Prostate cancer ([place]) 11/2019  . Thrombocytopenia ([place])   . Thyroid goiter     Past Surgical History:  Procedure Laterality Date  . BACK SURGERY     LUMBAR  2-3   03/2011  . BACK SURGERY     Lami/microdiscectomy 02/13/18 - Dr. Earnie Larsson  . BIOPSY THYROID    . c-spine  1999   Dr. Annette Stable  . CATARACT EXTRACTION  1997  . CATARACT EXTRACTION     LEFT   1998  . fusion L2  2012   Dr. Annette Stable  . HEMORRHOIDECTOMY WITH HEMORRHOID BANDING    . KNEE SURGERY Bilateral 2005   Dr. Berenice Primas  . l-spine  2003   Dr. Annette Stable  . laser surgery for holes in retina Right 08/2012  . outpatient right shoulder  2008   Dr. Percell Miller  . PROSTATE SURGERY     09/2016  LASER  . PYLOROMYOTOMY     1944  . RETINAL DETACHMENT SURGERY  1996   Dr. Glennon Mac  .  retineal tear  11/2012   Dr. Darrick Grinder  . SPINAL FUSION     Pedicle screw and interbody fusion L2-3, L3-4, and L4-5  . TOTAL HIP ARTHROPLASTY Left 06/05/2017  . TOTAL HIP ARTHROPLASTY Left 06/05/2017   Procedure: TOTAL HIP ARTHROPLASTY ANTERIOR APPROACH;  Surgeon: Renette Butters, MD;  Location: [facility];  Service: Orthopedics;  Laterality: Left;  [name] VASECTOMY      Family History  Problem Relation Age of Onset  . Emphysema Father   . Arthritis Father   . Cancer Mother        GYN  . Diabetes Brother   . High blood pressure Brother   . Bladder Cancer Brother   . Heart disease Paternal Grandfather   . Prostate cancer Paternal Grandfather   . Thyroid nodules Neg Hx   . Colon cancer Neg Hx   . Pancreatic cancer Neg Hx   . Breast cancer Neg Hx    Social History:  reports that he has never smoked. He has never used smokeless tobacco. He reports current alcohol use. He reports that he does not use drugs.  Allergies:  Allergies  Allergen Reactions  . Tramadol Other (See Comments)    Hallucinations -He thinks-just remembered had a problem  . Hydrocodone Bitartrate [Hydrocodone] Itching  and Other (See Comments)  . Oxycodone Hives  . Statins Other (See Comments)    Muscle aches   . Welchol [Colesevelam Hcl] Other (See Comments)    constipation    Facility-Administered Medications Prior to Admission  Medication Dose Route Frequency Provider Last Rate Last Admin  . lidocaine (PF) (XYLOCAINE) 1 % injection 10 mL  10 mL Other Once Magnus Sinning, MD       Medications Prior to Admission  Medication Sig Dispense Refill  . acetaminophen (TYLENOL) 500 MG tablet Take 1,000 mg by mouth every 6 (six) hours as needed for moderate pain or headache.    [name] aspirin EC 81 MG tablet Take 81 mg by mouth daily.    . diazepam (VALIUM) 5 MG tablet Take 1 tablet (5 mg total) by mouth every 8 (eight) hours as needed for muscle spasms. Do not take with other muscle spasm medicine or pain medication. 30  tablet 0  . ezetimibe (ZETIA) 10 MG tablet Take 10 mg by mouth every evening.     [name] HYDROmorphone (DILAUDID) 2 MG tablet Take 1-2 mg by mouth every 6 (six) hours as needed for moderate pain.     [name] lisinopril (PRINIVIL,ZESTRIL) 20 MG tablet Take 20 mg by mouth every evening.     . propranolol (INDERAL) 10 MG tablet Take 10 mg by mouth daily as needed (tremors).     . zolpidem (AMBIEN) 10 MG tablet Take 10 mg by mouth at bedtime as needed for sleep.    [name] aspirin EC 325 MG tablet Take 1 tablet (325 mg total) by mouth daily. For 30 days post op for DVT Prophylaxis (Patient not taking: Reported on 11/20/2019) 30 tablet 0  . docusate sodium (COLACE) 100 MG capsule Take 1 capsule (100 mg total) by mouth 2 (two) times daily. To prevent constipation while taking pain medication. (Patient not taking: Reported on 11/20/2019) 60 capsule 0  . omeprazole (PRILOSEC) 20 MG capsule Take 1 capsule (20 mg total) by mouth daily. 30 days for gastroprotection while taking Aspirin. (Patient not taking: Reported on 11/04/2019) 30 capsule 0  . pregabalin (LYRICA) 75 MG capsule 1 capsule at night for 7 nights, then 1 capsule 2 times a day (Patient not taking: Reported on 11/04/2019) 60 capsule 2    Results for orders placed or performed during the hospital encounter of 11/28/19 (from the past 48 hour(s))  Glucose, capillary     Status: Abnormal   Collection Time: 11/28/19 10:40 AM  Result Value Ref Range   Glucose-Capillary 150 (H) 70 - 99 mg/dL    Comment: Glucose reference range applies only to samples taken after fasting for at least 8 hours.  Glucose, capillary     Status: Abnormal   Collection Time: 11/28/19 12:14 PM  Result Value Ref Range   Glucose-Capillary 137 (H) 70 - 99 mg/dL    Comment: Glucose reference range applies only to samples taken after fasting for at least 8 hours.   No results found.  Pertinent items noted in HPI and remainder of comprehensive ROS otherwise negative.  Blood pressure (!) 149/76,  pulse 87, temperature 98.6 F (37 C), temperature source Oral, resp. rate 18, height 5\' 8"  (1.727 m), weight 104.3 kg, SpO2 98 %.  Patient is awake and alert.  He is oriented and appropriate.  Speech is fluent.  Judgment insight are intact.  Cranial nerve function normal bilateral.  Motor examination extremities reveals weakness in his hands with 4/5 grip strength and 4-/5 intrinsic strength.  He has some diffuse weakness in both lower extremities.  He has some spasticity in both lower extremities.  Reflexes are normal active.  No definite long track signs.  Examination head ears eyes nose throat is unremarked.  Chest and abdomen are benign.  Extremities are free from injury or deformity. Assessment/Plan C34 stenosis with myelopathy.  Plan C3-4 anterior cervical discectomy and fusion with interbody cage, local harvested autograft, and anterior plate instrumentation.  Risks and benefits of been explained.  Patient wishes to proceed.  Cooper Render Mattox Schorr 11/28/2019, 12:31 PM

## 2019-11-28 NOTE — Anesthesia Postprocedure Evaluation (Signed)
Anesthesia Post Note  Patient: Miguel Miller  Procedure(s) Performed: Anterior Cervical Decompression Fusion - Cervical Three - Cervical Four (N/A Spine Cervical)     Patient location during evaluation: PACU Anesthesia Type: General Level of consciousness: sedated Pain management: pain level controlled Vital Signs Assessment: post-procedure vital signs reviewed and stable Respiratory status: spontaneous breathing and respiratory function stable Cardiovascular status: stable Postop Assessment: no apparent nausea or vomiting Anesthetic complications: no    Last Vitals:  Vitals:   11/28/19 1551 11/28/19 1607  BP: (!) 168/91 (!) 176/78  Pulse: (!) 58 64  Resp: (!) 6 16  Temp:    SpO2: 98% 99%                   Buryl Bamber DANIEL

## 2019-11-29 DIAGNOSIS — M4802 Spinal stenosis, cervical region: Secondary | ICD-10-CM | POA: Diagnosis not present

## 2019-11-29 LAB — GLUCOSE, CAPILLARY: Glucose-Capillary: 160 mg/dL — ABNORMAL HIGH (ref 70–99)

## 2019-11-29 NOTE — Discharge Instructions (Addendum)
Tama Headings RN, MSN, BC-ADM Inpatient Diabetes Coordinator [phone]  Wound Care Keep incision area dry.  You may remove outer bandage after 2 days and shower.   If you shower prior cover incision with plastic wrap.  Do not put any creams, lotions, or ointments on incision. Leave steri-strips on neck.  They will fall off by themselves. Activity Walk each and every day, increasing distance each day. No lifting greater than 5 lbs.  Avoid excessive neck motion. No driving for 2 weeks; may ride as a passenger locally. Wear neck brace at all times except when showering. Diet Resume your normal diet.  Return to Work Will be discussed at you follow up appointment. Call Your Doctor If Any of These Occur Redness, drainage, or swelling at the wound.  Temperature greater than 101 degrees. Severe pain not relieved by pain medication. Increased difficulty swallowing.  Incision starts to come apart. Follow Up Appt Call today and ask for Wells Guiles for appointment in 1-2 weeks ([phone]) or for problems.  If you have any hardware placed in your spine, you will need an x-ray before your appointment.

## 2019-11-29 NOTE — Evaluation (Signed)
Occupational Therapy Evaluation Patient Details Name: Miguel Miller MRN: [id number] DOB: [birth year] Today's Date: 11/29/2019    History of Present Illness Pt is a 79 yr old male who had s/p C3-C4 anterior cervical discectomy with interbody fusion utilizing interbody peek cage. PMH:  DJD, total hip sx, back sx, DM prostate ca   Clinical Impression   Patient is s/p see above surgery resulting in the deficits listed below (see OT Problem List).  Pt was re educated about precautions and required cues in session. Pt required supervision with UE/LE dressing and functional mobility with no assisted device as required cues to decrease in neck rotation. Pt educated on how to use AE in showers and in home to decrease risk of injury.     Follow Up Recommendations  No OT follow up;Supervision - Intermittent    Equipment Recommendations  Other (comment)(reacher and long handle sponge)    Recommendations for Other Services       Precautions / Restrictions Precautions Precautions: Cervical Precaution Booklet Issued: No Precaution Comments: reviewed precations with pt  Required Braces or Orthoses: Cervical Brace Cervical Brace: Soft collar Restrictions Weight Bearing Restrictions: No      Mobility Bed Mobility Overal bed mobility: Modified Independent             General bed mobility comments: HOB slightly elvated  Transfers Overall transfer level: Modified independent                    Balance Overall balance assessment: Mild deficits observed, not formally tested                                         ADL either performed or assessed with clinical judgement   ADL Overall ADL's : Needs assistance/impaired Eating/Feeding: Independent;Sitting   Grooming: Wash/dry hands;Wash/dry face;Standing;Supervision/safety   Upper Body Bathing: Supervision/ safety;Standing;Sitting;Cueing for safety;Cueing for sequencing   Lower Body Bathing: Supervison/  safety;Cueing for safety;Cueing for sequencing;Sit to/from stand   Upper Body Dressing : Supervision/safety;Cueing for safety;Cueing for sequencing;Sitting;Standing   Lower Body Dressing: Supervision/safety;Cueing for safety;Cueing for sequencing;Sit to/from stand   Toilet Transfer: Supervision/safety;Cueing for safety;Cueing for sequencing   Toileting- Clothing Manipulation and Hygiene: Supervision/safety;Cueing for safety;Cueing for sequencing   Tub/ Shower Transfer: Walk-in shower;Supervision/safety   Functional mobility during ADLs: Supervision/safety;Cueing for safety;Cueing for sequencing General ADL Comments: pt requires cues to decrease neck rotation      Vision Baseline Vision/History: Wears glasses Wears Glasses: Reading only Patient Visual Report: No change from baseline Vision Assessment?: No apparent visual deficits     Perception Perception Perception Tested?: No   Praxis Praxis Praxis tested?: Within functional limits    Pertinent Vitals/Pain Pain Assessment: 0-10 Pain Score: 3  Pain Location: nack and R arm Pain Descriptors / Indicators: Tingling Pain Intervention(s): Monitored during session     Hand Dominance Right   Extremity/Trunk Assessment Upper Extremity Assessment Upper Extremity Assessment: Overall WFL for tasks assessed   Lower Extremity Assessment Lower Extremity Assessment: Overall WFL for tasks assessed   Cervical / Trunk Assessment Cervical / Trunk Assessment: Other exceptions(s/p sx)   Communication Communication Communication: No difficulties   Cognition Arousal/Alertness: Awake/alert Behavior During Therapy: WFL for tasks assessed/performed Overall Cognitive Status: Within Functional Limits for tasks assessed  General Comments       Exercises     Shoulder Instructions      Home Living Family/patient expects to be discharged to:: Private residence Living Arrangements:  Spouse/significant other Available Help at Discharge: Family Type of Home: House Home Access: Level entry     Home Layout: One level     Bathroom Shower/Tub: Occupational psychologist: Standard Bathroom Accessibility: Yes How Accessible: Accessible via walker            Prior Functioning/Environment Level of Independence: Independent(with pain)                 OT Problem List: Decreased strength;Decreased activity tolerance;Decreased safety awareness;Decreased knowledge of use of DME or AE;Pain      OT Treatment/Interventions:      OT Goals(Current goals can be found in the care plan section) Acute Rehab OT Goals Patient Stated Goal: To go back to outdoor activty OT Goal Formulation: With patient Time For Goal Achievement: 12/13/19 Potential to Achieve Goals: Good  OT Frequency:     Barriers to D/C:            Co-evaluation              AM-PAC OT "6 Clicks" Daily Activity     Outcome Measure Help from another person eating meals?: None Help from another person taking care of personal grooming?: None Help from another person toileting, which includes using toliet, bedpan, or urinal?: None Help from another person bathing (including washing, rinsing, drying)?: None Help from another person to put on and taking off regular upper body clothing?: A Little Help from another person to put on and taking off regular lower body clothing?: None 6 Click Score: 23   End of Session Equipment Utilized During Treatment: Gait belt;Cervical collar  Activity Tolerance: No increased pain;Patient tolerated treatment well Patient left: in chair;with call bell/phone within reach  OT Visit Diagnosis: Unsteadiness on feet (R26.81);Muscle weakness (generalized) (M62.81);Pain Pain - part of body: (cervical)                Time: [id number] OT Time Calculation (min): 21 min Charges:  OT General Charges $OT Visit: 1 Visit  Joeseph Amor OTR/L  Acute Rehab Services   [phone] office number [phone] pager number   Joeseph Amor 11/29/2019, 8:46 AM

## 2019-11-29 NOTE — Progress Notes (Signed)
Patient is discharged from room 3C04 at this time. Alert and in stable condition. IV site d/c'd and instructions read to patient with understanding verbalized and all questions answered. Ambulate out of unit with all belongings at side.

## 2019-11-29 NOTE — Discharge Summary (Signed)
Physician Discharge Summary    Providing Compassionate, Quality Care - Together   Patient ID: Miguel Miller MRN: [id number] DOB/AGE: 77-01-1941 77 y.o.  Admit date: 11/28/2019 Discharge date: 11/29/2019  Admission Diagnoses: Cervical myelopathy  Discharge Diagnoses:  Active Problems:   Cervical myelopathy [facility])   Discharged Condition: good  Hospital Course: Patient was admitted to 3C04 following C3-4 ACDF. His postoperative course has be uncomplicated. His Hemovac drain was removed this morning. He has ambulated with therapies. His pain is well-controlled with oral medication, which he reports he already has at home. He is ready for discharge home.  Consults: rehabilitation medicine  Significant Diagnostic Studies: radiology: DG Cervical Spine 1 View  Result Date: 11/28/2019 CLINICAL DATA:  C3-4 fusion. EXAM: DG C-ARM 1-60 MIN; DG CERVICAL SPINE - 1 VIEW CONTRAST:  None. FLUOROSCOPY TIME:  Fluoroscopy Time:  1 second. Number of Acquired Spot Images: 1. COMPARISON:  November 19, 2019. FINDINGS: Single intraoperative fluoroscopic image was obtained of the cervical spine. The patient is status post anterior surgical fusion of C3-4. Good alignment of vertebral bodies is noted. IMPRESSION: Status post anterior surgical fusion of C3-4. Electronically Signed   By: Marijo Conception M.D.   On: 11/28/2019 16:17   DG C-Arm 1-60 Min  Result Date: 11/28/2019 CLINICAL DATA:  C3-4 fusion. EXAM: DG C-ARM 1-60 MIN; DG CERVICAL SPINE - 1 VIEW CONTRAST:  None. FLUOROSCOPY TIME:  Fluoroscopy Time:  1 second. Number of Acquired Spot Images: 1. COMPARISON:  November 19, 2019. FINDINGS: Single intraoperative fluoroscopic image was obtained of the cervical spine. The patient is status post anterior surgical fusion of C3-4. Good alignment of vertebral bodies is noted. IMPRESSION: Status post anterior surgical fusion of C3-4. Electronically Signed   By: Marijo Conception M.D.   On: 11/28/2019 16:17     Treatments:  surgery: C3-C4 anterior cervical discectomy with interbody fusion utilizing interbody peek cage, locally harvested autograft, and anterior plate instrumentation ([employer]). Hardware from previous fusion at C4-6 was removed.  Discharge Exam: Blood pressure 131/70, pulse 67, temperature 98 F (36.7 C), temperature source Oral, resp. rate 16, height 5\' 8"  (1.727 m), weight 104.3 kg, SpO2 97 %.   Alert and oriented x 4 PERRLA CN II-XII grossly intact MAE, Strength and sensation intact Incision is covered with Honeycomb dressing and Steri Strips; Dressing is clean, dry, and intact   Disposition: Discharge disposition: 01-Home or Self Care        Allergies as of 11/29/2019      Reactions   Tramadol Other (See Comments)   Hallucinations -He thinks-just remembered had a problem   Hydrocodone Bitartrate [hydrocodone] Itching, Other (See Comments)   Oxycodone Hives   Statins Other (See Comments)   Muscle aches   Welchol [colesevelam Hcl] Other (See Comments)   constipation      Medication List    STOP taking these medications   omeprazole 20 MG capsule Commonly known as: PriLOSEC   pregabalin 75 MG capsule Commonly known as: Lyrica     TAKE these medications   acetaminophen 500 MG tablet Commonly known as: TYLENOL Take 1,000 mg by mouth every 6 (six) hours as needed for moderate pain or headache.   aspirin EC 81 MG tablet Take 81 mg by mouth daily. What changed: Another medication with the same name was removed. Continue taking this medication, and follow the directions you see here.   diazepam 5 MG tablet Commonly known as: Valium Take 1 tablet (5 mg total) by mouth every  8 (eight) hours as needed for muscle spasms. Do not take with other muscle spasm medicine or pain medication.   docusate sodium 100 MG capsule Commonly known as: Colace Take 1 capsule (100 mg total) by mouth 2 (two) times daily. To prevent constipation while taking pain medication.   HYDROmorphone  2 MG tablet Commonly known as: DILAUDID Take 1-2 mg by mouth every 6 (six) hours as needed for moderate pain.   lisinopril 20 MG tablet Commonly known as: ZESTRIL Take 20 mg by mouth every evening.   propranolol 10 MG tablet Commonly known as: INDERAL Take 10 mg by mouth daily as needed (tremors).   Zetia 10 MG tablet Generic drug: ezetimibe Take 10 mg by mouth every evening.   zolpidem 10 MG tablet Commonly known as: AMBIEN Take 10 mg by mouth at bedtime as needed for sleep.      Follow-up Information    Earnie Larsson, MD. Go in 2 week(s).   Specialty: Neurosurgery Contact information: [address] [place] [address] [phone]        Patricia Nettle, NP Follow up.   Specialty: Neurosurgery Contact information: [address] [place] [address] [phone]           Signed: Patricia Nettle 11/29/2019, 9:34 AM

## 2019-12-01 ENCOUNTER — Encounter: Payer: Self-pay | Admitting: *Deleted

## 2020-01-21 ENCOUNTER — Other Ambulatory Visit: Payer: Self-pay | Admitting: Urology

## 2020-02-25 ENCOUNTER — Telehealth: Payer: Self-pay | Admitting: *Deleted

## 2020-02-25 NOTE — Telephone Encounter (Signed)
CALLED PATIENT TO REMIND OF PRE-SEED APPTS. FOR 02-26-20, LVM FOR A RETURN CALL

## 2020-02-26 ENCOUNTER — Encounter (HOSPITAL_COMMUNITY)
Admission: RE | Admit: 2020-02-26 | Discharge: 2020-02-26 | Disposition: A | Discharge status: Home / Self Care | Payer: Medicare PPO | Source: Ambulatory Visit | Attending: Urology | Admitting: Urology

## 2020-02-26 ENCOUNTER — Encounter: Payer: Self-pay | Admitting: Medical Oncology

## 2020-02-26 ENCOUNTER — Other Ambulatory Visit: Payer: Self-pay

## 2020-02-26 ENCOUNTER — Ambulatory Visit
Admission: RE | Admit: 2020-02-26 | Discharge: 2020-02-26 | Disposition: A | Discharge status: Home / Self Care | Payer: Medicare PPO | Source: Ambulatory Visit | Attending: Urology | Admitting: Urology

## 2020-02-26 ENCOUNTER — Ambulatory Visit (HOSPITAL_COMMUNITY)
Admission: RE | Admit: 2020-02-26 | Discharge: 2020-02-26 | Disposition: A | Discharge status: Home / Self Care | Payer: Medicare PPO | Source: Ambulatory Visit | Attending: Urology | Admitting: Urology

## 2020-02-26 ENCOUNTER — Other Ambulatory Visit: Payer: Self-pay | Admitting: Urology

## 2020-02-26 ENCOUNTER — Ambulatory Visit
Admission: RE | Admit: 2020-02-26 | Discharge: 2020-02-26 | Disposition: A | Discharge status: Home / Self Care | Payer: Medicare PPO | Source: Ambulatory Visit | Attending: Radiation Oncology | Admitting: Radiation Oncology

## 2020-02-26 DIAGNOSIS — C61 Malignant neoplasm of prostate: Secondary | ICD-10-CM | POA: Insufficient documentation

## 2020-02-26 NOTE — Progress Notes (Signed)
Miguel Miller consulted in March of this year and had to delay treatment due to a ruptured discs. He had surgery and has recovered well and now ready to proceed with brachytherapy. Due to time lapse, he has lot of questions regarding procedure and follow up. Questions were addressed by me and Ashlyn, PA. He is scheduled for implants on 7.26. I asked him to call with further questions or concerns. He voiced understanding.

## 2020-02-29 NOTE — Progress Notes (Signed)
  Radiation Oncology         [phone] ________________________________  Name: Miguel Miller MRN: [id number]  Date: 02/26/2020  DOB: 1941-01-28  SIMULATION AND TREATMENT PLANNING NOTE PUBIC ARCH STUDY  [id number], Lennette Bihari, MD  [name], MD  DIAGNOSIS:  Oncology History  Malignant neoplasm of prostate ([place])  10/14/2019 Cancer Staging   Staging form: Prostate, AJCC 8th Edition - Clinical stage from 10/14/2019: Stage IIB (cT1c, cN0, cM0, PSA: 8.4, Grade Group: 2) - Signed by Freeman Caldron, PA-C on 11/04/2019   11/04/2019 Initial Diagnosis   Malignant neoplasm of prostate ([place])       ICD-10-CM   1. Malignant neoplasm of prostate ([place])  C61     COMPLEX SIMULATION:  The patient presented today for evaluation for possible prostate seed implant. He was brought to the radiation planning suite and placed supine on the CT couch. A 3-dimensional image study set was obtained in upload to the planning computer. There, on each axial slice, I contoured the prostate gland. Then, using three-dimensional radiation planning tools I reconstructed the prostate in view of the structures from the transperineal needle pathway to assess for possible pubic arch interference. In doing so, I did not appreciate any pubic arch interference. Also, the patient's prostate volume was estimated based on the drawn structure. The volume was 41.7 cc.  Given the pubic arch appearance and prostate volume, patient remains a good candidate to proceed with prostate seed implant. Today, he freely provided informed written consent to proceed.    PLAN: The patient will undergo prostate seed implant.   ________________________________  Sheral Apley. Tammi Klippel, M.D.

## 2020-03-18 ENCOUNTER — Encounter (HOSPITAL_BASED_OUTPATIENT_CLINIC_OR_DEPARTMENT_OTHER): Payer: Self-pay | Admitting: Urology

## 2020-03-18 ENCOUNTER — Other Ambulatory Visit: Payer: Self-pay

## 2020-03-18 NOTE — Progress Notes (Addendum)
Spoke w/ via phone for pre-op interview---pt Lab needs dos---- none              Lab results------has lab appt 03-25-20 at 900 for cbc, cmet, pt, ptt COVID test ------03-25-20 at 1000 am, ekg 02-26-20 epic, chest xray 03-25-20 epic Arrive at -------530 am 03-29-20 NPO after MN NO Solid Food.  Clear liquids from MN until---430 am then npo Medications to take morning of surgery -----propranolol Diabetic medication -----n/a diet controlled Patient Special Instructions -----fleets enema pt told by dr borden could do fleets enema at hs Pre-Op special Istructions -----none Patient verbalized understanding of instructions that were given at this phone interview. Patient denies shortness of breath, chest pain, fever, cough a this phone interview.  Pt instructed to stop 81 mg aspirin 5 days per dr Alinda Money

## 2020-03-19 ENCOUNTER — Telehealth: Payer: Self-pay | Admitting: *Deleted

## 2020-03-19 NOTE — Telephone Encounter (Signed)
CALLED PATIENT TO REMIND OF LAB AND COVID TESTING FOR 03-25-20, SPOKE WITH PATIENT AND HE IS AWARE OF THESE APPTS.

## 2020-03-25 ENCOUNTER — Telehealth: Payer: Self-pay | Admitting: *Deleted

## 2020-03-25 ENCOUNTER — Encounter (HOSPITAL_COMMUNITY)
Admission: RE | Admit: 2020-03-25 | Discharge: 2020-03-25 | Disposition: A | Discharge status: Home / Self Care | Payer: Medicare PPO | Source: Ambulatory Visit | Attending: Urology | Admitting: Urology

## 2020-03-25 ENCOUNTER — Other Ambulatory Visit: Payer: Self-pay

## 2020-03-25 ENCOUNTER — Other Ambulatory Visit (HOSPITAL_COMMUNITY)
Admission: RE | Admit: 2020-03-25 | Discharge: 2020-03-25 | Disposition: A | Discharge status: Home / Self Care | Payer: Medicare PPO | Source: Ambulatory Visit | Attending: Urology | Admitting: Urology

## 2020-03-25 DIAGNOSIS — Z01812 Encounter for preprocedural laboratory examination: Secondary | ICD-10-CM | POA: Diagnosis present

## 2020-03-25 DIAGNOSIS — Z20822 Contact with and (suspected) exposure to covid-19: Secondary | ICD-10-CM | POA: Diagnosis not present

## 2020-03-25 LAB — COMPREHENSIVE METABOLIC PANEL
ALT: 33 U/L (ref 0–44)
AST: 24 U/L (ref 15–41)
Albumin: 3.8 g/dL (ref 3.5–5.0)
Alkaline Phosphatase: 44 U/L (ref 38–126)
Anion gap: 11 (ref 5–15)
BUN: 32 mg/dL — ABNORMAL HIGH (ref 8–23)
CO2: 24 mmol/L (ref 22–32)
Calcium: 9 mg/dL (ref 8.9–10.3)
Chloride: 102 mmol/L (ref 98–111)
Creatinine, Ser: 1.16 mg/dL (ref 0.61–1.24)
GFR calc Af Amer: >60 mL/min (ref >60)
GFR calc non Af Amer: >60 mL/min (ref >60)
Glucose, Bld: 278 mg/dL — ABNORMAL HIGH (ref 70–99)
Potassium: 4.5 mmol/L (ref 3.5–5.1)
Sodium: 137 mmol/L (ref 135–145)
Total Bilirubin: 0.5 mg/dL (ref 0.3–1.2)
Total Protein: 6.6 g/dL (ref 6.5–8.1)

## 2020-03-25 LAB — CBC
HCT: 43 % (ref 39.0–52.0)
Hemoglobin: 14.1 g/dL (ref 13.0–17.0)
MCH: 31.5 pg (ref 26.0–34.0)
MCHC: 32.8 g/dL (ref 30.0–36.0)
MCV: 96 fL (ref 80.0–100.0)
Platelets: 154 10*3/uL (ref 150–400)
RBC: 4.48 MIL/uL (ref 4.22–5.81)
RDW: 12.4 % (ref 11.5–15.5)
WBC: 5.2 10*3/uL (ref 4.0–10.5)
nRBC: 0 % (ref 0.0–0.2)

## 2020-03-25 LAB — APTT: aPTT: 24 seconds (ref 24–36)

## 2020-03-25 LAB — PROTIME-INR
INR: 1 (ref 0.8–1.2)
Prothrombin Time: 12.8 seconds (ref 11.4–15.2)

## 2020-03-25 LAB — SARS CORONAVIRUS 2 (TAT 6-24 HRS): SARS Coronavirus 2: NEGATIVE

## 2020-03-25 NOTE — Telephone Encounter (Signed)
Called patient to remind of implant for 03-29-20, spoke with patient and he is aware of this procedure

## 2020-03-26 NOTE — H&P (Signed)
Office Visit Report 03/16/2020    Miguel Miller         MRN: [id number] PRIMARY CARE:  Priscille Heidelberg. Little, MD   REFERRING:  Priscille Heidelberg. Little, MD  DOB: 1940/12/19, 79 year old Male PROVIDER:  Raynelle Bring, M.D.  SSN: -[id number] LOCATION:  [facility] [phone]    CC/HPI: 1. Prostate cancer  2. Testosterone deficiency   Miguel Miller is a 79 year old gentleman who has been followed by Dr. Karsten Ro for BPH and testosterone deficiency. He had previously been on finasteride but stopped this approximately 5 years ago after undergoing a transurethral surgery by Dr. Gaynelle Arabian. In addition, he has been on testosterone replacement therapy for almost 15 years. Thinking back, he believes that he started this related to symptoms of erectile dysfunction. He had been on testosterone placement therapy until his diagnosis of prostate cancer in February. He has had refractory erectile dysfunction for about the last 10 years now after having been through treatments including oral medications and even intracavernosal injection therapy. However, he and his wife now are sexually intimate without vaginal intercourse and have remained quite active. Off testosterone replacement since February, he has noted a significant decline in his libido which is troubling him. He does not remember that he actually started therapy because of a decreased libido although he cannot remember. This is his main complaint right now along with the fact that he has developed hot flashes since discontinuation of testosterone replacement.   His prostate cancer was diagnosed in February. 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 After reviewing options with Dr. Karsten Ro and Dr. Tammi Klippel, he has elected to proceed with brachytherapy in is scheduled for that procedure later this month.     ALLERGIES: Hydrocodone-Acetaminophen CAPS Statins     MEDICATIONS: Sildenafil Citrate  100 mg tablet 1 tablet PO PRN  Triple Mix PGE1 80 Micrograms, Papaverine 30 mg, phentolamine 1 mg and lidocaine 10 mg/mL Dispense 10 cc  Viagra 100 mg tablet 1 tablet PO PRN  Acetaminophen 500 mg tablet 0 Oral  Ambien 10 mg tablet Oral  Androgel 20.25 mg/1.25 gram per actuation (1.62 %) gel in metered-dose pump 0 Transdermal  Aspirin 81 MG TABS Oral  Diazepam 5 mg tablet 0 Oral  Dilaudid 2 mg tablet 0 Oral  Finasteride 5 mg tablet 0 Oral  Lisinopril 20 MG Oral Tablet Oral  Melatonin TABS Oral  Uribel 118 mg-10 mg-40.8 mg-36 mg-0.12 mg capsule 1 tablet PO BID PRN  Zetia 10 MG Oral Tablet Oral     GU PSH: Complex Uroflow - 2017 Laser Surgery Prostate - 2018 Prostate Needle Biopsy - 10/14/2019 Vasectomy - 2011       PSH Notes: Repair Of Retinal Detachment   NON-GU PSH: Back Surgery (Unspecified) Cataract Surgery.. Hip Replacement, Left Neck Surgery (Unspecified) Revise Knee Joint - 2011 Shoulder Surgery (Unspecified) Surgical Pathology, Gross And Microscopic Examination For Prostate Needle - 10/14/2019         GU PMH: Prostate Cancer, After discussing the option he indicated that he would like to proceed with treatment with radioactive seeds. I will schedule him for an appointment with Dr. Tammi Klippel - 10/21/2019 Elevated PSA - 10/14/2019, I noted the left lobe of his prostate seems a little bit larger than the right although no nodularity was palpable. His PSA has now increased to 8.38 and therefore I have recommended further evaluation with a prostate biopsy especially since  he is currently on testosterone replacement. He will stop his daily aspirin until after the biopsy., - 09/16/2019 (Stable), He indicated that his most recent PSA was within the normal range off finasteride. I will obtain the results plan to see him back again in 6 months for re-evaluation., - 2019 (Stable), He has been getting his PSA done by Dr. Rex Kras. His PSA in 5/18 was 2.62 which, when corrected for the effects of  finasteride would be 5.24. I will have his previous PSAs sent over in order to compare the stability of his PSA and continue to monitor this., - 2018 BPH w/o LUTS (Stable), He has a history of BPH with outlet obstruction which was treated with the laser and he is voiding with a good stream I told him he could stop taking finasteride. - 2018 BPH w/LUTS - 2018, - 2017, Benign prostatic hyperplasia with urinary obstruction, - 2017 ED due to arterial insufficiency - 2018, - 2017, Erectile dysfunction due to arterial insufficiency, - 2017, Erectile dysfunction due to arterial insufficiency, - 2017 Incomplete bladder emptying - 2017 Primary hypogonadism - 2017, Hypogonadism, testicular, - 2017 Gross hematuria, Gross hematuria - 2016 Peyronies Disease, Fibrosis of corpus cavernosum or penis - 2016 Urinary Frequency, Urinary frequency - 2016      PMH Notes: BPH with outlet obstruction: He underwent thallium laser vaporization of the prostate by Dr. Gaynelle Arabian on 10/02/16.    NON-GU PMH: Other specified postprocedural states - 2018 Encounter for general adult medical examination without abnormal findings, Encounter for preventive health examination - 2014 Anxiety, Anxiety - 2014 Personal history of other diseases of the circulatory system, History of hypertension - 2014 Personal history of other diseases of the digestive system, History of esophageal reflux - 2014 Personal history of other endocrine, nutritional and metabolic disease, History of hypercholesterolemia - 2014, History of diabetes mellitus, - 2014    FAMILY HISTORY: Cancer - Mother Diabetes - Mother Family Health Status Number - Runs In Family Father Deceased At Age39 ___ - Runs In [place] Hypertension - Mother Mother Deceased At Age 53 from diabetic complicati - Runs In Family Prostate Cancer - Grandfather   SOCIAL HISTORY: Marital Status: Married Preferred Language: English; Ethnicity: Not Hispanic Or  Latino; Race: White Current Smoking Status: Patient has never smoked.  Social Drinker.  Drinks 1 caffeinated drink per day.    REVIEW OF SYSTEMS:     GU Review Male:  Patient denies frequent urination, hard to postpone urination, burning/ pain with urination, get up at night to urinate, leakage of urine, stream starts and stops, trouble starting your streams, and have to strain to urinate .    Gastrointestinal (Lower):  Patient denies diarrhea and constipation.    Gastrointestinal (Upper):  Patient denies nausea and vomiting.    Constitutional:  Patient denies fever, night sweats, weight loss, and fatigue.    Skin:  Patient denies skin rash/ lesion and itching.    Eyes:  Patient denies blurred vision and double vision.    Ears/ Nose/ Throat:  Patient denies sore throat and sinus problems.    Hematologic/Lymphatic:  Patient denies swollen glands and easy bruising.    Cardiovascular:  Patient denies leg swelling and chest pains.    Respiratory:  Patient denies cough and shortness of breath.    Endocrine:  Patient denies excessive thirst.    Musculoskeletal:  Patient denies back pain and joint pain.    Neurological:  Patient denies headaches and dizziness.    Psychologic:  Patient denies depression and anxiety.    VITAL SIGNS:       03/16/2020 03:37 PM     Weight 230 lb / 104.33 kg     Height 68 in / 172.72 cm     BP 149/75 mmHg     Pulse 83 /min     Temperature 97.1 F / 36.1 C     BMI 35.0 kg/m     MULTI-SYSTEM PHYSICAL EXAMINATION:      Constitutional: Well-nourished. No physical deformities. Normally developed. Good grooming.     Neck: Neck symmetrical, not swollen. Normal tracheal position.     Respiratory: No labored breathing, no use of accessory muscles. Clear bilaterally.     Cardiovascular: Normal temperature, normal extremity pulses, no swelling, no varicosities. Regular rate and rhythm.     Lymphatic: No enlargement of neck, axillae, groin.     Skin: No paleness, no  jaundice, no cyanosis. No lesion, no ulcer, no rash.     Neurologic / Psychiatric: Oriented to time, oriented to place, oriented to person. No depression, no anxiety, no agitation.     Gastrointestinal: No mass, no tenderness, no rigidity, non obese abdomen.     Eyes: Normal conjunctivae. Normal eyelids.     Ears, Nose, Mouth, and Throat: Left ear no scars, no lesions, no masses. Right ear no scars, no lesions, no masses. Nose no scars, no lesions, no masses. Normal hearing. Normal lips.     Musculoskeletal: Normal gait and station of head and neck.            Complexity of Data:   Lab Test Review:  PSA  Records Review:  Pathology Reports, Previous Patient Records    09/10/19 01/31/18 01/04/17 10/14/14 09/26/13  PSA  Total PSA 8.38 ng/mL 4.32 ng/mL 2.62 ng/dl 2.9 ng/dl 3.6 ng/dl  Free PSA 0.78 ng/mL 0.54 ng/mL     % Free PSA 9 % PSA 13 % PSA       01/31/18  Hormones  Testosterone, Total 694.3 ng/dL    PROCEDURES:    Urinalysis  Dipstick Dipstick Cont'd  Color: Yellow Bilirubin: Neg mg/dL  Appearance: Clear Ketones: Neg mg/dL  Specific Gravity: 1.025 Blood: Neg ery/uL  pH: 5.5 Protein: Neg mg/dL  Glucose: Neg mg/dL Urobilinogen: 0.2 mg/dL   Nitrites: Neg   Leukocyte Esterase: Neg leu/uL    ASSESSMENT:     ICD-10 Details  1 GU:  Prostate Cancer - C61    PLAN:   Orders  Labs PSA, Total Testosterone  Schedule  Labs: 6 Months - PSA   6 Months - Total Testosterone  Return Visit/Planned Activity: 6 Months - Office Visit  Return Visit/Planned Activity: Keep Scheduled Appointment - Schedule Surgery  Document  Letter(s):  Created for Patient: Clinical Summary   Notes:  1. Favorable intermediate risk prostate cancer: We had a detailed discussion regarding his prostate cancer situation and he confirmed his decision that he wishes to proceed with brachytherapy. He feels very well informed and any questions today were answered to his stated satisfaction. We did discuss the  potential side effects radiation therapy and the process of monitoring his cancer. He is scheduled to proceed with this procedure and then has a follow-up already scheduled with Jiles Crocker, NP. I will schedule him for a follow-up approximately 6 months for now to begin PSA surveillance. He will have a PSA recheck today prior to treatment. He understands that this may have significantly declined considering the fact that he is off testosterone replacement  and may in fact have very low testosterone levels.   2. Testosterone deficiency/decreased libido/hot flashes: We did have a detailed discussion regarding his testosterone replacement therapy and the pros and cons of restarting this at some point. We discussed the complex interaction between testosterone levels and prostate cancer. He understands that the decision to proceed with therapy is simply discussion of risk/benefit. We agreed to recheck his testosterone level to establish a baseline today. He understands that it is possible for his hypothalamic-pituitary-testis axis to reset and start producing his own baseline level of testosterone. He understands that it may be that his hot flashes improve with time and that his libido may improve with time. We have agreed to keep him off testosterone replacement therapy until he is beyond his treatment and we are sure that he is having an appropriate response to treatment. If his PSA is declining as expected 6 months from therapy and if he feels that he is still symptomatic from testosterone deficiency, we certainly can talk about restarting therapy at that time and feel relatively safe about it.   Cc: Dr. [name]  Dr. Tyler Pita   Next Appointment:    Next Appointment: 03/29/2020 07:30 AM   Appointment Type: Surgery    Location: [facility] [phone]   Provider: Raynelle Bring, M.D.   Reason for Visit: NE/OP RADIOACTIVE SEED IMPLANT AND SPACE OAR   E & M CODES: We spent 48  minutes dedicated to evaluation and management time, including face to face interaction, discussions on coordination of care, documentation, result review, and discussion with others as applicable.   * Signed by Raynelle Bring, M.D. on 03/16/20 at 4:58 PM (EDT)*

## 2020-03-28 NOTE — Anesthesia Preprocedure Evaluation (Addendum)
Anesthesia Evaluation  Patient identified by MRN, date of birth, ID band Patient awake    Reviewed: Allergy & Precautions, NPO status , Patient's Chart, lab work & pertinent test results  Airway Mallampati: III  TM Distance: >3 FB Neck ROM: Limited    Dental  (+) Dental Advisory Given   Pulmonary neg pulmonary ROS,    breath sounds clear to auscultation       Cardiovascular hypertension, Pt. on medications and Pt. on home beta blockers + CAD   Rhythm:Regular Rate:Normal     Neuro/Psych negative neurological ROS     GI/Hepatic Neg liver ROS, GERD  ,  Endo/Other  diabetes  Renal/GU negative Renal ROS     Musculoskeletal   Abdominal   Peds  Hematology negative hematology ROS (+)   Anesthesia Other Findings   Reproductive/Obstetrics                             Lab Results  Component Value Date   WBC 5.2 03/25/2020   HGB 14.1 03/25/2020   HCT 43.0 03/25/2020   MCV 96.0 03/25/2020   PLT 154 03/25/2020   Lab Results  Component Value Date   CREATININE 1.16 03/25/2020   BUN 32 (H) 03/25/2020   NA 137 03/25/2020   K 4.5 03/25/2020   CL 102 03/25/2020   CO2 24 03/25/2020    Anesthesia Physical Anesthesia Plan  ASA: II  Anesthesia Plan: General   Post-op Pain Management:    Induction: Intravenous  PONV Risk Score and Plan: 2 and Dexamethasone, Ondansetron and Treatment may vary due to age or medical condition  Airway Management Planned: LMA  Additional Equipment:   Intra-op Plan:   Post-operative Plan: Extubation in OR  Informed Consent: I have reviewed the patients History and Physical, chart, labs and discussed the procedure including the risks, benefits and alternatives for the proposed anesthesia with the patient or authorized representative who has indicated his/her understanding and acceptance.     Dental advisory given  Plan Discussed with: CRNA  Anesthesia  Plan Comments:        Anesthesia Quick Evaluation

## 2020-03-29 ENCOUNTER — Ambulatory Visit (HOSPITAL_COMMUNITY): Payer: Medicare PPO

## 2020-03-29 ENCOUNTER — Ambulatory Visit (HOSPITAL_BASED_OUTPATIENT_CLINIC_OR_DEPARTMENT_OTHER): Payer: Medicare PPO | Admitting: Anesthesiology

## 2020-03-29 ENCOUNTER — Ambulatory Visit (HOSPITAL_BASED_OUTPATIENT_CLINIC_OR_DEPARTMENT_OTHER)
Admission: RE | Admit: 2020-03-29 | Discharge: 2020-03-29 | Disposition: A | Discharge status: Home / Self Care | Payer: Medicare PPO | Attending: Urology | Admitting: Urology

## 2020-03-29 ENCOUNTER — Encounter (HOSPITAL_BASED_OUTPATIENT_CLINIC_OR_DEPARTMENT_OTHER)
Admission: RE | Disposition: A | Discharge status: Home / Self Care | Payer: Self-pay | Source: Home / Self Care | Attending: Urology

## 2020-03-29 ENCOUNTER — Encounter (HOSPITAL_BASED_OUTPATIENT_CLINIC_OR_DEPARTMENT_OTHER): Payer: Self-pay | Admitting: Urology

## 2020-03-29 DIAGNOSIS — Z833 Family history of diabetes mellitus: Secondary | ICD-10-CM | POA: Diagnosis not present

## 2020-03-29 DIAGNOSIS — E291 Testicular hypofunction: Secondary | ICD-10-CM | POA: Diagnosis not present

## 2020-03-29 DIAGNOSIS — Z79899 Other long term (current) drug therapy: Secondary | ICD-10-CM | POA: Insufficient documentation

## 2020-03-29 DIAGNOSIS — Z7982 Long term (current) use of aspirin: Secondary | ICD-10-CM | POA: Insufficient documentation

## 2020-03-29 DIAGNOSIS — I2510 Atherosclerotic heart disease of native coronary artery without angina pectoris: Secondary | ICD-10-CM | POA: Insufficient documentation

## 2020-03-29 DIAGNOSIS — E7800 Pure hypercholesterolemia, unspecified: Secondary | ICD-10-CM | POA: Diagnosis not present

## 2020-03-29 DIAGNOSIS — N400 Enlarged prostate without lower urinary tract symptoms: Secondary | ICD-10-CM | POA: Diagnosis not present

## 2020-03-29 DIAGNOSIS — Z888 Allergy status to other drugs, medicaments and biological substances status: Secondary | ICD-10-CM | POA: Diagnosis not present

## 2020-03-29 DIAGNOSIS — C61 Malignant neoplasm of prostate: Secondary | ICD-10-CM | POA: Insufficient documentation

## 2020-03-29 DIAGNOSIS — Z885 Allergy status to narcotic agent status: Secondary | ICD-10-CM | POA: Diagnosis not present

## 2020-03-29 DIAGNOSIS — Z809 Family history of malignant neoplasm, unspecified: Secondary | ICD-10-CM | POA: Insufficient documentation

## 2020-03-29 DIAGNOSIS — E119 Type 2 diabetes mellitus without complications: Secondary | ICD-10-CM | POA: Diagnosis not present

## 2020-03-29 DIAGNOSIS — K219 Gastro-esophageal reflux disease without esophagitis: Secondary | ICD-10-CM | POA: Insufficient documentation

## 2020-03-29 DIAGNOSIS — I10 Essential (primary) hypertension: Secondary | ICD-10-CM | POA: Diagnosis not present

## 2020-03-29 DIAGNOSIS — Z8249 Family history of ischemic heart disease and other diseases of the circulatory system: Secondary | ICD-10-CM | POA: Insufficient documentation

## 2020-03-29 HISTORY — PX: RADIOACTIVE SEED IMPLANT: SHX5150

## 2020-03-29 HISTORY — DX: Headache, unspecified: R51.9

## 2020-03-29 HISTORY — PX: CYSTOSCOPY: SHX5120

## 2020-03-29 HISTORY — PX: SPACE OAR INSTILLATION: SHX6769

## 2020-03-29 HISTORY — DX: Retinal detachment with single break, left eye: H33.012

## 2020-03-29 LAB — GLUCOSE, CAPILLARY
Glucose-Capillary: 118 mg/dL — ABNORMAL HIGH (ref 70–99)
Glucose-Capillary: 150 mg/dL — ABNORMAL HIGH (ref 70–99)

## 2020-03-29 SURGERY — INSERTION, RADIATION SOURCE, PROSTATE
Anesthesia: General | Site: Rectum

## 2020-03-29 MED ORDER — ACETAMINOPHEN 500 MG PO TABS
ORAL_TABLET | ORAL | Status: AC
  Filled 2020-03-29: qty 2

## 2020-03-29 MED ORDER — DEXAMETHASONE SODIUM PHOSPHATE 10 MG/ML IJ SOLN
INTRAMUSCULAR | Status: AC
  Filled 2020-03-29: qty 1

## 2020-03-29 MED ORDER — FLEET ENEMA 7-19 GM/118ML RE ENEM: 1.0000 | ENEMA | Freq: Once | RECTAL | Status: DC

## 2020-03-29 MED ORDER — FENTANYL CITRATE (PF) 100 MCG/2ML IJ SOLN
INTRAMUSCULAR | Status: AC
  Filled 2020-03-29: qty 2

## 2020-03-29 MED ORDER — ACETAMINOPHEN 500 MG PO TABS
1000.0000 mg | ORAL_TABLET | Freq: Once | ORAL | Status: AC
  Administered 2020-03-29: 1000 mg via ORAL

## 2020-03-29 MED ORDER — TAMSULOSIN HCL 0.4 MG PO CAPS
0.4000 mg | ORAL_CAPSULE | Freq: Every day | ORAL | 0 refills | Status: DC
Start: 2020-03-29 — End: 2020-04-16

## 2020-03-29 MED ORDER — LIDOCAINE 2% (20 MG/ML) 5 ML SYRINGE
INTRAMUSCULAR | Status: AC
  Filled 2020-03-29: qty 5

## 2020-03-29 MED ORDER — SODIUM CHLORIDE (PF) 0.9 % IJ SOLN
INTRAMUSCULAR | Status: DC | PRN
  Administered 2020-03-29: 10 mL

## 2020-03-29 MED ORDER — LIDOCAINE HCL (CARDIAC) PF 100 MG/5ML IV SOSY
PREFILLED_SYRINGE | INTRAVENOUS | Status: DC | PRN
  Administered 2020-03-29: 60 mg via INTRAVENOUS

## 2020-03-29 MED ORDER — IOHEXOL 300 MG/ML  SOLN
INTRAMUSCULAR | Status: DC | PRN
  Administered 2020-03-29: 7 mL via URETHRAL

## 2020-03-29 MED ORDER — DEXAMETHASONE SODIUM PHOSPHATE 10 MG/ML IJ SOLN
INTRAMUSCULAR | Status: DC | PRN
Start: 2020-03-29 — End: 2020-03-29
  Administered 2020-03-29: 5 mg via INTRAVENOUS

## 2020-03-29 MED ORDER — SODIUM CHLORIDE 0.9 % IR SOLN
Status: DC | PRN
  Administered 2020-03-29: 250 mL

## 2020-03-29 MED ORDER — PROPOFOL 10 MG/ML IV BOLUS
INTRAVENOUS | Status: DC | PRN
  Administered 2020-03-29: 200 mg via INTRAVENOUS

## 2020-03-29 MED ORDER — AMISULPRIDE (ANTIEMETIC) 5 MG/2ML IV SOLN: 10.0000 mg | Freq: Once | INTRAVENOUS | Status: DC | PRN

## 2020-03-29 MED ORDER — TRAMADOL HCL 50 MG PO TABS: 50.0000 mg | ORAL_TABLET | Freq: Four times a day (QID) | ORAL | 0 refills | Status: DC | PRN

## 2020-03-29 MED ORDER — LACTATED RINGERS IV SOLN: INTRAVENOUS | Status: DC

## 2020-03-29 MED ORDER — FENTANYL CITRATE (PF) 100 MCG/2ML IJ SOLN
INTRAMUSCULAR | Status: DC | PRN
  Administered 2020-03-29 (×2): 25 ug via INTRAVENOUS
  Administered 2020-03-29: 50 ug via INTRAVENOUS

## 2020-03-29 MED ORDER — EPHEDRINE SULFATE-NACL 50-0.9 MG/10ML-% IV SOSY
PREFILLED_SYRINGE | INTRAVENOUS | Status: DC | PRN
  Administered 2020-03-29 (×4): 5 mg via INTRAVENOUS

## 2020-03-29 MED ORDER — ONDANSETRON HCL 4 MG/2ML IJ SOLN
INTRAMUSCULAR | Status: AC
  Filled 2020-03-29: qty 2

## 2020-03-29 MED ORDER — CIPROFLOXACIN IN D5W 400 MG/200ML IV SOLN
400.0000 mg | INTRAVENOUS | Status: AC
  Administered 2020-03-29: 400 mg via INTRAVENOUS

## 2020-03-29 MED ORDER — ONDANSETRON HCL 4 MG/2ML IJ SOLN
INTRAMUSCULAR | Status: DC | PRN
Start: 2020-03-29 — End: 2020-03-29
  Administered 2020-03-29: 4 mg via INTRAVENOUS

## 2020-03-29 MED ORDER — FENTANYL CITRATE (PF) 100 MCG/2ML IJ SOLN
25.0000 ug | INTRAMUSCULAR | Status: DC | PRN
  Administered 2020-03-29 (×2): 25 ug via INTRAVENOUS

## 2020-03-29 MED ORDER — PROPOFOL 10 MG/ML IV BOLUS
INTRAVENOUS | Status: AC
  Filled 2020-03-29: qty 40

## 2020-03-29 MED ORDER — CIPROFLOXACIN IN D5W 400 MG/200ML IV SOLN
INTRAVENOUS | Status: AC
  Filled 2020-03-29: qty 200

## 2020-03-29 MED ORDER — STERILE WATER FOR IRRIGATION IR SOLN
Status: DC | PRN
  Administered 2020-03-29: 3 mL

## 2020-03-29 SURGICAL SUPPLY — 37 items
BAG DRN RND TRDRP ANRFLXCHMBR (UROLOGICAL SUPPLIES) ×3
BAG URINE DRAIN 2000ML AR STRL (UROLOGICAL SUPPLIES) ×4 IMPLANT
BLADE CLIPPER SENSICLIP SURGIC (BLADE) ×4 IMPLANT
CATH FOLEY 2WAY SLVR  5CC 16FR (CATHETERS) ×4
CATH FOLEY 2WAY SLVR 5CC 16FR (CATHETERS) ×3 IMPLANT
CATH ROBINSON RED A/P 20FR (CATHETERS) ×4 IMPLANT
CLOTH BEACON ORANGE TIMEOUT ST (SAFETY) ×4 IMPLANT
CNTNR URN SCR LID CUP LEK RST (MISCELLANEOUS) ×6 IMPLANT
CONT SPEC 4OZ STRL OR WHT (MISCELLANEOUS) ×8
COVER BACK TABLE 60X90IN (DRAPES) ×4 IMPLANT
COVER MAYO STAND STRL (DRAPES) ×4 IMPLANT
DRAPE OEC MINIVIEW 54X84 (DRAPES) ×4 IMPLANT
DRAPE U-SHAPE 47X51 STRL (DRAPES) IMPLANT
DRSG TEGADERM 4X4.75 (GAUZE/BANDAGES/DRESSINGS) ×4 IMPLANT
DRSG TEGADERM 8X12 (GAUZE/BANDAGES/DRESSINGS) ×4 IMPLANT
GAUZE SPONGE 4X4 12PLY STRL LF (GAUZE/BANDAGES/DRESSINGS) ×4 IMPLANT
GLOVE BIO SURGEON STRL SZ7.5 (GLOVE) ×8 IMPLANT
GLOVE BIO SURGEON STRL SZ8 (GLOVE) ×4 IMPLANT
GLOVE SURG ORTHO 8.5 STRL (GLOVE) ×4 IMPLANT
GLOVE SURG SS PI 6.5 STRL IVOR (GLOVE) IMPLANT
GOWN STRL REUS W/TWL LRG LVL3 (GOWN DISPOSABLE) ×12 IMPLANT
HOLDER FOLEY CATH W/STRAP (MISCELLANEOUS) IMPLANT
I-Seed AgX100 ×216 IMPLANT
I-seed AGX100 ×40 IMPLANT
IMPL SPACEOAR VUE SYSTEM (Spacer) ×3 IMPLANT
IMPLANT SPACEOAR VUE SYSTEM (Spacer) ×4 IMPLANT
IV NS 1000ML (IV SOLUTION) ×4
IV NS 1000ML BAXH (IV SOLUTION) ×3 IMPLANT
KIT TURNOVER CYSTO (KITS) ×4 IMPLANT
MARKER SKIN DUAL TIP RULER LAB (MISCELLANEOUS) ×4 IMPLANT
PACK CYSTO (CUSTOM PROCEDURE TRAY) ×4 IMPLANT
SURGILUBE 2OZ TUBE FLIPTOP (MISCELLANEOUS) ×4 IMPLANT
SUT BONE WAX W31G (SUTURE) IMPLANT
SYR 10ML LL (SYRINGE) ×8 IMPLANT
TOWEL OR 17X26 10 PK STRL BLUE (TOWEL DISPOSABLE) ×4 IMPLANT
UNDERPAD 30X36 HEAVY ABSORB (UNDERPADS AND DIAPERS) ×8 IMPLANT
WATER STERILE IRR 500ML POUR (IV SOLUTION) ×4 IMPLANT

## 2020-03-29 NOTE — Progress Notes (Signed)
Pt states he's unable to take tramadol, prescribed for pain on d/c.  Dr. Alinda Money called.  Informed via Nira Conn, RN that pt cant take oxycodone, hydrocodone or tramadol.  All listed as allergies, but is able to take hydromorphone.  Dilaudid 1-2 mg q 6hr prn moderate pain,  currently on his MAR.  No new orders received.  Pt to use tylenol or his dilaudid if he has more at home, per Dr. Alinda Money.  Pt informed and verbalized his understanding.

## 2020-03-29 NOTE — Interval H&P Note (Signed)
History and Physical Interval Note:  03/29/2020 7:01 AM  Miguel Miller  has presented today for surgery, with the diagnosis of PROSTATE CANCER.  The various methods of treatment have been discussed with the patient and family. After consideration of risks, benefits and other options for treatment, the patient has consented to  Procedure(s): RADIOACTIVE SEED IMPLANT/BRACHYTHERAPY IMPLANT (N/A) SPACE OAR INSTILLATION (N/A) as a surgical intervention.  The patient's history has been reviewed, patient examined, no change in status, stable for surgery.  I have reviewed the patient's chart and labs.  Questions were answered to the patient's satisfaction.     [employer]

## 2020-03-29 NOTE — Op Note (Signed)
Preoperative diagnosis: Clinically localized adenocarcinoma of the prostate (T1c Nx Mx)  Postoperative diagnosis: Clinically localized adenocarcinoma of the prostate  Procedure: 1) Transperineal placement of radioactive seeds into the prostate                    2) Cystoscopy                    3) Insertion of SpaceOAR hydrogel   Surgeon: Pryor Curia. M.D.  Radiation oncologist: Tyler Pita, M.D.  Anesthesia: General  EBL: Minimal  Complications: None  Indication: Miguel Miller is a 79 y.o. gentleman with clinically localized prostate cancer. After discussing management options for treatment, he elected to proceed with radiotherapy. He presents today for the above procedures. The potential risks, complications, alternative options, and expected recovery course have been discussed in detail with the patient and he has provided informed consent to proceed.  Description of procedure: The patient was taken to the operating room and general anesthesia was induced. He was administered preoperative antibiotics, placed in the dorsal lithotomy position, and prepped and draped in the usual sterile fashion. Next, intraoperative transrectal ultrasonography was utilized for real-time intraoperative planning by the radiation oncology team. Once the treatment plan was completed and the seed strands created, stranded iodine 125 radiation seeds were placed utilizing a brachytherapy perineal template.  There was noted to be very little subcutaneous space between his skin and the prostate and his prostate was very mobile. 88 radioactive iodine 125 seeds into the prostate through 24 catheter needles.  The brachytherapy template was then removed.  At this point a few of the strands were noted at the skin level again likely related to some retraction of the strands and his minimal subcutaneous tissue between the prostate and skin.  These were advanced further and/or cut at the skin level so that all  strands were completely under the skin.   A site in the midline was selected on the perineum for placement of an 18 g needle with saline.  The needle was advanced above the rectum and below Denonvillier's fascia to the mid gland and confirmed to be in the midline on transverse imaging.  One cc of saline was injected confirming appropriate expansion of this space.  A total of 5 cc of saline was then injected to open the space further bilaterally.  The saline syringe was then removed and the SpaceOAR hydrogel was injected with good distribution bilaterally. Position of the radiation seeds was confirmed on fluoroscopic imaging.  Flexible cystoscopy was then performed and no seeds were identified within the bladder.  No bladder tumors, stones, or other mucosal pathology was identified within the bladder. He tolerated the procedure well and without complications. He was able to be transferred to the recovery unit in satisfactory condition.  He was given a voiding trial in the PACU.

## 2020-03-29 NOTE — Progress Notes (Signed)
Radiation Oncology         [phone] ________________________________  Name: BROOKLYN JEFF MRN: [id number]  Date: 03/29/2020  DOB: 01/08/1941       Prostate Seed Implant  YP:[id number], Lennette Bihari, MD  No ref. provider found  DIAGNOSIS: 79 y.o. gentleman with Stage T1c adenocarcinoma of the prostate with Gleason score of 3+4, and PSA of 8.38  Oncology History  Malignant neoplasm of prostate ([place])  10/14/2019 Cancer Staging   Staging form: Prostate, AJCC 8th Edition - Clinical stage from 10/14/2019: Stage IIB (cT1c, cN0, cM0, PSA: 8.4, Grade Group: 2) - Signed by Freeman Caldron, PA-C on 11/04/2019   11/04/2019 Initial Diagnosis   Malignant neoplasm of prostate ([place])    PROCEDURE: Insertion of radioactive I-125 seeds into the prostate gland.  RADIATION DOSE: 145 Gy, definitive therapy.  TECHNIQUE: LYNWOOD KUBISIAK was brought to the operating room with the urologist. He was placed in the dorsolithotomy position. He was catheterized and a rectal tube was inserted. The perineum was shaved, prepped and draped. The ultrasound probe was then introduced into the rectum to see the prostate gland.  TREATMENT DEVICE: A needle grid was attached to the ultrasound probe stand and anchor needles were placed.  3D PLANNING: The prostate was imaged in 3D using a sagittal sweep of the prostate probe. These images were transferred to the planning computer. There, the prostate, urethra and rectum were defined on each axial reconstructed image. The patient was noted to have an abnormal anatomic variation.  Rather than the prostate being located posterior to the pubic symphysis, the prostate was predominantly located below the pubic arch coursing anteriorly along the urethra.  Then, the software created an optimized 3D plan and a few seed positions were adjusted. The quality of the plan was reviewed using [facility] information for the target and the following two organs at risk:  Urethra and Rectum.  Then the accepted plan  was printed and handed off to the radiation therapist.  Under my supervision, the custom loading of the seeds and spacers was carried out and loaded into sealed vicryl sleeves.  These pre-loaded needles were then placed into the needle holder.[name]  PROSTATE VOLUME STUDY:  Using transrectal ultrasound the volume of the prostate was verified to be 39 cc.  SPECIAL TREATMENT PROCEDURE/SUPERVISION AND HANDLING: The pre-loaded needles were then delivered under sagittal guidance. A total of 24 needles were used to deposit 64 seeds in the prostate gland. The individual seed activity was 0.508 mCi.  At the completion of seed placement, when the grid was removed, it was noted that there were multiple visible strands protruding as much as 8-10 mm from the perineum.  The anatomic variation noted above was coupled with a very thin pelvic floor muscle and no significant subcutaneous fat.  A total of five seeds were removed, including two 'standard two' loaded strands, and one single seed was cut from the end of a strand.  5 additional seeds remained in the needles.  This left a total of 54 seeds in place.  SpaceOAR:  Yes, VUE  COMPLEX SIMULATION: At the end of the procedure, an anterior radiograph of the pelvis was obtained to document seed positioning and count. Cystoscopy was performed to check the urethra and bladder.  MICRODOSIMETRY: At the end of the procedure, the patient was emitting 0.0067 mR/hr at 1 meter. Accordingly, he was considered safe for hospital discharge.  PLAN: The patient will return to the radiation oncology clinic for post implant CT dosimetry in  three weeks.   ________________________________  Sheral Apley Tammi Klippel, M.D.

## 2020-03-29 NOTE — Discharge Instructions (Addendum)
You will be prescribed tamsulosin which is a medication to help you urinate over the next month.  You should call Dr. Lynne Logan office [phone]) if you feel you cannot empty your bladder well. Also, call if you develop fever > 101.  You will also be prescribed pain medication and should take an over the counter stool softener over the next week to avoid straining with bowel movements.  Followup with Dr. Alinda Money and your radiation oncologist as scheduled.    Radioactive Seed Implant Home Care Instructions   Activity:    Rest for the remainder of the day.  Do not drive or operate equipment today.  You may resume normal  activities in a few days as instructed by your physician, without risk of harmful radiation exposure to those around you, provided you follow the time and distance precautions on the Radiation Oncology Instruction Sheet.   Meals: Drink plenty of lipuids and eat light foods, such as gelatin or soup this evening .  You may return to normal meal plan tomorrow.  Return To Work: You may return to work as instructed by Naval architect.  Special Instruction:   If any seeds are found, use tweezers to pick up seeds and place in a glass container of any kind and bring to your physician's office.  Call your physician if any of these symptoms occur:   Persistent or heavy bleeding  Urine stream diminishes or stops completely after catheter is removed  Fever equal to or greater than 101 degrees F  Cloudy urine with a strong foul odor  Severe pain  You may feel some burning pain and/or hesitancy when you urinate after the catheter is removed.  These symptoms may increase over the next few weeks, but should diminish within forur to six weeks.  Applying moist heat to the lower abdomen or a hot tub bath may help relieve the pain.  If the discomfort becomes severe, please call your physician for additional medications.     Post Anesthesia Home Care Instructions  Activity: Get  plenty of rest for the remainder of the day. A responsible individual must stay with you for 24 hours following the procedure.  For the next 24 hours, DO NOT: -Drive a car -Paediatric nurse -Drink alcoholic beverages -Take any medication unless instructed by your physician -Make any legal decisions or sign important papers.  Meals: Start with liquid foods such as gelatin or soup. Progress to regular foods as tolerated. Avoid greasy, spicy, heavy foods. If nausea and/or vomiting occur, drink only clear liquids until the nausea and/or vomiting subsides. Call your physician if vomiting continues.  Special Instructions/Symptoms: Your throat may feel dry or sore from the anesthesia or the breathing tube placed in your throat during surgery. If this causes discomfort, gargle with warm salt water. The discomfort should disappear within 24 hours.

## 2020-03-29 NOTE — Anesthesia Procedure Notes (Signed)
Procedure Name: LMA Insertion Date/Time: 03/29/2020 7:46 AM Performed by: Raenette Rover, CRNA Pre-anesthesia Checklist: Patient identified, Emergency Drugs available, Patient being monitored and Suction available Patient Re-evaluated:Patient Re-evaluated prior to induction Oxygen Delivery Method: Circle system utilized Preoxygenation: Pre-oxygenation with 100% oxygen Induction Type: IV induction LMA: LMA inserted LMA Size: 5.0 Number of attempts: 1 Placement Confirmation: positive ETCO2 and breath sounds checked- equal and bilateral Tube secured with: Tape Dental Injury: Teeth and Oropharynx as per pre-operative assessment

## 2020-03-29 NOTE — Transfer of Care (Signed)
Immediate Anesthesia Transfer of Care Note  Patient: Miguel Miller  Procedure(s) Performed: RADIOACTIVE SEED IMPLANT/BRACHYTHERAPY IMPLANT (N/A Prostate) SPACE OAR INSTILLATION (N/A Rectum) CYSTOSCOPY FLEXIBLE (N/A Bladder)  Patient Location: PACU  Anesthesia Type:General  Level of Consciousness: awake, alert , oriented and patient cooperative  Airway & Oxygen Therapy: Patient Spontanous Breathing and Patient connected to nasal cannula oxygen  Post-op Assessment: Report given to RN and Post -op Vital signs reviewed and stable  Post vital signs: Reviewed and stable  Last Vitals:  Vitals Value Taken Time  BP 159/70 03/29/20 0907  Temp    Pulse 71 03/29/20 0910  Resp 15 03/29/20 0910  SpO2 100 % 03/29/20 0910  Vitals shown include unvalidated device data.  Last Pain:  Vitals:   03/29/20 0557  TempSrc: Oral  PainSc: 0-No pain      Patients Stated Pain Goal: 3 (75/44/92 0100)  Complications: No complications documented.

## 2020-03-29 NOTE — Anesthesia Postprocedure Evaluation (Signed)
Anesthesia Post Note  Patient: Miguel Miller  Procedure(s) Performed: RADIOACTIVE SEED IMPLANT/BRACHYTHERAPY IMPLANT (N/A Prostate) SPACE OAR INSTILLATION (N/A Rectum) CYSTOSCOPY FLEXIBLE (N/A Bladder)     Patient location during evaluation: PACU Anesthesia Type: General Level of consciousness: awake and alert Pain management: pain level controlled Vital Signs Assessment: post-procedure vital signs reviewed and stable Respiratory status: spontaneous breathing, nonlabored ventilation, respiratory function stable and patient connected to nasal cannula oxygen Cardiovascular status: blood pressure returned to baseline and stable Postop Assessment: no apparent nausea or vomiting Anesthetic complications: no   No complications documented.  Last Vitals:  Vitals:   03/29/20 1000 03/29/20 1055  BP: (!) 143/63 (!) 141/65  Pulse: 64 62  Resp: 20 16  Temp:  36.4 C  SpO2: 100% 99%    Last Pain:  Vitals:   03/29/20 1045  TempSrc:   PainSc: 0-No pain                 Tiajuana Amass

## 2020-03-30 ENCOUNTER — Encounter (HOSPITAL_BASED_OUTPATIENT_CLINIC_OR_DEPARTMENT_OTHER): Payer: Self-pay | Admitting: Urology

## 2020-04-14 ENCOUNTER — Telehealth: Payer: Self-pay | Admitting: *Deleted

## 2020-04-14 NOTE — Telephone Encounter (Signed)
CALLED PATIENT TO REMIND OF POST SEED APPTS. FOR 04-15-20, SPOKE WITH PATIENT AND HE IS AWARE OF THESE APPTS.

## 2020-04-15 ENCOUNTER — Encounter: Payer: Self-pay | Admitting: Urology

## 2020-04-15 ENCOUNTER — Other Ambulatory Visit: Payer: Self-pay

## 2020-04-15 ENCOUNTER — Ambulatory Visit
Admission: RE | Admit: 2020-04-15 | Discharge: 2020-04-15 | Disposition: A | Discharge status: Home / Self Care | Payer: Medicare PPO | Source: Ambulatory Visit | Attending: Radiation Oncology | Admitting: Radiation Oncology

## 2020-04-15 ENCOUNTER — Encounter: Payer: Self-pay | Admitting: Licensed Clinical Social Worker

## 2020-04-15 ENCOUNTER — Ambulatory Visit
Admission: RE | Admit: 2020-04-15 | Discharge: 2020-04-15 | Disposition: A | Discharge status: Home / Self Care | Payer: Medicare PPO | Source: Ambulatory Visit | Attending: Urology | Admitting: Urology

## 2020-04-15 ENCOUNTER — Encounter: Payer: Self-pay | Admitting: Medical Oncology

## 2020-04-15 VITALS — BP 107/60 | HR 74 | Temp 97.8°F | Resp 20 | Ht 68.0 in

## 2020-04-15 DIAGNOSIS — Z923 Personal history of irradiation: Secondary | ICD-10-CM | POA: Diagnosis not present

## 2020-04-15 DIAGNOSIS — C61 Malignant neoplasm of prostate: Secondary | ICD-10-CM

## 2020-04-15 DIAGNOSIS — R5383 Other fatigue: Secondary | ICD-10-CM | POA: Diagnosis not present

## 2020-04-15 DIAGNOSIS — R3911 Hesitancy of micturition: Secondary | ICD-10-CM | POA: Diagnosis not present

## 2020-04-15 DIAGNOSIS — Z79899 Other long term (current) drug therapy: Secondary | ICD-10-CM | POA: Insufficient documentation

## 2020-04-15 DIAGNOSIS — R350 Frequency of micturition: Secondary | ICD-10-CM | POA: Diagnosis not present

## 2020-04-15 NOTE — Progress Notes (Signed)
[name] states he did well with surgery but has had post procedure pain in the groin and scrotal area. He saw Dr. Alinda Money yesterday and  prescribed meloxicam. He has noticed a decrease in pain after taking 2 doses. He is not experiencing any urinary frequency or hesitation.He has post op follow up with Dr. Alinda Money 8/20.

## 2020-04-15 NOTE — Progress Notes (Signed)
Mr Popowski is here for a follow-up appointment today. Patient reports : Dysuria No Hematuria  No Leakage  no Stream strong  Emptying bladder Most of the time Bowels States that he has some constipation Urgency none Fatigue  States that his fatigue is moderate Pain  Yes in groin area and inplant site  Last seen urologist Saw urologist yesterday for pain . Will see again in two weeks. Vitals:   04/15/20 1331  BP: 107/60  Pulse: 74  Resp: 20  Temp: 97.8 F (36.6 C)  SpO2: 98%  Height: 5\' 8"  (1.727 m)

## 2020-04-15 NOTE — Progress Notes (Signed)
Radiation Oncology         [phone] ________________________________  Name: Miguel Miller MRN: [id number]  Date: 04/15/2020  DOB: 02/18/1941  Post-Seed Follow-Up Visit Note  CC: Miguel Fess, MD  Miguel Rhodes, MD  Diagnosis:   79 y.o. gentleman with Stage T1c adenocarcinoma of the prostate with Gleason score of 3+4, and PSA of 8.38    ICD-10-CM   1. Malignant neoplasm of prostate ([place])  C61     Interval Since Last Radiation:  2.5 weeks 03/29/20:  Insertion of radioactive I-125 seeds into the prostate gland; 145 Gy, definitive/boost therapy with placement of SpaceOAR VUE gel.  Narrative:  The patient returns today for routine follow-up.  He is complaining of increased urinary frequency and urinary hesitation symptoms but his main complaint is of persistent perineal discomfort/rectal pain into the scrotum. He filled out a questionnaire regarding urinary function today providing and overall IPSS score of 4 characterizing his symptoms as mild.  His pre-implant score was 4. He denies any abdominal pain or bowel symptoms.  He is continue taking tamsulosin daily as prescribed.  He has modest fatigue but denies any fevers, chills or night sweats.  ALLERGIES:  is allergic to tramadol, hydrocodone bitartrate [hydrocodone], oxycodone, statins, and welchol [colesevelam hcl].  Meds: Current Outpatient Medications  Medication Sig Dispense Refill  . acetaminophen (TYLENOL) 500 MG tablet Take 1,000 mg by mouth every 6 (six) hours as needed for moderate pain or headache.    . diazepam (VALIUM) 5 MG tablet Take 1 tablet (5 mg total) by mouth every 8 (eight) hours as needed for muscle spasms. Do not take with other muscle spasm medicine or pain medication. 30 tablet 0  . docusate sodium (COLACE) 100 MG capsule Take 1 capsule (100 mg total) by mouth 2 (two) times daily. To prevent constipation while taking pain medication. (Patient taking differently: Take 100 mg by mouth daily as needed. To prevent  constipation while taking pain medication.) 60 capsule 0  . ezetimibe (ZETIA) 10 MG tablet Take 10 mg by mouth every evening.     [name] HYDROmorphone (DILAUDID) 2 MG tablet Take 1-2 mg by mouth every 6 (six) hours as needed for moderate pain.     [name] lisinopril (PRINIVIL,ZESTRIL) 20 MG tablet Take 20 mg by mouth every evening.     . propranolol (INDERAL) 10 MG tablet Take 10 mg by mouth daily as needed (tremors).     . psyllium (METAMUCIL) 58.6 % powder Take 1 packet by mouth 3 (three) times daily. 2 teaspoons in am    . tamsulosin (FLOMAX) 0.4 MG CAPS capsule Take 1 capsule (0.4 mg total) by mouth at bedtime. 30 capsule 0  . traMADol (ULTRAM) 50 MG tablet Take 1-2 tablets (50-100 mg total) by mouth every 6 (six) hours as needed (pain). 8 tablet 0  . zolpidem (AMBIEN) 10 MG tablet Take 10 mg by mouth at bedtime as needed for sleep.     No current facility-administered medications for this visit.    Physical Findings: In general this is a well appearing Caucasian male in no acute distress. He's alert and oriented x4 and appropriate throughout the examination. Cardiopulmonary assessment is negative for acute distress and he exhibits normal effort.   Lab Findings: Lab Results  Component Value Date   WBC 5.2 03/25/2020   HGB 14.1 03/25/2020   HCT 43.0 03/25/2020   MCV 96.0 03/25/2020   PLT 154 03/25/2020    Radiographic Findings:  Patient underwent CT imaging in our  clinic for post implant dosimetry. The CT will be reviewed by Dr. Tammi Miller to confirm there is an adequate distribution of radioactive seeds throughout the prostate gland and ensure that there are no seeds in or near the rectum. We suspect the final radiation plan and dosimetry will show appropriate coverage of the prostate gland. He understands that we will call and inform him of any unexpected findings on further review of his imaging and dosimetry.  Impression/Plan: The patient is recovering from the effects of radiation. His urinary  symptoms should gradually improve over the next 4-6 months. We talked about this today. He is encouraged by his improvement already and is otherwise pleased with his outcome. We also talked about long-term follow-up for prostate cancer following seed implant. He understands that ongoing PSA determinations and digital rectal exams will help perform surveillance to rule out disease recurrence. He had a recent follow up appointment with Dr. Alinda Miller on 04/14/20 and will be seen again on 04/23/20 to re-assess perineal discomfort.  He has noticed some improvement in the perineal discomfort since starting Mobic which he is taking as prescribed.  He understands what to expect with his PSA measures. Patient was also educated today about some of the long-term effects from radiation including a small risk for rectal bleeding and possibly erectile dysfunction. We talked about some of the general management approaches to these potential complications. However, I did encourage the patient to contact our office or return at any point if he has questions or concerns related to his previous radiation and prostate cancer.  Today, a comprehensive survivorship care plan and treatment summary was reviewed with the patient today detailing his prostate cancer diagnosis, treatment course, potential late/long-term effects of treatment, appropriate follow-up care with recommendations for the future, and patient education resources.  A copy of this summary, along with a letter will be sent to the patient's primary care provider via fax message after today's visit.  2. Cancer screening:  Due to [name] history and his age, he should receive screening for skin cancers, colon cancer, and lung cancer.  The information and recommendations are listed on the patient's comprehensive care plan/treatment summary and were reviewed in detail with the patient.     3. Health maintenance and wellness promotion: [name] was encouraged to  consume 5-7 servings of fruits and vegetables per day. He was provided a copy of the "Nutrition Rainbow" handout, as well as the handout "Take Control of Your Health and [place]" from the [employer].  He was also encouraged to engage in moderate to vigorous exercise for 30 minutes per day most days of the week. Information was provided regarding the [facility] fitness program, which is designed for cancer survivors to help them become more physically fit after cancer treatments. We discussed that a healthy BMI is 18.5-24.9 and that maintaining a healthy weight reduces risk of cancer recurrences.  He was instructed to limit his alcohol consumption and continue to abstain from tobacco use.  Lastly, he was encouraged to use sunscreen and wear protective clothing when in the sun.     4. Support services/counseling: It is not uncommon for this period of the patient's cancer care trajectory to be one of many emotions and stressors.  Mr. Schellenberg was encouraged to take advantage of our many support services programs, support groups, and/or counseling in coping with his new life as a cancer survivor after completing anti-cancer treatment.  He was offered support today through active  listening and expressive supportive counseling.  He was given information regarding our available services and encouraged to contact me with any questions or for help enrolling in any of our support group/programs.      Nicholos Johns, PA-C

## 2020-04-15 NOTE — Progress Notes (Signed)
[facility] Psychosocial Distress Screening Clinical Social Work  Clinical Social Work was referred by distress screening protocol.  The patient scored a 5 on the Psychosocial Distress Thermometer which indicates moderate distress. Per RN note, patient declined social work Multimedia programmer. CSW team may be reconsulted in the future as needed.   ONCBCN DISTRESS SCREENING 04/15/2020  Screening Type Initial Screening  Distress experienced in past week (1-10) 5  Emotional problem type Adjusting to illness  Physical Problem type Pain;Sleep/insomnia;Constipation/diarrhea;Sexual problems;Skin dry/itchy  Physician notified of physical symptoms Yes  Referral to clinical psychology No  Referral to clinical social work Yes  Referral to dietition No  Referral to financial advocate No  Referral to support programs No  Referral to palliative care No  Other states that he does not want any one from social work to contact states that he has support at home .    Clinical Social Worker follow up needed: No.  If yes, follow up plan:  Taliah Porche, [place], LCSW

## 2020-04-16 ENCOUNTER — Other Ambulatory Visit: Payer: Self-pay | Admitting: Urology

## 2020-04-16 MED ORDER — TAMSULOSIN HCL 0.4 MG PO CAPS: 0.4000 mg | ORAL_CAPSULE | Freq: Every day | ORAL | 2 refills | Status: DC

## 2020-04-18 NOTE — Progress Notes (Signed)
  Radiation Oncology         [phone] ________________________________  Name: Miguel Miller MRN: [id number]  Date: 04/15/2020  DOB: Jul 27, 1941  COMPLEX SIMULATION NOTE  NARRATIVE:  The patient was brought to the [facility] today following prostate seed implantation approximately one month ago.  Identity was confirmed.  All relevant records and images related to the planned course of therapy were reviewed.  Then, the patient was set-up supine.  CT images were obtained.  The CT images were loaded into the planning software.  Then the prostate and rectum were contoured.  Treatment planning then occurred.  The implanted iodine 125 seeds were identified by the physics staff for projection of radiation distribution  I have requested : 3D Simulation  I have requested a DVH of the following structures: Prostate and rectum.    ________________________________  Sheral Apley Tammi Klippel, M.D.

## 2020-04-20 ENCOUNTER — Encounter: Payer: Self-pay | Admitting: Radiation Oncology

## 2020-04-20 DIAGNOSIS — C61 Malignant neoplasm of prostate: Secondary | ICD-10-CM | POA: Diagnosis not present

## 2020-04-20 NOTE — Progress Notes (Signed)
  Radiation Oncology         [phone] ________________________________  Name: Miguel Miller MRN: [id number]  Date: 04/20/2020  DOB: 02-Dec-1940  Chart Note:  This patient underwent prostate seed implant on March 29, 2020.  He seems to have tolerated the procedure relatively well.  He returned for post implant CT imaging on August 12 for routine post implant dosimetry.  I reviewed the CT imaging and contoured the patient's prostate, rectum and SpaceOAR location for subsequent radiation planning.  The CT showed that the radioactive seeds may have been lower than anticipated, in the caudad direction.  To evaluate coverage, routine CT dosimetry was completed today.  My suspicion about the seed location was confirmed.  It appears that the radioactive seeds were placed anatomically caudad to the prostate largely within the perineal subcutaneous tissue and penile bulb.  As a result, there was no substantial radiation coverage of the prostate at the prescription dose level.  I have reviewed the patient's intraoperative ultrasound imaging performed at the time of the implant.  In retrospect, it appears that the Foley balloon was inflated inside the location of the prostate gland, instead of the bladder.  As a result, the displacement of the Foley balloon caused the prostate target volume to be displaced in the caudad direction.  I telephoned Dr. Alinda Money to discuss the case today.  I also telephoned the patient to notify him about the result of his post implant dosimetry.  I scheduled the patient to present to our clinic tomorrow morning to review some of the imaging findings.  Over the phone, we did discuss some of the anticipated effects of the radioactive seeds in their current position based on available literature reporting similar cases.    ________________________________  Sheral Apley Tammi Klippel, M.D.

## 2020-04-21 ENCOUNTER — Ambulatory Visit
Admission: RE | Admit: 2020-04-21 | Discharge: 2020-04-21 | Disposition: A | Discharge status: Home / Self Care | Payer: Medicare PPO | Source: Ambulatory Visit | Attending: Radiation Oncology | Admitting: Radiation Oncology

## 2020-04-21 ENCOUNTER — Telehealth: Payer: Self-pay | Admitting: Radiation Oncology

## 2020-04-21 ENCOUNTER — Other Ambulatory Visit: Payer: Self-pay

## 2020-04-21 ENCOUNTER — Encounter: Payer: Self-pay | Admitting: Radiation Oncology

## 2020-04-21 DIAGNOSIS — C61 Malignant neoplasm of prostate: Secondary | ICD-10-CM

## 2020-04-21 DIAGNOSIS — Z79899 Other long term (current) drug therapy: Secondary | ICD-10-CM | POA: Insufficient documentation

## 2020-04-21 DIAGNOSIS — Z923 Personal history of irradiation: Secondary | ICD-10-CM | POA: Diagnosis not present

## 2020-04-21 NOTE — Telephone Encounter (Signed)
Phoned [employer]. Left voicemail message with new scripts for Medrol dose pack 4 mg and Anusol suppository 25 mg as directed by Dr. Tammi Klippel.

## 2020-04-21 NOTE — Progress Notes (Signed)
Radiation Oncology         [phone] ________________________________  Name: Miguel Miller MRN: [id number]  Date: 04/21/2020  DOB: 1941/03/23  Follow-Up Visit Note  CC: [name], MD  [name], MD  Diagnosis:    79 y.o. gentleman with Stage T1c adenocarcinoma of the prostate with Gleason score of 3+4, and PSA of 8.38    ICD-10-CM   1. Malignant neoplasm of prostate ([place])  C61     Interval Since Last Radiation:  3 weeks  Narrative:  The patient returns today for follow-up after prostate seed implant.  The patient has been experiencing significant rectal pain and burning with bowel movements and perineal pain with sitting.  The patient has been using over-the-counter Tylenol for his pain.  He was given a prescription for meloxicam by Dr. Alinda Money but does not feel that this has helped much.  He does have a prescription and supply of Dilaudid from spinal surgeries and he has been taking a half a Dilaudid infrequently when the perineal pain is flaring up.  He notes the Dilaudid does help dull the pain.  Patient denies any rectal bleeding, urinary obstructive symptoms, or other symptoms.  We spent 30-40 minutes discussing his recent procedure.  We discussed that the radioactive seeds were placed inferior to his prostate gland in the penile bulb and subcutaneous fat of the perineum.  I told them about how the typical prostate seed implant procedure is performed and explained what we think happened in great detail.  As a retired Pharmacist, hospital, Miguel Miller and his wife Jeani Hawking asked questions and acknowledged understanding throughout the conversation.  Since our hospital has performed approximately 3000 prostate seed implants since 1993, and I have done over 1000 of those since my arrival in 2001, and this is the only time this has happened, I explained to the patient that this is an unusual outcome.  There are cases like this reported in the scientific literature, and complications related to the  caudad position of seeds appear to be similar to complications of properly placed seeds.  ALLERGIES:  is allergic to tramadol, hydrocodone bitartrate [hydrocodone], oxycodone, statins, and welchol [colesevelam hcl].  Meds: Current Outpatient Medications  Medication Sig Dispense Refill  . acetaminophen (TYLENOL) 500 MG tablet Take 1,000 mg by mouth every 6 (six) hours as needed for moderate pain or headache.     . diazepam (VALIUM) 5 MG tablet Take 1 tablet (5 mg total) by mouth every 8 (eight) hours as needed for muscle spasms. Do not take with other muscle spasm medicine or pain medication. 30 tablet 0  . docusate sodium (COLACE) 100 MG capsule Take 1 capsule (100 mg total) by mouth 2 (two) times daily. To prevent constipation while taking pain medication. (Patient taking differently: Take 100 mg by mouth daily as needed. To prevent constipation while taking pain medication.) 60 capsule 0  . ezetimibe (ZETIA) 10 MG tablet Take 10 mg by mouth every evening.     [name] HYDROmorphone (DILAUDID) 2 MG tablet Take 1-2 mg by mouth every 6 (six) hours as needed for moderate pain.     [name] lisinopril (PRINIVIL,ZESTRIL) 20 MG tablet Take 20 mg by mouth every evening.     . meloxicam (MOBIC) 15 MG tablet     . psyllium (METAMUCIL) 58.6 % powder Take 1 packet by mouth 3 (three) times daily. 2 teaspoons in am    . rosuvastatin (CRESTOR) 5 MG tablet Take 5 mg by mouth daily. Takes once per week.    [name]  tamsulosin (FLOMAX) 0.4 MG CAPS capsule Take 1 capsule (0.4 mg total) by mouth at bedtime. 30 capsule 2  . zolpidem (AMBIEN) 10 MG tablet Take 10 mg by mouth at bedtime as needed for sleep.     . propranolol (INDERAL) 10 MG tablet Take 10 mg by mouth daily as needed (tremors).  (Patient not taking: Reported on 04/15/2020)     No current facility-administered medications for this encounter.    Physical Findings: The patient is in no acute distress. Patient is alert and oriented.  height is 5\' 8"  (1.727 m) and weight  is 227 lb 6.4 oz (103.1 kg). His temperature is 99.1 F (37.3 C). His blood pressure is 133/58 (abnormal) and his pulse is 94. His respiration is 20 and oxygen saturation is 99%. .   On visual inspection of the perineum, anus and scrotum, there are not external stigmata of injury or radiation dermatitis.  No bruising, no desquamation.  The perineal skin is soft and warm, and non-tender to light touch.  With pressure, I was able to elicit some internal discomfort in the perineum.  On rectal exam, the anal tone was normal, and the mucosa of the anal canal and rectum are intact.  There were no fissures, ulcerations or hemorrhoids.  The anal canal was exquisitely tender on rectal exam and the patient localized the pain to the left side of the rectum.  The rectum contained brown stool with no evidence of any blood grossly.  Lab Findings: Lab Results  Component Value Date   WBC 5.2 03/25/2020   HGB 14.1 03/25/2020   HCT 43.0 03/25/2020   PLT 154 03/25/2020    Lab Results  Component Value Date   NA 137 03/25/2020   K 4.5 03/25/2020   CO2 24 03/25/2020   GLUCOSE 278 (H) 03/25/2020   BUN 32 (H) 03/25/2020   CREATININE 1.16 03/25/2020   BILITOT 0.5 03/25/2020   ALKPHOS 44 03/25/2020   AST 24 03/25/2020   ALT 33 03/25/2020   PROT 6.6 03/25/2020   ALBUMIN 3.8 03/25/2020   CALCIUM 9.0 03/25/2020   ANIONGAP 11 03/25/2020    Radiographic Findings: DG C-Arm 1-60 Min-No Report  Result Date: 03/29/2020 Fluoroscopy was utilized by the requesting physician.  No radiographic interpretation.    Impression:  The patient is recovering from the effects of radiation.  His radioactive seed implant resulted in seed placement inferior to the gland in the penile bulb and perineal subcutaneous fat.  The seeds seem to be causing localized inflammation and pain.  The patient's prostate cancer has not received adequate treatment.  Plan: Today, I spent 60 minutes face-to-face with the patient reviewing recent  events including sharing images from the radiation planning process.  The patient expressed appreciation for the transparency and commiserated that sometimes these things happen.  He expressed that he did not find fault with me or our team.  Nevertheless, I apologized for the outcome of the procedure,took personal responsibility for the outcome and reassured him that we will work to alleviate his symptoms and treat his prostate cancer.  Today, I prescribed a hydrocortisone suppository for rectal and anal inflammation in addition to a Solu-Medrol Dosepak for the patient's inflammatory discomfort in the perineum and low-grade temperatures of 99-100.  The patient will follow up with Dr. Alinda Money on 04/23/2020.  If these steroid all interventions do not provide relief, the patient does have an established relationship with Dr. Brien Few, a pain management specialist in the neurosurgery practice.  We may  consult Dr. Brien Few regarding nerve blocks or other potential interventions.  I anticipate that the inflammatory phase of this patient's reaction will peak between 4 and 8 weeks following the prostate seed implant and then gradually resolve as the radiation level within the seeds decays.  The half-life of I-125 seeds is 59.4 days.  Following the first half-life, I have recommended that the patient consider external beam radiation treatment with IMRT as a salvage radiation treatment option to complete effective treatment of his prostate cancer.  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  _____________________________________  Sheral Apley Tammi Klippel, M.D.

## 2020-04-21 NOTE — Progress Notes (Signed)
Weight and vitals stable. Reports anal pain worse on the left side 7 on a scale of 0-10. Reports bowel movements increase anal pain greatly. Denies perineal pain. Reports his pain one week ago was more generalized. Reports managing constipation with metamucil and colace. Reports taking old script of hydromorphone intermittently to lessen anal pain. Reports taking meloxicam prescribed by Dr. Alinda Money this morning. Denies dysuria or hematuria. Reports a steady urine stream without difficulty emptying his bladder. Endorses taking flomax as directed. Reports urinary frequency related to increased water intake. Denies urinary urgency, leakage, or incontinence. Patient unable to sit comfortably due to left sided anal pain.  BP (!) 133/58   Pulse 94   Temp 99.1 F (37.3 C)   Resp 20   Ht 5\' 8"  (1.727 m)   Wt 227 lb 6.4 oz (103.1 kg)   SpO2 99%   BMI 34.58 kg/m  Wt Readings from Last 3 Encounters:  04/21/20 227 lb 6.4 oz (103.1 kg)  03/29/20 (!) 226 lb 9.6 oz (102.8 kg)  03/25/20 227 lb (103 kg)

## 2020-04-28 ENCOUNTER — Telehealth: Payer: Self-pay | Admitting: Radiation Oncology

## 2020-04-28 ENCOUNTER — Other Ambulatory Visit: Payer: Self-pay | Admitting: Urology

## 2020-04-28 MED ORDER — HYDROMORPHONE HCL 2 MG PO TABS: 1.0000 mg | ORAL_TABLET | Freq: Four times a day (QID) | ORAL | 0 refills | Status: DC | PRN

## 2020-04-28 MED ORDER — DIAZEPAM 5 MG PO TABS: 5.0000 mg | ORAL_TABLET | Freq: Three times a day (TID) | ORAL | 0 refills | Status: DC | PRN

## 2020-04-28 NOTE — Telephone Encounter (Signed)
I called and spoke with the patient. He reports that the rectal pain seems to be getting worse instead of better but is slightly relieved with Dilaudid 2mg  and Valium prn but these are medications prescribed for prior neck surgeries so he is almost out of them.  I have renewed the prescriptions.  He is interested in getting Dr. Lauris Poag opinion regarding nerve block but would like to speak with him by phone prior to driving over to the office since it is such a feat to ride in a car and get in and out of a car.  He is most willing to go in for the procedure if Dr. Brien Few feels there is a high probability that this will provide relief.  I advised that I would inform Dr. Tammi Klippel of this request so that he can call and speak with Dr. Brien Few personally and explain the situation and determine if this is indeed a good option. He anticipates a phone call from either Dr. Brien Few or Dr. Tammi Klippel to discuss pain control options.  Nicholos Johns, MMS, PA-C [facility] [place]: [phone]  Fax: [phone]

## 2020-04-28 NOTE — Telephone Encounter (Signed)
Received voicemail message from patient's wife. She explains her husband is in an increased amount of pain. She explains he has completed the steroid taper. She reports the meloxicam provides no relief. She stresses her husbands quality of life is very poor and he stays in the bed most days in pain. She request a from Dr. Tammi Klippel or his PA, Ashlyn.   Phoned back promptly. Spoke with patient directly who confirmed the above statement. Advised Dr. Tammi Klippel or PA, Bruning would be in touch soon. Patient verbalized understanding and appreciation for the prompted return call.

## 2020-04-29 ENCOUNTER — Telehealth: Payer: Self-pay | Admitting: Radiation Oncology

## 2020-04-29 NOTE — Telephone Encounter (Signed)
Noted Valium script intended to be escribed printed instead. Called in script to [facility]. Provided my direct contact number for questions or concerns.

## 2020-04-30 ENCOUNTER — Other Ambulatory Visit: Payer: Self-pay | Admitting: Radiation Oncology

## 2020-04-30 DIAGNOSIS — C61 Malignant neoplasm of prostate: Secondary | ICD-10-CM

## 2020-04-30 MED ORDER — GABAPENTIN 300 MG PO CAPS: 300.0000 mg | ORAL_CAPSULE | Freq: Three times a day (TID) | ORAL | 5 refills | Status: DC

## 2020-05-05 ENCOUNTER — Encounter: Payer: Self-pay | Admitting: Radiation Oncology

## 2020-05-05 NOTE — Progress Notes (Signed)
  Radiation Oncology         [phone] ________________________________  Name: Miguel Miller MRN: [id number]  Date: 05/05/2020  DOB: 04/01/1941  Chart Note:  I called the patient today to follow-up on his symptoms and ensure that his referral to a pain specialist was established.  The patient reports that he is actually feeling somewhat better.  He started using Neurontin this past weekend and feels that that has taken off the edge.  He is also encouraged that he'll be meeting with Dr. Johnny Bridge, from pain management in [place] on Friday.  They may discuss various options up to and including a pudendal nerve block.  Looking back at my note from August 18, I did not document one item that was important from that days visit with [name].  At the time I discussed the incident with him, I did share with him that he has a right to access any written materials and reports resulting from the investigation of his medical event.  He and his wife acknowledged hearing that and actually expressed interest in receiving and reviewing this.  ________________________________  Sheral Apley. Tammi Klippel, M.D.

## 2020-05-26 ENCOUNTER — Encounter: Payer: Self-pay | Admitting: Radiation Oncology

## 2020-05-26 ENCOUNTER — Telehealth: Payer: Self-pay | Admitting: Radiation Oncology

## 2020-05-26 NOTE — Telephone Encounter (Addendum)
  Radiation Oncology         [phone] ________________________________  Name: Miguel Miller MRN: [id number]  Date: 05/26/2020  DOB: 18-Apr-1941  Telephone contact:  DIAGNOSIS: 79 y.o. gentleman with Stage T1c adenocarcinoma of the prostate with Gleason score of 3+4, and PSA of 8.38.  Interval Since Last Radiation:  8 weeks 03/29/20:  Insertion of radioactive I-125 seeds into the prostate gland; 145 Gy, definitive/boost therapy with placement of SpaceOAR VUE gel, complicated by a misadministration of seeds missing the prostate and implanted in the penile bulb and perineal subcutaneous tissue.  NARRATIVE:  I called this patient to touch base about his progress.  He reports that he is feeling better with less pain.  He developed a painful fluid filled boil in the right perianal region.  This is now draining and feeling better.  I suspect this is not the location of any seeds, but, rather a localized boil related to immunocompromise around the radiation field.  Regardless, it is improving now.  Pain is a lot better now than it was a few weeks ago.  Previously, he could only sit for a few seconds.  Last night he was able to sit to eat supper for 45 min.  Has not taken dilaudid in 3 weeks.  Due to this improvement, he has cancelled a planned nerve block procedure with Miguel Miller, for now.  He did not tolerate Gabapentin and he was given Lyrica, but, out of reluctance about lightheadedness, he has not started Lyrica.  In discussing that situation, we also discussed how Flomax can cause orthostatic hypotension.  His lightheadedness may in fact reflect an intolerance to Flomax.  He describes getting dizzy and falling one night when he was walking from the bed to the bathroom.  After discussing today, he is going to hold off on Flomax for the next few weeks to see if the dizziness improves and follow-up with scheduled visit with Dr. Alinda Money to consider other options for managing obstructive  symptoms, like Uroxatrol.  He empirically stopped using Meloxicam, and feels that it was not effective for him, so, he stopped taking this.  IMPRESSION/PLAN:  Overall, I am encouraged with the patient's progress as we near the completion of the first half-life of his radioactive I-125 seeds.  Typically, this first half life is the time when we see maximal inflammatory reaction, and the inflammation will subsequently dissipate.  There will likely be a phase of fibrosis 12-24 months after the procedure.  During that phase, we may begin to see symptoms related to nerves entrapped in scar tissue and possible local induration and possible urinary stricture.  So, while we are not totally out of the woods, I am hopeful that the patient has weathered the worst of the procedure complications.  He is still due for definitive treatment of his prostate cancer.  We anticipate a course of IMRT to be delivered starting two months from now.  ________________________________  Sheral Apley. Tammi Klippel, M.D.

## 2020-05-31 NOTE — Progress Notes (Signed)
  Radiation Oncology         [phone] ________________________________  Name: Miguel Miller MRN: [id number]  Date: 04/20/2020  DOB: [birth year]  3D Planning Note   Prostate Brachytherapy Post-Implant Dosimetry  Diagnosis: 79 y.o. gentleman with Stage T1c adenocarcinoma of the prostate with Gleason score of 3+4, and PSA of 8.38  Narrative: On a previous date, Miguel Miller returned following prostate seed implantation for post implant planning. He underwent CT scan complex simulation to delineate the three-dimensional structures of the pelvis and demonstrate the radiation distribution.  Since that time, the seed localization, and complex isodose planning with dose volume histograms have now been completed.  Results:   Prostate Coverage - The dose of radiation delivered to the 90% or more of the prostate gland (D90) was 6% of the prescription dose. This falls significantly short of our goal of greater than 90%. Rectal Sparing - The volume of rectal tissue receiving the prescription dose or higher was 0.0 cc. This falls under our thresholds tolerance of 1.0 cc.  Impression: The prostate seed implant appears to show adequate target coverage and appropriate rectal sparing.  Plan:  The patient will continue to follow with urology for ongoing PSA determinations. I would anticipate a very low likelihood for local tumor control with minimal risk for rectal morbidity.  This patient will require additional salvage treatment perhaps with IMRT.  ________________________________  Sheral Apley. Tammi Klippel, M.D.

## 2020-07-06 DIAGNOSIS — R102 Pelvic and perineal pain: Secondary | ICD-10-CM | POA: Diagnosis not present

## 2020-07-22 DIAGNOSIS — K612 Anorectal abscess: Secondary | ICD-10-CM | POA: Diagnosis not present

## 2020-08-03 DIAGNOSIS — C61 Malignant neoplasm of prostate: Secondary | ICD-10-CM | POA: Diagnosis not present

## 2020-08-03 DIAGNOSIS — K612 Anorectal abscess: Secondary | ICD-10-CM | POA: Diagnosis not present

## 2020-08-06 ENCOUNTER — Other Ambulatory Visit: Payer: Self-pay

## 2020-08-06 ENCOUNTER — Ambulatory Visit
Admission: RE | Admit: 2020-08-06 | Discharge: 2020-08-06 | Disposition: A | Discharge status: Home / Self Care | Payer: Medicare PPO | Source: Ambulatory Visit | Attending: Radiation Oncology | Admitting: Radiation Oncology

## 2020-08-06 ENCOUNTER — Encounter: Payer: Self-pay | Admitting: Medical Oncology

## 2020-08-06 DIAGNOSIS — C61 Malignant neoplasm of prostate: Secondary | ICD-10-CM | POA: Diagnosis not present

## 2020-08-06 NOTE — Progress Notes (Signed)
  Radiation Oncology         [phone] ________________________________  Name: Miguel Miller MRN: [id number]  Date: 08/06/2020  DOB: 1941-05-28  SIMULATION AND TREATMENT PLANNING NOTE    ICD-10-CM   1. Malignant neoplasm of prostate ([place])  C61     DIAGNOSIS:   79 y.o. gentleman with Stage T1c adenocarcinoma of the prostate with Gleason score of 3+4, and PSA of 8.38 with previous seed implant with geographic inferior miss.  NARRATIVE:  The patient was brought to the [facility].  Identity was confirmed.  All relevant records and images related to the planned course of therapy were reviewed.  The patient freely provided informed written consent to proceed with treatment after reviewing the details related to the planned course of therapy. The consent form was witnessed and verified by the simulation staff.  Then, the patient was set-up in a stable reproducible supine position for radiation therapy.  A vacuum lock pillow device was custom fabricated to position his legs in a reproducible immobilized position.  Then, I performed a urethrogram under sterile conditions to identify the prostatic apex.  CT images were obtained.  Surface markings were placed.  The CT images were loaded into the planning software.  Then the prostate target and avoidance structures including the rectum, bladder, bowel and hips were contoured.  Treatment planning then occurred.  The radiation prescription was entered and confirmed.  A total of one complex treatment devices was fabricated. I have requested : Intensity Modulated Radiotherapy (IMRT) is medically necessary for this case for the following reason:  Rectal sparing.[name]  PLAN:  The patient will receive 70 Gy in 28 fractions.  ________________________________  Miguel Miller, M.D.  This document serves as a record of services personally performed by Tyler Pita, MD. It was created on his behalf by Wilburn Mylar, a trained medical  scribe. The creation of this record is based on the scribe's personal observations and the provider's statements to them. This document has been checked and approved by the attending provider.

## 2020-08-12 DIAGNOSIS — C61 Malignant neoplasm of prostate: Secondary | ICD-10-CM | POA: Diagnosis not present

## 2020-08-16 ENCOUNTER — Ambulatory Visit
Admission: RE | Admit: 2020-08-16 | Discharge: 2020-08-16 | Disposition: A | Discharge status: Home / Self Care | Payer: Medicare PPO | Source: Ambulatory Visit | Attending: Radiation Oncology | Admitting: Radiation Oncology

## 2020-08-16 DIAGNOSIS — C61 Malignant neoplasm of prostate: Secondary | ICD-10-CM | POA: Diagnosis not present

## 2020-08-17 ENCOUNTER — Ambulatory Visit
Admission: RE | Admit: 2020-08-17 | Discharge: 2020-08-17 | Disposition: A | Discharge status: Home / Self Care | Payer: Medicare PPO | Source: Ambulatory Visit | Attending: Radiation Oncology | Admitting: Radiation Oncology

## 2020-08-17 DIAGNOSIS — C61 Malignant neoplasm of prostate: Secondary | ICD-10-CM | POA: Diagnosis not present

## 2020-08-18 ENCOUNTER — Ambulatory Visit
Admission: RE | Admit: 2020-08-18 | Discharge: 2020-08-18 | Disposition: A | Discharge status: Home / Self Care | Payer: Medicare PPO | Source: Ambulatory Visit | Attending: Radiation Oncology | Admitting: Radiation Oncology

## 2020-08-18 DIAGNOSIS — C61 Malignant neoplasm of prostate: Secondary | ICD-10-CM | POA: Diagnosis not present

## 2020-08-19 ENCOUNTER — Ambulatory Visit
Admission: RE | Admit: 2020-08-19 | Discharge: 2020-08-19 | Disposition: A | Discharge status: Home / Self Care | Payer: Medicare PPO | Source: Ambulatory Visit | Attending: Radiation Oncology | Admitting: Radiation Oncology

## 2020-08-19 ENCOUNTER — Other Ambulatory Visit: Payer: Self-pay

## 2020-08-19 DIAGNOSIS — C61 Malignant neoplasm of prostate: Secondary | ICD-10-CM | POA: Diagnosis not present

## 2020-08-20 ENCOUNTER — Other Ambulatory Visit: Payer: Self-pay

## 2020-08-20 ENCOUNTER — Ambulatory Visit
Admission: RE | Admit: 2020-08-20 | Discharge: 2020-08-20 | Disposition: A | Discharge status: Home / Self Care | Payer: Medicare PPO | Source: Ambulatory Visit | Attending: Radiation Oncology | Admitting: Radiation Oncology

## 2020-08-20 DIAGNOSIS — C61 Malignant neoplasm of prostate: Secondary | ICD-10-CM | POA: Diagnosis not present

## 2020-08-23 ENCOUNTER — Ambulatory Visit
Admission: RE | Admit: 2020-08-23 | Discharge: 2020-08-23 | Disposition: A | Discharge status: Home / Self Care | Payer: Medicare PPO | Source: Ambulatory Visit | Attending: Radiation Oncology | Admitting: Radiation Oncology

## 2020-08-23 DIAGNOSIS — C61 Malignant neoplasm of prostate: Secondary | ICD-10-CM | POA: Diagnosis not present

## 2020-08-24 ENCOUNTER — Ambulatory Visit
Admission: RE | Admit: 2020-08-24 | Discharge: 2020-08-24 | Disposition: A | Discharge status: Home / Self Care | Payer: Medicare PPO | Source: Ambulatory Visit | Attending: Radiation Oncology | Admitting: Radiation Oncology

## 2020-08-24 DIAGNOSIS — C61 Malignant neoplasm of prostate: Secondary | ICD-10-CM | POA: Diagnosis not present

## 2020-08-25 ENCOUNTER — Ambulatory Visit
Admission: RE | Admit: 2020-08-25 | Discharge: 2020-08-25 | Disposition: A | Discharge status: Home / Self Care | Payer: Medicare PPO | Source: Ambulatory Visit | Attending: Radiation Oncology | Admitting: Radiation Oncology

## 2020-08-25 DIAGNOSIS — C61 Malignant neoplasm of prostate: Secondary | ICD-10-CM | POA: Diagnosis not present

## 2020-08-26 ENCOUNTER — Ambulatory Visit
Admission: RE | Admit: 2020-08-26 | Discharge: 2020-08-26 | Disposition: A | Discharge status: Home / Self Care | Payer: Medicare PPO | Source: Ambulatory Visit | Attending: Radiation Oncology | Admitting: Radiation Oncology

## 2020-08-26 DIAGNOSIS — C61 Malignant neoplasm of prostate: Secondary | ICD-10-CM | POA: Diagnosis not present

## 2020-08-30 ENCOUNTER — Other Ambulatory Visit: Payer: Self-pay

## 2020-08-30 ENCOUNTER — Ambulatory Visit
Admission: RE | Admit: 2020-08-30 | Discharge: 2020-08-30 | Disposition: A | Discharge status: Home / Self Care | Payer: Medicare PPO | Source: Ambulatory Visit | Attending: Radiation Oncology | Admitting: Radiation Oncology

## 2020-08-30 DIAGNOSIS — C61 Malignant neoplasm of prostate: Secondary | ICD-10-CM | POA: Diagnosis not present

## 2020-08-31 ENCOUNTER — Ambulatory Visit
Admission: RE | Admit: 2020-08-31 | Discharge: 2020-08-31 | Disposition: A | Discharge status: Home / Self Care | Payer: Medicare PPO | Source: Ambulatory Visit | Attending: Radiation Oncology | Admitting: Radiation Oncology

## 2020-08-31 DIAGNOSIS — C61 Malignant neoplasm of prostate: Secondary | ICD-10-CM | POA: Diagnosis not present

## 2020-09-01 ENCOUNTER — Ambulatory Visit: Payer: Medicare PPO

## 2020-09-01 ENCOUNTER — Ambulatory Visit: Payer: Medicare PPO | Admitting: Cardiovascular Disease

## 2020-09-01 ENCOUNTER — Ambulatory Visit
Admission: RE | Admit: 2020-09-01 | Discharge: 2020-09-01 | Disposition: A | Discharge status: Home / Self Care | Payer: Medicare PPO | Source: Ambulatory Visit | Attending: Radiation Oncology | Admitting: Radiation Oncology

## 2020-09-01 DIAGNOSIS — C61 Malignant neoplasm of prostate: Secondary | ICD-10-CM | POA: Diagnosis not present

## 2020-09-02 ENCOUNTER — Ambulatory Visit
Admission: RE | Admit: 2020-09-02 | Discharge: 2020-09-02 | Disposition: A | Discharge status: Home / Self Care | Payer: Medicare PPO | Source: Ambulatory Visit | Attending: Radiation Oncology | Admitting: Radiation Oncology

## 2020-09-02 DIAGNOSIS — C61 Malignant neoplasm of prostate: Secondary | ICD-10-CM | POA: Diagnosis not present

## 2020-09-06 ENCOUNTER — Ambulatory Visit
Admission: RE | Admit: 2020-09-06 | Discharge: 2020-09-06 | Disposition: A | Discharge status: Home / Self Care | Payer: Medicare PPO | Source: Ambulatory Visit | Attending: Radiation Oncology | Admitting: Radiation Oncology

## 2020-09-06 ENCOUNTER — Other Ambulatory Visit: Payer: Self-pay

## 2020-09-06 DIAGNOSIS — C61 Malignant neoplasm of prostate: Secondary | ICD-10-CM | POA: Diagnosis not present

## 2020-09-07 ENCOUNTER — Ambulatory Visit
Admission: RE | Admit: 2020-09-07 | Discharge: 2020-09-07 | Disposition: A | Discharge status: Home / Self Care | Payer: Medicare PPO | Source: Ambulatory Visit | Attending: Radiation Oncology | Admitting: Radiation Oncology

## 2020-09-07 ENCOUNTER — Other Ambulatory Visit: Payer: Self-pay

## 2020-09-07 DIAGNOSIS — C61 Malignant neoplasm of prostate: Secondary | ICD-10-CM | POA: Diagnosis not present

## 2020-09-08 ENCOUNTER — Ambulatory Visit
Admission: RE | Admit: 2020-09-08 | Discharge: 2020-09-08 | Disposition: A | Discharge status: Home / Self Care | Payer: Medicare PPO | Source: Ambulatory Visit | Attending: Radiation Oncology | Admitting: Radiation Oncology

## 2020-09-08 ENCOUNTER — Other Ambulatory Visit: Payer: Self-pay

## 2020-09-08 DIAGNOSIS — C61 Malignant neoplasm of prostate: Secondary | ICD-10-CM | POA: Diagnosis not present

## 2020-09-09 ENCOUNTER — Ambulatory Visit
Admission: RE | Admit: 2020-09-09 | Discharge: 2020-09-09 | Disposition: A | Discharge status: Home / Self Care | Payer: Medicare PPO | Source: Ambulatory Visit | Attending: Radiation Oncology | Admitting: Radiation Oncology

## 2020-09-09 DIAGNOSIS — C61 Malignant neoplasm of prostate: Secondary | ICD-10-CM | POA: Diagnosis not present

## 2020-09-10 ENCOUNTER — Other Ambulatory Visit: Payer: Self-pay

## 2020-09-10 ENCOUNTER — Ambulatory Visit
Admission: RE | Admit: 2020-09-10 | Discharge: 2020-09-10 | Disposition: A | Discharge status: Home / Self Care | Payer: Medicare PPO | Source: Ambulatory Visit | Attending: Radiation Oncology | Admitting: Radiation Oncology

## 2020-09-10 DIAGNOSIS — E291 Testicular hypofunction: Secondary | ICD-10-CM | POA: Diagnosis not present

## 2020-09-10 DIAGNOSIS — E785 Hyperlipidemia, unspecified: Secondary | ICD-10-CM | POA: Diagnosis not present

## 2020-09-10 DIAGNOSIS — E118 Type 2 diabetes mellitus with unspecified complications: Secondary | ICD-10-CM | POA: Diagnosis not present

## 2020-09-10 DIAGNOSIS — E042 Nontoxic multinodular goiter: Secondary | ICD-10-CM | POA: Diagnosis not present

## 2020-09-10 DIAGNOSIS — Z0000 Encounter for general adult medical examination without abnormal findings: Secondary | ICD-10-CM | POA: Diagnosis not present

## 2020-09-10 DIAGNOSIS — N529 Male erectile dysfunction, unspecified: Secondary | ICD-10-CM | POA: Diagnosis not present

## 2020-09-10 DIAGNOSIS — C61 Malignant neoplasm of prostate: Secondary | ICD-10-CM | POA: Diagnosis not present

## 2020-09-10 DIAGNOSIS — L570 Actinic keratosis: Secondary | ICD-10-CM | POA: Diagnosis not present

## 2020-09-10 DIAGNOSIS — G4700 Insomnia, unspecified: Secondary | ICD-10-CM | POA: Diagnosis not present

## 2020-09-10 DIAGNOSIS — I10 Essential (primary) hypertension: Secondary | ICD-10-CM | POA: Diagnosis not present

## 2020-09-10 DIAGNOSIS — R809 Proteinuria, unspecified: Secondary | ICD-10-CM | POA: Diagnosis not present

## 2020-09-13 ENCOUNTER — Ambulatory Visit
Admission: RE | Admit: 2020-09-13 | Discharge: 2020-09-13 | Disposition: A | Discharge status: Home / Self Care | Payer: Medicare PPO | Source: Ambulatory Visit | Attending: Radiation Oncology | Admitting: Radiation Oncology

## 2020-09-13 ENCOUNTER — Other Ambulatory Visit: Payer: Self-pay

## 2020-09-13 DIAGNOSIS — C61 Malignant neoplasm of prostate: Secondary | ICD-10-CM | POA: Diagnosis not present

## 2020-09-13 DIAGNOSIS — Z1211 Encounter for screening for malignant neoplasm of colon: Secondary | ICD-10-CM | POA: Diagnosis not present

## 2020-09-14 ENCOUNTER — Ambulatory Visit
Admission: RE | Admit: 2020-09-14 | Discharge: 2020-09-14 | Disposition: A | Discharge status: Home / Self Care | Payer: Medicare PPO | Source: Ambulatory Visit | Attending: Radiation Oncology | Admitting: Radiation Oncology

## 2020-09-14 DIAGNOSIS — C61 Malignant neoplasm of prostate: Secondary | ICD-10-CM | POA: Diagnosis not present

## 2020-09-15 ENCOUNTER — Encounter: Payer: Self-pay | Admitting: Endocrinology

## 2020-09-15 ENCOUNTER — Encounter: Payer: Self-pay | Admitting: Cardiovascular Disease

## 2020-09-15 ENCOUNTER — Other Ambulatory Visit: Payer: Self-pay

## 2020-09-15 ENCOUNTER — Ambulatory Visit: Payer: Medicare PPO | Admitting: Cardiovascular Disease

## 2020-09-15 ENCOUNTER — Ambulatory Visit: Payer: Medicare PPO | Admitting: Endocrinology

## 2020-09-15 ENCOUNTER — Ambulatory Visit
Admission: RE | Admit: 2020-09-15 | Discharge: 2020-09-15 | Disposition: A | Discharge status: Home / Self Care | Payer: Medicare PPO | Source: Ambulatory Visit | Attending: Radiation Oncology | Admitting: Radiation Oncology

## 2020-09-15 VITALS — BP 120/88 | HR 78 | Ht 68.0 in | Wt 222.0 lb

## 2020-09-15 VITALS — BP 140/64 | HR 74 | Ht 68.0 in | Wt 222.0 lb

## 2020-09-15 DIAGNOSIS — I2510 Atherosclerotic heart disease of native coronary artery without angina pectoris: Secondary | ICD-10-CM

## 2020-09-15 DIAGNOSIS — E042 Nontoxic multinodular goiter: Secondary | ICD-10-CM | POA: Diagnosis not present

## 2020-09-15 DIAGNOSIS — N529 Male erectile dysfunction, unspecified: Secondary | ICD-10-CM

## 2020-09-15 DIAGNOSIS — I10 Essential (primary) hypertension: Secondary | ICD-10-CM

## 2020-09-15 DIAGNOSIS — E1169 Type 2 diabetes mellitus with other specified complication: Secondary | ICD-10-CM | POA: Diagnosis not present

## 2020-09-15 DIAGNOSIS — E669 Obesity, unspecified: Secondary | ICD-10-CM

## 2020-09-15 DIAGNOSIS — E668 Other obesity: Secondary | ICD-10-CM | POA: Diagnosis not present

## 2020-09-15 DIAGNOSIS — I5189 Other ill-defined heart diseases: Secondary | ICD-10-CM | POA: Diagnosis not present

## 2020-09-15 DIAGNOSIS — C61 Malignant neoplasm of prostate: Secondary | ICD-10-CM | POA: Diagnosis not present

## 2020-09-15 DIAGNOSIS — E7800 Pure hypercholesterolemia, unspecified: Secondary | ICD-10-CM

## 2020-09-15 NOTE — Progress Notes (Signed)
Cardiology Consultation Note    Date:  09/20/2020   ID:  TRAMPUS MCQUERRY, DOB 06/02/1941, MRN [id number]  PCP:  [name], MD  Cardiologist:   Sanda Klein, MD   Chief Complaint  Patient presents with  . Follow-up    Last seen 2017.  Miguel Miller Headache    History of Present Illness:  Miguel Miller is a 80 y.o. male with obesity, well-controlled type 2 diabetes mellitus, hyperlipidemia and hypertension, coronary calcification on CT, without evidence of coronary insufficiency on nuclear stress testing and normal LV function by echo in 2017.  He has no CV complaints, but has problems with erectile dysfunction, that no longer responds to direct penile injections. He is seeing Dr. Alinda Money at [facility]. Had unsuccessful radioactive seed implants and is completing EBRT for prostate cancer.  BP meds had to be stopped last fall due to symptomatic hypotension. He has lost weight. SBP 140 today, but BP was 126/80 last week.   Wonders whether he has PAD. CT did show nonobstructive aortoiliac atherosclerosis, but he had normal ABIs bilaterally in 2017. Has good peripheral pulses today. Not on any hemodynamically active meds (except tamsulosin). On statin weekly + ezetimibe, recent LDL was 89. A1c 6.6%.  The patient specifically denies any chest pain at rest exertion, dyspnea at rest or with exertion, orthopnea, paroxysmal nocturnal dyspnea, syncope, palpitations, focal neurological deficits (except for worsening bilateral hand tremor), intermittent claudication, lower extremity edema, unexplained weight gain, cough, hemoptysis or wheezing.  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  He has previously undergone bilateral knee surgery.    Past Medical History:  Diagnosis Date  . Actinic keratoses   . Arteriosclerosis   . BPH (benign prostatic hyperplasia)   .  Diabetes mellitus type 2, controlled, without complications ([place])    diet controlled  . Diverticulosis   . DJD (degenerative joint disease)   . Dyslipidemia   . Eczema   . ED (erectile dysfunction)   . Essential tremor   . GERD (gastroesophageal reflux disease)   . Headache   . Hypertension   . Low testosterone   . Prostate cancer ([place]) 11/2019  . Retinal detachment of left eye with single retinal tear yrs ago  . Thrombocytopenia ([place])   . Thyroid goiter    worked up for 3 -4 yrs ago no tx for    Past Surgical History:  Procedure Laterality Date  . ANTERIOR CERVICAL DECOMP/DISCECTOMY FUSION N/A 11/28/2019   Procedure: Anterior Cervical Decompression Fusion - Cervical Three - Cervical Four;  Surgeon: Earnie Larsson, MD;  Location: [place];  Service: Neurosurgery;  Laterality: N/A;  Cervical Three  . BACK SURGERY     LUMBAR  2-3   03/2011  . BACK SURGERY     Lami/microdiscectomy 02/13/18 - Dr. Earnie Larsson  . BIOPSY THYROID    . c-spine  2001   Dr. Annette Stable plates and screws  . CATARACT EXTRACTION  1997  . CATARACT EXTRACTION     LEFT   1998  . CYSTOSCOPY N/A 03/29/2020   Procedure: CYSTOSCOPY FLEXIBLE;  Surgeon: Raynelle Bring, MD;  Location: [facility];  Service: Urology;  Laterality: N/A;  . fusion L2  2012   Dr. Annette Stable  . HEMORRHOIDECTOMY WITH HEMORRHOID BANDING  30 yrs ago  . KNEE SURGERY Bilateral 2005   Dr. Berenice Primas knee replacement  .  l-spine  2003   Dr. Annette Stable  . laser surgery for holes in retina Right 08/2012  . outpatient right shoulder  2008   Dr. Percell Miller  . PROSTATE SURGERY     09/2016  LASER dr Gaynelle Arabian  . PYLOROMYOTOMY     1944  . RADIOACTIVE SEED IMPLANT N/A 03/29/2020   Procedure: RADIOACTIVE SEED IMPLANT/BRACHYTHERAPY IMPLANT;  Surgeon: Raynelle Bring, MD;  Location: [facility];  Service: Urology;  Laterality: N/A;  . RETINAL DETACHMENT SURGERY Right 1996   Dr. Glennon Mac  . retineal tear Left 11/2012   Dr. Darrick Grinder  . SPACE OAR  INSTILLATION N/A 03/29/2020   Procedure: SPACE OAR INSTILLATION;  Surgeon: Raynelle Bring, MD;  Location[facility];  Service: Urology;  Laterality: N/A;  . SPINAL FUSION     Pedicle screw and interbody fusion L2-3, L3-4, and L4-5  . TOTAL HIP ARTHROPLASTY Left 06/05/2017   dr tim  Percell Miller  . TOTAL HIP ARTHROPLASTY Left 06/05/2017   Procedure: TOTAL HIP ARTHROPLASTY ANTERIOR APPROACH;  Surgeon: Renette Butters, MD;  Location: [place];  Service: Orthopedics;  Laterality: Left;  Miguel Miller VASECTOMY      Current Medications: Outpatient Medications Prior to Visit  Medication Sig Dispense Refill  . acetaminophen (TYLENOL) 500 MG tablet Take 1,000 mg by mouth every 6 (six) hours as needed for moderate pain or headache.     . diazepam (VALIUM) 5 MG tablet Take 1 tablet (5 mg total) by mouth every 8 (eight) hours as needed for muscle spasms. Do not take with other muscle spasm medicine or pain medication. 30 tablet 0  . docusate sodium (COLACE) 100 MG capsule Take 1 capsule (100 mg total) by mouth 2 (two) times daily. To prevent constipation while taking pain medication. (Patient taking differently: Take 100 mg by mouth daily as needed. To prevent constipation while taking pain medication.) 60 capsule 0  . ezetimibe (ZETIA) 10 MG tablet Take 10 mg by mouth every evening.     Miguel Miller HYDROmorphone (DILAUDID) 2 MG tablet Take 0.5-1 tablets (1-2 mg total) by mouth every 6 (six) hours as needed for moderate pain. 30 tablet 0  . psyllium (METAMUCIL) 58.6 % powder Take 1 packet by mouth 3 (three) times daily. 2 teaspoons in am    . rosuvastatin (CRESTOR) 5 MG tablet Take 5 mg by mouth daily. Takes once per week.    . tamsulosin (FLOMAX) 0.4 MG CAPS capsule Take 1 capsule (0.4 mg total) by mouth at bedtime. 30 capsule 2  . zolpidem (AMBIEN) 10 MG tablet Take 10 mg by mouth at bedtime as needed for sleep.     Miguel Miller lisinopril (PRINIVIL,ZESTRIL) 20 MG tablet Take 20 mg by mouth every evening.     . propranolol  (INDERAL) 10 MG tablet Take 10 mg by mouth daily as needed (tremors).    . gabapentin (NEURONTIN) 300 MG capsule Take 1 capsule (300 mg total) by mouth 3 (three) times daily. Start with one pill each evening for 3 days, then, one pill twice daily for 3 days, then, go to one pill 3 times daily.  If you feel groggy or off balance with a dose increase, reduce back to the previous for 3 days. 90 capsule 5  . meloxicam (MOBIC) 15 MG tablet      No facility-administered medications prior to visit.     Allergies:   Tramadol, Hydrocodone bitartrate [hydrocodone], Oxycodone, Statins, and Welchol [colesevelam hcl]   Social History   Socioeconomic History  .  Marital status: Married    Spouse name: Jeani Hawking  . Number of children: 2  . Years of education: 16+  . Highest education level: Not on file  Occupational History  . Occupation: retired Pharmacist, hospital  Tobacco Use  . Smoking status: Never Smoker  . Smokeless tobacco: Never Used  Vaping Use  . Vaping Use: Never used  Substance and Sexual Activity  . Alcohol use: Yes    Comment: rare  . Drug use: No  . Sexual activity: Not Currently  Other Topics Concern  . Not on file  Social History Narrative   Patient lives with his wife.   Patient has 2 children.   Patient is retired.   Patient drinks 1 glass of caffeine daily.   Patient has a college education.   Social Determinants of Health   Financial Resource Strain: Not on file  Food Insecurity: Not on file  Transportation Needs: Not on file  Physical Activity: Not on file  Stress: Not on file  Social Connections: Not on file     Family History:  The patient's family history includes Arthritis in his father; Bladder Cancer in his brother; Cancer in his mother; Diabetes in his brother; Emphysema in his father; Heart disease in his paternal grandfather; High blood pressure in his brother; Prostate cancer in his paternal grandfather.   ROS:   Please see the history of present illness.    All  other systems are reviewed and are negative.    PHYSICAL EXAM:   VS:  BP 140/64 (BP Location: Left Arm, Patient Position: Sitting, Cuff Size: Large)   Pulse 74   Ht 5\' 8"  (1.727 m)   Wt 222 lb (100.7 kg)   BMI 33.75 kg/m     General: Alert, oriented x3, no distress, obese Head: no evidence of trauma, PERRL, EOMI, no exophtalmos or lid lag, no myxedema, no xanthelasma; normal ears, nose and oropharynx Neck: normal jugular venous pulsations and no hepatojugular reflux; brisk carotid pulses without delay and no carotid bruits Chest: clear to auscultation, no signs of consolidation by percussion or palpation, normal fremitus, symmetrical and full respiratory excursions Cardiovascular: normal position and quality of the apical impulse, regular rhythm, normal first and second heart sounds, no murmurs, rubs or gallops Abdomen: no tenderness or distention, no masses by palpation, no abnormal pulsatility or arterial bruits, normal bowel sounds, no hepatosplenomegaly Extremities: no clubbing, cyanosis or edema; 2+ radial, ulnar and brachial pulses bilaterally; 2+ right femoral,1+ posterior tibial and dorsalis pedis pulses; 2+ left femoral, 1+posterior tibial and dorsalis pedis pulses; no subclavian or femoral bruits Neurological: grossly nonfocal Psych: Normal mood and affect   Wt Readings from Last 3 Encounters:  09/15/20 222 lb (100.7 kg)  09/15/20 222 lb (100.7 kg)  04/21/20 227 lb 6.4 oz (103.1 kg)      Studies/Labs Reviewed:   EKG:  EKG is ordered today and is entirely normal (NSR).   Recent Labs: 09/10/2020 hemoglobin A1c was 6.6%. Hgb 12.6, creatinine 1.3, normal liver function tests, potassium 4.8  Lipid Panel 09/10/2020 Total cholesterol 160, triglycerides 142, HDL 4465, LDL 89  Additional studies/ records that were reviewed today include:  Reviewed images from his CT angiogram, with substantial calcification in all 3 coronaries especially obvious in the proximal to mid LAD.  There is atherosclerosis in the distal aorta and proximal iliac arteries but at least in the segments viewed there does not appear to be any flow limiting stenosis.    ASSESSMENT:    1. Coronary  artery disease involving native coronary artery of native heart without angina pectoris   2. Essential hypertension   3. Echocardiogram shows left ventricular diastolic dysfunction   4. Vasculogenic erectile dysfunction, unspecified vasculogenic erectile dysfunction type   5. Diabetes mellitus type 2 in obese ([place])   6. Hypercholesterolemia   7. Moderate obesity      PLAN:  In order of problems listed above:  1. CAD: Denies current problems with angina. Has evidence of atherosclerosis, but normal perfusion in 2017. 2. CHF: Normal LVEF, grade I diastolic dysfunction on echo. No evidence for hypervolemia clinically..  3. Erectile dysfunction:  Did not improve off BP medications and no longer responds to direct injections. Normal ABIs make it unlikely that we will be able to perform a vascular procedure that could improve erectile dysfunction.  Discuss implants with his Urologist, I do not think there are other solutions. 4. HTN: fair control without meds. Wait to decide on long term plan until he finishes the radiation therapy. 5. DM type 2: Controlled with diet alone 6. HLP: He reports being tried on 6 different statins which all produced side effects. Taking rosuvastatin weekly and ezetimibe with acceptable lipid profile, especially since he does not have symptomatic CAD or PAD. 7. Obesity: he has lost about 25 lb per his report (16 lb on my review), which probably explains why he no longer needs BP meds. Remains mildly-moderately obese.  Medication Adjustments/Labs and Tests Ordered: Current medicines are reviewed at length with the patient today.  Concerns regarding medicines are outlined above.  Medication changes, Labs and Tests ordered today are listed in the Patient Instructions  below. Patient Instructions  Medication Instructions:  No changes *If you need a refill on your cardiac medications before your next appointment, please call your pharmacy*   Lab Work: No changes If you have labs (blood work) drawn today and your tests are completely normal, you will receive your results only by: Miguel Miller MyChart Message (if you have MyChart) OR . A paper copy in the mail If you have any lab test that is abnormal or we need to change your treatment, we will call you to review the results.   Testing/Procedures: None ordered   Follow-Up: At [facility], you and your health needs are our priority.  As part of our continuing mission to provide you with exceptional heart care, we have created designated Provider Care Teams.  These Care Teams include your primary Cardiologist (physician) and Advanced Practice Providers (APPs -  Physician Assistants and Nurse Practitioners) who all work together to provide you with the care you need, when you need it.  We recommend signing up for the patient portal called "MyChart".  Sign up information is provided on this After Visit Summary.  MyChart is used to connect with patients for Virtual Visits (Telemedicine).  Patients are able to view lab/test results, encounter notes, upcoming appointments, etc.  Non-urgent messages can be sent to your provider as well.   To learn more about what you can do with MyChart, go to [email or web address].    Your next appointment:   12 month(s)  The format for your next appointment:   In Person  Provider:   You may see Sanda Klein, MD or one of the following Advanced Practice Providers on your designated Care Team:    Almyra Deforest, PA-C  Fabian Sharp, [place] or   Roby Lofts, PA-C     Signed, Sanda Klein, MD  09/20/2020 12:58 PM    [facility] [place], [place], [place]  [address] Phone: [phone]; Fax: [phone]

## 2020-09-15 NOTE — Progress Notes (Signed)
Subjective:    Patient ID: Miguel Miller, male    DOB: [birth year], 80 y.o.   MRN: [id number]  HPI Pt returns for f/u of multinodular goiter (dx'ed 2017, incidentally on CT; he reported diaphoresis--urine catechols and calcitonin were normal; bx of dominant LLP nodule was Beth cat 2; f/u US is 2018 showed enlargement, but 2019 was stable).  pt states he feels well in general.  He does not notice the goiter.   Past Medical History:  Diagnosis Date  . Actinic keratoses   . Arteriosclerosis   . BPH (benign prostatic hyperplasia)   . Diabetes mellitus type 2, controlled, without complications ([place])    diet controlled  . Diverticulosis   . DJD (degenerative joint disease)   . Dyslipidemia   . Eczema   . ED (erectile dysfunction)   . Essential tremor   . GERD (gastroesophageal reflux disease)   . Headache   . Hypertension   . Low testosterone   . Prostate cancer ([place]) 11/2019  . Retinal detachment of left eye with single retinal tear yrs ago  . Thrombocytopenia ([place])   . Thyroid goiter    worked up for 3 -4 yrs ago no tx for    Past Surgical History:  Procedure Laterality Date  . ANTERIOR CERVICAL DECOMP/DISCECTOMY FUSION N/A 11/28/2019   Procedure: Anterior Cervical Decompression Fusion - Cervical Three - Cervical Four;  Surgeon: Earnie Larsson, MD;  Location: [facility];  Service: Neurosurgery;  Laterality: N/A;  Cervical Three  . BACK SURGERY     LUMBAR  2-3   03/2011  . BACK SURGERY     Lami/microdiscectomy 02/13/18 - Dr. Earnie Larsson  . BIOPSY THYROID    . c-spine  2001   Dr. Annette Stable plates and screws  . CATARACT EXTRACTION  1997  . CATARACT EXTRACTION     LEFT   1998  . CYSTOSCOPY N/A 03/29/2020   Procedure: CYSTOSCOPY FLEXIBLE;  Surgeon: Raynelle Bring, MD;  Location: [facility];  Service: Urology;  Laterality: N/A;  . fusion L2  2012   Dr. Annette Stable  . HEMORRHOIDECTOMY WITH HEMORRHOID BANDING  30 yrs ago  . KNEE SURGERY Bilateral 2005   Dr. Berenice Primas knee replacement   . l-spine  2003   Dr. Annette Stable  . laser surgery for holes in retina Right 08/2012  . outpatient right shoulder  2008   Dr. Percell Miller  . PROSTATE SURGERY     09/2016  LASER dr Gaynelle Arabian  . PYLOROMYOTOMY     1944  . RADIOACTIVE SEED IMPLANT N/A 03/29/2020   Procedure: RADIOACTIVE SEED IMPLANT/BRACHYTHERAPY IMPLANT;  Surgeon: Raynelle Bring, MD;  Location: [facility];  Service: Urology;  Laterality: N/A;  . RETINAL DETACHMENT SURGERY Right 1996   Dr. Glennon Mac  . retineal tear Left 11/2012   Dr. Darrick Grinder  . SPACE OAR INSTILLATION N/A 03/29/2020   Procedure: SPACE OAR INSTILLATION;  Surgeon: Raynelle Bring, MD;  Location: [facility];  Service: Urology;  Laterality: N/A;  . SPINAL FUSION     Pedicle screw and interbody fusion L2-3, L3-4, and L4-5  . TOTAL HIP ARTHROPLASTY Left 06/05/2017   dr tim  Percell Miller  . TOTAL HIP ARTHROPLASTY Left 06/05/2017   Procedure: TOTAL HIP ARTHROPLASTY ANTERIOR APPROACH;  Surgeon: Renette Butters, MD;  Location: [place];  Service: Orthopedics;  Laterality: Left;  [name] VASECTOMY      Social History   Socioeconomic History  . Marital status: Married    Spouse name: Jeani Hawking  .  Number of children: 2  . Years of education: 16+  . Highest education level: Not on file  Occupational History  . Occupation: retired Pharmacist, hospital  Tobacco Use  . Smoking status: Never Smoker  . Smokeless tobacco: Never Used  Vaping Use  . Vaping Use: Never used  Substance and Sexual Activity  . Alcohol use: Yes    Comment: rare  . Drug use: No  . Sexual activity: Not Currently  Other Topics Concern  . Not on file  Social History Narrative   Patient lives with his wife.   Patient has 2 children.   Patient is retired.   Patient drinks 1 glass of caffeine daily.   Patient has a college education.   Social Determinants of Health   Financial Resource Strain: Not on file  Food Insecurity: Not on file  Transportation Needs: Not on file  Physical Activity:  Not on file  Stress: Not on file  Social Connections: Not on file  Intimate Partner Violence: Not on file    Current Outpatient Medications on File Prior to Visit  Medication Sig Dispense Refill  . acetaminophen (TYLENOL) 500 MG tablet Take 1,000 mg by mouth every 6 (six) hours as needed for moderate pain or headache.     . diazepam (VALIUM) 5 MG tablet Take 1 tablet (5 mg total) by mouth every 8 (eight) hours as needed for muscle spasms. Do not take with other muscle spasm medicine or pain medication. 30 tablet 0  . docusate sodium (COLACE) 100 MG capsule Take 1 capsule (100 mg total) by mouth 2 (two) times daily. To prevent constipation while taking pain medication. (Patient taking differently: Take 100 mg by mouth daily as needed. To prevent constipation while taking pain medication.) 60 capsule 0  . ezetimibe (ZETIA) 10 MG tablet Take 10 mg by mouth every evening.     [name] HYDROmorphone (DILAUDID) 2 MG tablet Take 0.5-1 tablets (1-2 mg total) by mouth every 6 (six) hours as needed for moderate pain. 30 tablet 0  . psyllium (METAMUCIL) 58.6 % powder Take 1 packet by mouth 3 (three) times daily. 2 teaspoons in am    . rosuvastatin (CRESTOR) 5 MG tablet Take 5 mg by mouth daily. Takes once per week.    . tamsulosin (FLOMAX) 0.4 MG CAPS capsule Take 1 capsule (0.4 mg total) by mouth at bedtime. 30 capsule 2  . zolpidem (AMBIEN) 10 MG tablet Take 10 mg by mouth at bedtime as needed for sleep.      No current facility-administered medications on file prior to visit.    Allergies  Allergen Reactions  . Tramadol Other (See Comments)    Hallucinations -He thinks-just remembered had a problem  . Hydrocodone Bitartrate [Hydrocodone] Itching and Other (See Comments)    Hives,  keeps awake  . Oxycodone Hives    Keeps awake, likes hydromorphone  . Statins Other (See Comments)    Muscle aches   . Welchol [Colesevelam Hcl] Other (See Comments)    constipation    Family History  Problem Relation  Age of Onset  . Emphysema Father   . Arthritis Father   . Cancer Mother        GYN  . Diabetes Brother   . High blood pressure Brother   . Bladder Cancer Brother   . Heart disease Paternal Grandfather   . Prostate cancer Paternal Grandfather   . Thyroid nodules Neg Hx   . Colon cancer Neg Hx   . Pancreatic cancer Neg  Hx   . Breast cancer Neg Hx     BP 120/88   Pulse 78   Ht 5\' 8"  (1.727 m)   Wt 222 lb (100.7 kg)   SpO2 99%   BMI 33.75 kg/m    Review of Systems     Objective:   Physical Exam VS: see vs page.  GEN: no distress.   NECK: a healed scar is present (C-spine procedure); there is no palpable thyroid enlargement.  No thyroid nodule is palpable.  No palpable lymphadenopathy at the anterior neck.    outside test results are reviewed: TSH=normal    Assessment & Plan:  MNG: due for recheck.   Patient Instructions  Let's recheck the ultrasound.  you will receive a phone call, about a day and time for an appointment. Please come back for a follow-up appointment in 1-2 years.

## 2020-09-15 NOTE — Patient Instructions (Signed)
Medication Instructions:  No changes *If you need a refill on your cardiac medications before your next appointment, please call your pharmacy*   Lab Work: No changes If you have labs (blood work) drawn today and your tests are completely normal, you will receive your results only by: [name] MyChart Message (if you have MyChart) OR . A paper copy in the mail If you have any lab test that is abnormal or we need to change your treatment, we will call you to review the results.   Testing/Procedures: None ordered   Follow-Up: At [facility], you and your health needs are our priority.  As part of our continuing mission to provide you with exceptional heart care, we have created designated Provider Care Teams.  These Care Teams include your primary Cardiologist (physician) and Advanced Practice Providers (APPs -  Physician Assistants and Nurse Practitioners) who all work together to provide you with the care you need, when you need it.  We recommend signing up for the patient portal called "MyChart".  Sign up information is provided on this After Visit Summary.  MyChart is used to connect with patients for Virtual Visits (Telemedicine).  Patients are able to view lab/test results, encounter notes, upcoming appointments, etc.  Non-urgent messages can be sent to your provider as well.   To learn more about what you can do with MyChart, go to [email or web address].    Your next appointment:   12 month(s)  The format for your next appointment:   In Person  Provider:   You may see Sanda Klein, MD or one of the following Advanced Practice Providers on your designated Care Team:    Almyra Deforest, PA-C  Fabian Sharp, PA-C or   Roby Lofts, [place]

## 2020-09-15 NOTE — Patient Instructions (Signed)
Let's recheck the ultrasound.  you will receive a phone call, about a day and time for an appointment. Please come back for a follow-up appointment in 1-2 years.

## 2020-09-16 ENCOUNTER — Other Ambulatory Visit: Payer: Self-pay

## 2020-09-16 ENCOUNTER — Ambulatory Visit
Admission: RE | Admit: 2020-09-16 | Discharge: 2020-09-16 | Disposition: A | Discharge status: Home / Self Care | Payer: Medicare PPO | Source: Ambulatory Visit | Attending: Radiation Oncology | Admitting: Radiation Oncology

## 2020-09-16 DIAGNOSIS — C61 Malignant neoplasm of prostate: Secondary | ICD-10-CM | POA: Diagnosis not present

## 2020-09-17 ENCOUNTER — Ambulatory Visit
Admission: RE | Admit: 2020-09-17 | Discharge: 2020-09-17 | Disposition: A | Discharge status: Home / Self Care | Payer: Medicare PPO | Source: Ambulatory Visit | Attending: Radiation Oncology | Admitting: Radiation Oncology

## 2020-09-17 ENCOUNTER — Other Ambulatory Visit: Payer: Self-pay

## 2020-09-17 DIAGNOSIS — C61 Malignant neoplasm of prostate: Secondary | ICD-10-CM | POA: Diagnosis not present

## 2020-09-20 ENCOUNTER — Ambulatory Visit
Admission: RE | Admit: 2020-09-20 | Discharge: 2020-09-20 | Disposition: A | Discharge status: Home / Self Care | Payer: Medicare PPO | Source: Ambulatory Visit | Attending: Radiation Oncology | Admitting: Radiation Oncology

## 2020-09-20 ENCOUNTER — Other Ambulatory Visit: Payer: Self-pay

## 2020-09-20 DIAGNOSIS — C61 Malignant neoplasm of prostate: Secondary | ICD-10-CM | POA: Diagnosis not present

## 2020-09-21 ENCOUNTER — Ambulatory Visit
Admission: RE | Admit: 2020-09-21 | Discharge: 2020-09-21 | Disposition: A | Discharge status: Home / Self Care | Payer: Medicare PPO | Source: Ambulatory Visit | Attending: Radiation Oncology | Admitting: Radiation Oncology

## 2020-09-21 ENCOUNTER — Other Ambulatory Visit: Payer: Self-pay

## 2020-09-21 DIAGNOSIS — C61 Malignant neoplasm of prostate: Secondary | ICD-10-CM | POA: Diagnosis not present

## 2020-09-22 ENCOUNTER — Ambulatory Visit
Admission: RE | Admit: 2020-09-22 | Discharge: 2020-09-22 | Disposition: A | Discharge status: Home / Self Care | Payer: Medicare PPO | Source: Ambulatory Visit | Attending: Radiation Oncology | Admitting: Radiation Oncology

## 2020-09-22 ENCOUNTER — Other Ambulatory Visit: Payer: Self-pay

## 2020-09-22 DIAGNOSIS — C61 Malignant neoplasm of prostate: Secondary | ICD-10-CM | POA: Diagnosis not present

## 2020-09-23 ENCOUNTER — Ambulatory Visit
Admission: RE | Admit: 2020-09-23 | Discharge: 2020-09-23 | Disposition: A | Discharge status: Home / Self Care | Payer: Medicare PPO | Source: Ambulatory Visit | Attending: Radiation Oncology | Admitting: Radiation Oncology

## 2020-09-23 DIAGNOSIS — C61 Malignant neoplasm of prostate: Secondary | ICD-10-CM | POA: Diagnosis not present

## 2020-09-24 ENCOUNTER — Encounter: Payer: Self-pay | Admitting: Radiation Oncology

## 2020-09-24 ENCOUNTER — Ambulatory Visit: Payer: Medicare PPO

## 2020-09-24 ENCOUNTER — Ambulatory Visit
Admission: RE | Admit: 2020-09-24 | Discharge: 2020-09-24 | Disposition: A | Discharge status: Home / Self Care | Payer: Medicare PPO | Source: Ambulatory Visit | Attending: Radiation Oncology | Admitting: Radiation Oncology

## 2020-09-24 ENCOUNTER — Other Ambulatory Visit: Payer: Self-pay

## 2020-09-24 DIAGNOSIS — C61 Malignant neoplasm of prostate: Secondary | ICD-10-CM | POA: Diagnosis not present

## 2020-09-24 DIAGNOSIS — R590 Localized enlarged lymph nodes: Secondary | ICD-10-CM | POA: Diagnosis not present

## 2020-09-27 ENCOUNTER — Ambulatory Visit: Payer: Medicare PPO

## 2020-09-30 ENCOUNTER — Encounter: Payer: Self-pay | Admitting: Urology

## 2020-10-01 DIAGNOSIS — R350 Frequency of micturition: Secondary | ICD-10-CM | POA: Diagnosis not present

## 2020-10-01 DIAGNOSIS — N401 Enlarged prostate with lower urinary tract symptoms: Secondary | ICD-10-CM | POA: Diagnosis not present

## 2020-10-01 DIAGNOSIS — C61 Malignant neoplasm of prostate: Secondary | ICD-10-CM | POA: Diagnosis not present

## 2020-10-04 NOTE — Progress Notes (Signed)
  Radiation Oncology         [phone] ________________________________  Name: Miguel Miller MRN: [id number]  Date: 09/24/2020  DOB: Jul 02, 1941  End of Treatment Note  Diagnosis:   80 y.o. gentleman with Stage T1c adenocarcinoma of the prostate with Gleason score of 3+4, and PSA of 8.38     Indication for treatment:  Curative, Definitive Radiotherapy       Radiation treatment dates:   08/16/20-09/24/20  Site/dose:   The prostate was treated to 70 Gy in 28 fractions of 2.5 Gy  Beams/energy:   The patient was treated with IMRT using volumetric arc therapy delivering 6 MV X-rays to clockwise and counterclockwise circumferential arcs with a 90 degree collimator offset to avoid dose scalloping.  Image guidance was performed with daily cone beam CT prior to each fraction to align to gold markers in the prostate and assure proper bladder and rectal fill volumes.  Immobilization was achieved with BodyFix custom mold.  Narrative: The patient tolerated radiation treatment relatively well.   The patient experienced some minor urinary irritation and modest fatigue.  He has had a perirectal fistula which remained stable through the course of radiation therapy.  Plan: The patient has completed radiation treatment. He will return to radiation oncology clinic for routine followup in one month. I advised him to call or return sooner if he has any questions or concerns related to his recovery or treatment. ________________________________  Sheral Apley. Tammi Klippel, M.D.

## 2020-10-11 ENCOUNTER — Ambulatory Visit (HOSPITAL_BASED_OUTPATIENT_CLINIC_OR_DEPARTMENT_OTHER): Payer: Medicare PPO

## 2020-10-13 ENCOUNTER — Ambulatory Visit (HOSPITAL_BASED_OUTPATIENT_CLINIC_OR_DEPARTMENT_OTHER): Payer: Medicare PPO

## 2020-10-15 ENCOUNTER — Ambulatory Visit: Payer: Medicare PPO

## 2020-10-18 ENCOUNTER — Ambulatory Visit (HOSPITAL_BASED_OUTPATIENT_CLINIC_OR_DEPARTMENT_OTHER)
Admission: RE | Admit: 2020-10-18 | Discharge: 2020-10-18 | Disposition: A | Discharge status: Home / Self Care | Payer: Medicare PPO | Source: Ambulatory Visit | Attending: Endocrinology | Admitting: Endocrinology

## 2020-10-18 ENCOUNTER — Other Ambulatory Visit: Payer: Self-pay

## 2020-10-18 ENCOUNTER — Telehealth: Payer: Self-pay

## 2020-10-18 DIAGNOSIS — E042 Nontoxic multinodular goiter: Secondary | ICD-10-CM | POA: Diagnosis not present

## 2020-10-18 NOTE — Progress Notes (Signed)
Patient has telephone visit with Ashlyn Bruning PA. Patient states nocturia 4 times per night. Patient states mild dysuria. Patient states having constipation. Patient states urine stream is moderate and intermittent. Patient states that he is not emptying his bladder completely. Patient states urgency and is able to hold urine. Patient states having to push to start urination. Patient states mild fatigue. Patient states having leakage post void. Patient states taking Tamsulosin as directed. Patient states having another follow-up visit with [facility] in May.

## 2020-10-18 NOTE — Telephone Encounter (Signed)
Spoke with patient in regards to telephone visit with Freeman Caldron PA on 10/27/20 @ 8:30am. Patient verbalized understanding of appointment date and time. Reviewed Medications, AUA and prostate questions. TM

## 2020-10-18 NOTE — Telephone Encounter (Signed)
Left voicemail message in regards to telephone visit with Freeman Caldron PA on 10/27/20. Called to review medications, AUA and prostate questions. TM

## 2020-10-27 ENCOUNTER — Other Ambulatory Visit: Payer: Self-pay

## 2020-10-27 ENCOUNTER — Ambulatory Visit
Admission: RE | Admit: 2020-10-27 | Discharge: 2020-10-27 | Disposition: A | Discharge status: Home / Self Care | Payer: Medicare PPO | Source: Ambulatory Visit | Attending: Urology | Admitting: Urology

## 2020-10-27 DIAGNOSIS — C61 Malignant neoplasm of prostate: Secondary | ICD-10-CM

## 2020-10-27 NOTE — Progress Notes (Signed)
Radiation Oncology         [phone] ________________________________  Name: Miguel Miller MRN: [id number]  Date: 10/27/2020  DOB: 04-13-1941  Post Treatment Note  CC: [name], MD  [name], MD  Diagnosis:   80 y.o. gentleman with Stage T1c adenocarcinoma of the prostate with Gleason score of 3+4, and PSA of 8.38  Interval Since Last Radiation:  4.5 weeks  08/16/20-09/24/20:   The prostate was treated to 70 Gy in 28 fractions of 2.5 Gy, following an unsuccessful brachytherapy procedure.  03/29/20:  Insertion of radioactive I-125 seeds into the prostate gland; 145 Gy, definitive/boost therapy with placement of SpaceOAR VUE gel. Results:   Prostate Coverage - The dose of radiation delivered to the 90% or more of the prostate gland (D90) was 6% of the prescription dose. This falls significantly short of our goal of greater than 90%. Rectal Sparing - The volume of rectal tissue receiving the prescription dose or higher was 0.0 cc. This falls under our thresholds tolerance of 1.0 cc.  Narrative: I spoke with the patient to conduct his routine scheduled 1 month follow up visit via telephone to spare the patient unnecessary potential exposure in the healthcare setting during the current COVID-19 pandemic.  The patient was notified in advance and gave permission to proceed with this visit format.  He tolerated radiation treatment relatively well with only minor urinary irritation and modest fatigue.  He has had a perirectal abcess which remained stable through the course of radiation therapy.                              On review of systems, the patient states that he is doing well in general.  His current IPSS score is 19, indicating moderate to severe urinary symptoms with nocturia x4, hesitancy, intermittency, urgency, mild dysuria and incomplete bladder emptying.  He has recently resumed taking Flomax daily along with Myrbetriq samples that were provided by Dr. Alinda Money and he  feels that this has helped significantly.  He reports much improvement in the flow of stream, ability to empty his bladder, decreased hesitancy and no further dysuria.  He reports a healthy appetite and is maintaining his weight.  He reports gradual improvement in his bowel symptoms, currently with occasional constipation which is managed with stool softeners as needed.  He has had intermittent twinges of right lower quadrant abdominal discomfort but denies fevers, nausea or vomiting.  He continues with moderate fatigue but feels that this is also gradually improving.  Overall, he is pleased with his progress to date.  ALLERGIES:  is allergic to tramadol, hydrocodone bitartrate [hydrocodone], oxycodone, statins, and welchol [colesevelam hcl].  Meds: Current Outpatient Medications  Medication Sig Dispense Refill  . acetaminophen (TYLENOL) 500 MG tablet Take 1,000 mg by mouth every 6 (six) hours as needed for moderate pain or headache.     . diazepam (VALIUM) 5 MG tablet Take 1 tablet (5 mg total) by mouth every 8 (eight) hours as needed for muscle spasms. Do not take with other muscle spasm medicine or pain medication. 30 tablet 0  . docusate sodium (COLACE) 100 MG capsule Take 1 capsule (100 mg total) by mouth 2 (two) times daily. To prevent constipation while taking pain medication. (Patient taking differently: Take 100 mg by mouth daily as needed. To prevent constipation while taking pain medication.) 60 capsule 0  . ezetimibe (ZETIA) 10 MG tablet Take 10 mg by mouth  every evening.     [name] HYDROmorphone (DILAUDID) 2 MG tablet Take 0.5-1 tablets (1-2 mg total) by mouth every 6 (six) hours as needed for moderate pain. 30 tablet 0  . psyllium (METAMUCIL) 58.6 % powder Take 1 packet by mouth 3 (three) times daily. 2 teaspoons in am    . rosuvastatin (CRESTOR) 5 MG tablet Take 5 mg by mouth daily. Takes once per week.    . tamsulosin (FLOMAX) 0.4 MG CAPS capsule Take 1 capsule (0.4 mg total) by mouth at  bedtime. 30 capsule 2  . zolpidem (AMBIEN) 10 MG tablet Take 10 mg by mouth at bedtime as needed for sleep.      No current facility-administered medications for this encounter.    Physical Findings:  vitals were not taken for this visit.   /Unable to assess due to telephone follow-up visit format.  Lab Findings: Lab Results  Component Value Date   WBC 5.2 03/25/2020   HGB 14.1 03/25/2020   HCT 43.0 03/25/2020   MCV 96.0 03/25/2020   PLT 154 03/25/2020     Radiographic Findings: US THYROID  Result Date: 10/18/2020 CLINICAL DATA:  Follow-up thyroid nodules. Left inferior thyroid nodule was biopsied in 2017. Biopsy was compatible with a benign follicular nodule. Palpable fullness in the left submandibular region. EXAM: THYROID ULTRASOUND TECHNIQUE: Ultrasound examination of the thyroid gland and adjacent soft tissues was performed. COMPARISON:  01/18/2018 FINDINGS: Parenchymal Echotexture: Mildly heterogenous Isthmus: 0.2 cm, previously 0.3 cm Right lobe: 4.8 x 2.2 x 2.3 cm, previously 4.3 x 1.7 x 2.3 cm Left lobe: 7.0 x 1.9 x 1.7 cm, 05.6 x 2.2 x 2.3 cm _________________________________________________________ Estimated total number of nodules >/= 1 cm: 1 Number of spongiform nodules >/=  2 cm not described below (TR1): 0 Number of mixed cystic and solid nodules >/= 1.5 cm not described below (TR2): 0 _________________________________________________________ Small nodules in the right thyroid lobe. Largest right thyroid nodule is probably cystic and measures 0.9 cm and previously measured 0.7 cm. Previously biopsied nodule in the left inferior thyroid lobe is heterogeneous and poorly defined. This nodule measures 3.0 x 2.9 x 3.5 cm and previously measured 2.0 x 2.0 x 1.8 cm. No focal abnormality at the area of concern in the left submandibular region. Normal appearance of the visualized left submandibular gland. Normal appearance of the right submandibular region. IMPRESSION: 1. Bilateral  thyroid nodules. The previously biopsied left thyroid nodule is poorly defined but probably enlarged since 2019. 2. No focal abnormality at the area of concern in the left submandibular region. 3. No new suspicious thyroid nodules. The above is in keeping with the ACR TI-RADS recommendations - J Am Coll Radiol 2017;14:587-595. Electronically Signed   By: Markus Daft M.D.   On: 10/18/2020 13:22    Impression/Plan: 1. 80 y.o. gentleman with Stage T1c adenocarcinoma of the prostate with Gleason score of 3+4, and PSA of 8.38. He will continue to follow up with urology for ongoing PSA determinations and has an appointment scheduled with Dr. Alinda Money on 01/05/2021. He understands what to expect with regards to PSA monitoring going forward. I will look forward to following his response to treatment via correspondence with urology, and would be happy to continue to participate in his care if clinically indicated. I talked to the patient about what to expect in the future, including his risk for erectile dysfunction and rectal bleeding. I encouraged him to call or return to the office if he has any questions regarding his previous  radiation or possible radiation side effects. He was comfortable with this plan and will follow up as needed.    Nicholos Johns, PA-C

## 2020-11-13 ENCOUNTER — Other Ambulatory Visit: Payer: Self-pay | Admitting: Urology

## 2020-12-29 DIAGNOSIS — C61 Malignant neoplasm of prostate: Secondary | ICD-10-CM | POA: Diagnosis not present

## 2021-01-05 DIAGNOSIS — E291 Testicular hypofunction: Secondary | ICD-10-CM | POA: Diagnosis not present

## 2021-01-05 DIAGNOSIS — N5201 Erectile dysfunction due to arterial insufficiency: Secondary | ICD-10-CM | POA: Diagnosis not present

## 2021-01-05 DIAGNOSIS — C61 Malignant neoplasm of prostate: Secondary | ICD-10-CM | POA: Diagnosis not present

## 2021-01-27 DIAGNOSIS — D229 Melanocytic nevi, unspecified: Secondary | ICD-10-CM | POA: Diagnosis not present

## 2021-01-27 DIAGNOSIS — L853 Xerosis cutis: Secondary | ICD-10-CM | POA: Diagnosis not present

## 2021-01-27 DIAGNOSIS — D1801 Hemangioma of skin and subcutaneous tissue: Secondary | ICD-10-CM | POA: Diagnosis not present

## 2021-01-27 DIAGNOSIS — L819 Disorder of pigmentation, unspecified: Secondary | ICD-10-CM | POA: Diagnosis not present

## 2021-01-27 DIAGNOSIS — L570 Actinic keratosis: Secondary | ICD-10-CM | POA: Diagnosis not present

## 2021-01-27 DIAGNOSIS — L821 Other seborrheic keratosis: Secondary | ICD-10-CM | POA: Diagnosis not present

## 2021-01-27 DIAGNOSIS — L814 Other melanin hyperpigmentation: Secondary | ICD-10-CM | POA: Diagnosis not present

## 2021-03-09 DIAGNOSIS — I10 Essential (primary) hypertension: Secondary | ICD-10-CM | POA: Diagnosis not present

## 2021-03-09 DIAGNOSIS — N1831 Chronic kidney disease, stage 3a: Secondary | ICD-10-CM | POA: Diagnosis not present

## 2021-03-09 DIAGNOSIS — Z8546 Personal history of malignant neoplasm of prostate: Secondary | ICD-10-CM | POA: Diagnosis not present

## 2021-03-09 DIAGNOSIS — E118 Type 2 diabetes mellitus with unspecified complications: Secondary | ICD-10-CM | POA: Diagnosis not present

## 2021-03-09 DIAGNOSIS — E785 Hyperlipidemia, unspecified: Secondary | ICD-10-CM | POA: Diagnosis not present

## 2021-03-09 DIAGNOSIS — E042 Nontoxic multinodular goiter: Secondary | ICD-10-CM | POA: Diagnosis not present

## 2021-03-09 DIAGNOSIS — E291 Testicular hypofunction: Secondary | ICD-10-CM | POA: Diagnosis not present

## 2021-03-09 DIAGNOSIS — Z0000 Encounter for general adult medical examination without abnormal findings: Secondary | ICD-10-CM | POA: Diagnosis not present

## 2021-03-09 DIAGNOSIS — N529 Male erectile dysfunction, unspecified: Secondary | ICD-10-CM | POA: Diagnosis not present

## 2021-03-09 DIAGNOSIS — R809 Proteinuria, unspecified: Secondary | ICD-10-CM | POA: Diagnosis not present

## 2021-04-05 DIAGNOSIS — H2511 Age-related nuclear cataract, right eye: Secondary | ICD-10-CM | POA: Diagnosis not present

## 2021-04-05 DIAGNOSIS — E119 Type 2 diabetes mellitus without complications: Secondary | ICD-10-CM | POA: Diagnosis not present

## 2021-04-05 DIAGNOSIS — H31003 Unspecified chorioretinal scars, bilateral: Secondary | ICD-10-CM | POA: Diagnosis not present

## 2021-04-05 DIAGNOSIS — H524 Presbyopia: Secondary | ICD-10-CM | POA: Diagnosis not present

## 2021-04-07 DIAGNOSIS — E291 Testicular hypofunction: Secondary | ICD-10-CM | POA: Diagnosis not present

## 2021-06-20 DIAGNOSIS — Z8546 Personal history of malignant neoplasm of prostate: Secondary | ICD-10-CM | POA: Diagnosis not present

## 2021-06-20 DIAGNOSIS — R102 Pelvic and perineal pain: Secondary | ICD-10-CM | POA: Diagnosis not present

## 2021-06-20 DIAGNOSIS — Z923 Personal history of irradiation: Secondary | ICD-10-CM | POA: Diagnosis not present

## 2021-06-22 ENCOUNTER — Other Ambulatory Visit: Payer: Self-pay | Admitting: Family Medicine

## 2021-06-22 DIAGNOSIS — R102 Pelvic and perineal pain: Secondary | ICD-10-CM

## 2021-07-01 ENCOUNTER — Other Ambulatory Visit: Payer: Self-pay | Admitting: Family Medicine

## 2021-07-01 DIAGNOSIS — R102 Pelvic and perineal pain: Secondary | ICD-10-CM

## 2021-07-18 ENCOUNTER — Ambulatory Visit
Admission: RE | Admit: 2021-07-18 | Discharge: 2021-07-18 | Disposition: A | Discharge status: Home / Self Care | Payer: Medicare PPO | Source: Ambulatory Visit | Attending: Family Medicine | Admitting: Family Medicine

## 2021-07-18 DIAGNOSIS — Z96642 Presence of left artificial hip joint: Secondary | ICD-10-CM | POA: Diagnosis not present

## 2021-07-18 DIAGNOSIS — Z981 Arthrodesis status: Secondary | ICD-10-CM | POA: Diagnosis not present

## 2021-07-18 DIAGNOSIS — R102 Pelvic and perineal pain: Secondary | ICD-10-CM

## 2021-07-18 MED ORDER — IOPAMIDOL (ISOVUE-300) INJECTION 61%
100.0000 mL | Freq: Once | INTRAVENOUS | Status: AC | PRN
  Administered 2021-07-18: 100 mL via INTRAVENOUS

## 2021-07-22 DIAGNOSIS — R102 Pelvic and perineal pain: Secondary | ICD-10-CM | POA: Diagnosis not present

## 2021-07-22 DIAGNOSIS — R300 Dysuria: Secondary | ICD-10-CM | POA: Diagnosis not present

## 2021-07-22 DIAGNOSIS — R351 Nocturia: Secondary | ICD-10-CM | POA: Diagnosis not present

## 2021-08-11 DIAGNOSIS — I10 Essential (primary) hypertension: Secondary | ICD-10-CM | POA: Diagnosis not present

## 2021-08-11 DIAGNOSIS — M48061 Spinal stenosis, lumbar region without neurogenic claudication: Secondary | ICD-10-CM | POA: Diagnosis not present

## 2021-08-11 DIAGNOSIS — Z6836 Body mass index (BMI) 36.0-36.9, adult: Secondary | ICD-10-CM | POA: Diagnosis not present

## 2021-08-12 DIAGNOSIS — R102 Pelvic and perineal pain: Secondary | ICD-10-CM | POA: Diagnosis not present

## 2021-08-12 DIAGNOSIS — R739 Hyperglycemia, unspecified: Secondary | ICD-10-CM | POA: Diagnosis not present

## 2021-08-12 DIAGNOSIS — E785 Hyperlipidemia, unspecified: Secondary | ICD-10-CM | POA: Diagnosis not present

## 2021-08-12 DIAGNOSIS — Z923 Personal history of irradiation: Secondary | ICD-10-CM | POA: Diagnosis not present

## 2021-08-12 DIAGNOSIS — Z8546 Personal history of malignant neoplasm of prostate: Secondary | ICD-10-CM | POA: Diagnosis not present

## 2021-08-12 DIAGNOSIS — I10 Essential (primary) hypertension: Secondary | ICD-10-CM | POA: Diagnosis not present

## 2021-08-24 DIAGNOSIS — M5416 Radiculopathy, lumbar region: Secondary | ICD-10-CM | POA: Diagnosis not present

## 2021-09-07 DIAGNOSIS — C61 Malignant neoplasm of prostate: Secondary | ICD-10-CM | POA: Diagnosis not present

## 2021-09-07 DIAGNOSIS — R300 Dysuria: Secondary | ICD-10-CM | POA: Diagnosis not present

## 2021-09-07 DIAGNOSIS — E291 Testicular hypofunction: Secondary | ICD-10-CM | POA: Diagnosis not present

## 2021-09-14 DIAGNOSIS — E291 Testicular hypofunction: Secondary | ICD-10-CM | POA: Diagnosis not present

## 2021-09-14 DIAGNOSIS — C61 Malignant neoplasm of prostate: Secondary | ICD-10-CM | POA: Diagnosis not present

## 2021-09-14 DIAGNOSIS — N401 Enlarged prostate with lower urinary tract symptoms: Secondary | ICD-10-CM | POA: Diagnosis not present

## 2021-09-14 DIAGNOSIS — R300 Dysuria: Secondary | ICD-10-CM | POA: Diagnosis not present

## 2021-09-14 DIAGNOSIS — N5201 Erectile dysfunction due to arterial insufficiency: Secondary | ICD-10-CM | POA: Diagnosis not present

## 2021-09-14 DIAGNOSIS — R350 Frequency of micturition: Secondary | ICD-10-CM | POA: Diagnosis not present

## 2021-09-15 ENCOUNTER — Ambulatory Visit: Payer: Medicare PPO | Admitting: Endocrinology

## 2021-09-15 ENCOUNTER — Other Ambulatory Visit: Payer: Self-pay

## 2021-09-15 VITALS — BP 100/50 | HR 84 | Ht 68.0 in | Wt 241.2 lb

## 2021-09-15 DIAGNOSIS — E042 Nontoxic multinodular goiter: Secondary | ICD-10-CM | POA: Diagnosis not present

## 2021-09-15 NOTE — Progress Notes (Signed)
Subjective:    Patient ID: Miguel Miller, male    DOB: Sep 20, 1940, 81 y.o.   MRN: [id number]  HPI Pt returns for f/u of multinodular goiter (dx'ed 2017, incidentally on CT; he reported diaphoresis--urine catechols and calcitonin were normal; bx of dominant LLP nodule was Beth cat 2; f/u US is 2018 showed enlargement, but 2019 was stable;  in 2022: previously biopsied left thyroid nodule is poorly defined but probably enlarged since 2019).  pt states he feels well in general.  He does not notice the goiter.   Past Medical History:  Diagnosis Date   Actinic keratoses    Arteriosclerosis    BPH (benign prostatic hyperplasia)    Diabetes mellitus type 2, controlled, without complications (HCC)    diet controlled   Diverticulosis    DJD (degenerative joint disease)    Dyslipidemia    Eczema    ED (erectile dysfunction)    Essential tremor    GERD (gastroesophageal reflux disease)    Headache    Hypertension    Low testosterone    Prostate cancer ([place]) 11/2019   Retinal detachment of left eye with single retinal tear yrs ago   Thrombocytopenia ([place])    Thyroid goiter    worked up for 3 -4 yrs ago no tx for    Past Surgical History:  Procedure Laterality Date   ANTERIOR CERVICAL DECOMP/DISCECTOMY FUSION N/A 11/28/2019   Procedure: Anterior Cervical Decompression Fusion - Cervical Three - Cervical Four;  Surgeon: Earnie Larsson, MD;  Location: [facility];  Service: Neurosurgery;  Laterality: N/A;  Cervical Three   BACK SURGERY     LUMBAR  2-3   03/2011   BACK SURGERY     Lami/microdiscectomy 02/13/18 - Dr. Earnie Larsson   BIOPSY THYROID     c-spine  2001   Dr. Annette Stable plates and screws   CATARACT EXTRACTION  [place] N/A 03/29/2020   Procedure: CYSTOSCOPY FLEXIBLE;  Surgeon: Raynelle Bring, MD;  Location: [facility];  Service: Urology;  Laterality: N/A;   fusion L2  2012   Dr. Annette Stable   HEMORRHOIDECTOMY WITH HEMORRHOID BANDING  30  yrs ago   KNEE SURGERY Bilateral 2005   Dr. Berenice Primas knee replacement   l-spine  2003   Dr. Annette Stable   laser surgery for holes in retina Right 08/2012   outpatient right shoulder  2008   Dr. Percell Miller   PROSTATE SURGERY     09/2016  LASER dr Gaynelle Arabian   PYLOROMYOTOMY     1944   RADIOACTIVE SEED IMPLANT N/A 03/29/2020   Procedure: RADIOACTIVE SEED IMPLANT/BRACHYTHERAPY IMPLANT;  Surgeon: Raynelle Bring, MD;  Location: [facility];  Service: Urology;  Laterality: N/A;   RETINAL DETACHMENT SURGERY Right 1996   Dr. Glennon Mac   retineal tear Left 11/2012   Dr. Darrick Grinder   SPACE OAR INSTILLATION N/A 03/29/2020   Procedure: SPACE OAR INSTILLATION;  Surgeon: Raynelle Bring, MD;  Location: [facility];  Service: Urology;  Laterality: N/A;   SPINAL FUSION     Pedicle screw and interbody fusion L2-3, L3-4, and L4-5   TOTAL HIP ARTHROPLASTY Left 06/05/2017   dr tim  murphy   TOTAL HIP ARTHROPLASTY Left 06/05/2017   Procedure: TOTAL HIP ARTHROPLASTY ANTERIOR APPROACH;  Surgeon: Renette Butters, MD;  Location: [facility];  Service: Orthopedics;  Laterality: Left;   VASECTOMY      Social  History   Socioeconomic History   Marital status: Married    Spouse name: Jeani Hawking   Number of children: 2   Years of education: 16+   Highest education level: Not on file  Occupational History   Occupation: retired Pharmacist, hospital  Tobacco Use   Smoking status: Never   Smokeless tobacco: Never  Vaping Use   Vaping Use: Never used  Substance and Sexual Activity   Alcohol use: Yes    Comment: rare   Drug use: No   Sexual activity: Not Currently  Other Topics Concern   Not on file  Social History Narrative   Patient lives with his wife.   Patient has 2 children.   Patient is retired.   Patient drinks 1 glass of caffeine daily.   Patient has a college education.   Social Determinants of Health   Financial Resource Strain: Not on file  Food Insecurity: Not on file  Transportation Needs:  Not on file  Physical Activity: Not on file  Stress: Not on file  Social Connections: Not on file  Intimate Partner Violence: Not on file    Current Outpatient Medications on File Prior to Visit  Medication Sig Dispense Refill   acetaminophen (TYLENOL) 500 MG tablet Take 1,000 mg by mouth every 6 (six) hours as needed for moderate pain or headache.      diazepam (VALIUM) 5 MG tablet Take 1 tablet (5 mg total) by mouth every 8 (eight) hours as needed for muscle spasms. Do not take with other muscle spasm medicine or pain medication. 30 tablet 0   docusate sodium (COLACE) 100 MG capsule Take 1 capsule (100 mg total) by mouth 2 (two) times daily. To prevent constipation while taking pain medication. (Patient taking differently: Take 100 mg by mouth daily as needed. To prevent constipation while taking pain medication.) 60 capsule 0   ezetimibe (ZETIA) 10 MG tablet Take 10 mg by mouth every evening.      HYDROmorphone (DILAUDID) 2 MG tablet Take 0.5-1 tablets (1-2 mg total) by mouth every 6 (six) hours as needed for moderate pain. 30 tablet 0   psyllium (METAMUCIL) 58.6 % powder Take 1 packet by mouth 3 (three) times daily. 2 teaspoons in am     rosuvastatin (CRESTOR) 5 MG tablet Take 5 mg by mouth daily. Takes once per week.     tamsulosin (FLOMAX) 0.4 MG CAPS capsule TAKE 1 CAPSULE AT BEDTIME. 30 capsule 2   zolpidem (AMBIEN) 10 MG tablet Take 10 mg by mouth at bedtime as needed for sleep.      No current facility-administered medications on file prior to visit.    Allergies  Allergen Reactions   Tramadol Other (See Comments)    Hallucinations -He thinks-just remembered had a problem   Hydrocodone Bitartrate [Hydrocodone] Itching and Other (See Comments)    Hives,  keeps awake   Oxycodone Hives    Keeps awake, likes hydromorphone   Statins Other (See Comments)    Muscle aches    Welchol [Colesevelam Hcl] Other (See Comments)    constipation    Family History  Problem Relation Age  of Onset   Emphysema Father    Arthritis Father    Cancer Mother        GYN   Diabetes Brother    High blood pressure Brother    Bladder Cancer Brother    Heart disease Paternal Grandfather    Prostate cancer Paternal Grandfather    Thyroid nodules Neg Hx  Colon cancer Neg Hx    Pancreatic cancer Neg Hx    Breast cancer Neg Hx     BP (!) 100/50    Pulse 84    Ht 5\' 8"  (1.727 m)    Wt 241 lb 3.2 oz (109.4 kg)    SpO2 96%    BMI 36.67 kg/m    Review of Systems     Objective:   Physical Exam VS: see vs page.  GEN: no distress.   NECK: a healed scar is present (C-spine procedure); there is no palpable thyroid enlargement.  No thyroid nodule is palpable.  No palpable lymphadenopathy at the anterior neck.        Assessment & Plan:  MNG: due for recheck.   Patient Instructions  Let's recheck the ultrasound.  you will receive a phone call, about a day and time for an appointment.   When you see Dr Jacelyn Grip soon, please ask that he check the thyroid blood test.   Please come back for a follow-up appointment in 1 year.

## 2021-09-15 NOTE — Patient Instructions (Addendum)
Let's recheck the ultrasound.  you will receive a phone call, about a day and time for an appointment.   When you see Dr Jacelyn Grip soon, please ask that he check the thyroid blood test.   Please come back for a follow-up appointment in 1 year.

## 2021-09-19 NOTE — Progress Notes (Signed)
Office Visit    Patient Name: MILLS MITTON Date of Encounter: 09/20/2021  Primary Care Provider:  Vernie Shanks, MD Primary Cardiologist:  Sanda Klein, MD  Chief Complaint    81 year old male with a history of CAD (coronary calcification on CT), hypertension, hyperlipidemia, type 2 diabetes, ED, prostate cancer and obesity who presents for follow-up related to CAD and hypertension.  Past Medical History    Past Medical History:  Diagnosis Date   Actinic keratoses    Arteriosclerosis    BPH (benign prostatic hyperplasia)    Diabetes mellitus type 2, controlled, without complications (HCC)    diet controlled   Diverticulosis    DJD (degenerative joint disease)    Dyslipidemia    Eczema    ED (erectile dysfunction)    Essential tremor    GERD (gastroesophageal reflux disease)    Headache    Hypertension    Low testosterone    Prostate cancer ([place]) 11/2019   Retinal detachment of left eye with single retinal tear yrs ago   Thrombocytopenia ([place])    Thyroid goiter    worked up for 3 -4 yrs ago no tx for   Past Surgical History:  Procedure Laterality Date   ANTERIOR CERVICAL DECOMP/DISCECTOMY FUSION N/A 11/28/2019   Procedure: Anterior Cervical Decompression Fusion - Cervical Three - Cervical Four;  Surgeon: Earnie Larsson, MD;  Location: [facility];  Service: Neurosurgery;  Laterality: N/A;  Cervical Three   BACK SURGERY     LUMBAR  2-3   03/2011   BACK SURGERY     Lami/microdiscectomy 02/13/18 - Dr. Earnie Larsson   BIOPSY THYROID     c-spine  2001   Dr. Annette Stable plates and screws   CATARACT EXTRACTION  [place] N/A 03/29/2020   Procedure: CYSTOSCOPY FLEXIBLE;  Surgeon: Raynelle Bring, MD;  [facility];  Service: Urology;  Laterality: N/A;   fusion L2  2012   Dr. Annette Stable   HEMORRHOIDECTOMY WITH HEMORRHOID BANDING  30 yrs ago   KNEE SURGERY Bilateral 2005   Dr. Berenice Primas knee replacement   l-spine  2003    Dr. Annette Stable   laser surgery for holes in retina Right 08/2012   outpatient right shoulder  2008   Dr. Percell Miller   PROSTATE SURGERY     09/2016  LASER dr Gaynelle Arabian   PYLOROMYOTOMY     1944   RADIOACTIVE SEED IMPLANT N/A 03/29/2020   Procedure: RADIOACTIVE SEED IMPLANT/BRACHYTHERAPY IMPLANT;  Surgeon: Raynelle Bring, MD;  Location: [facility];  Service: Urology;  Laterality: N/A;   RETINAL DETACHMENT SURGERY Right 1996   Dr. Glennon Mac   retineal tear Left 11/2012   Dr. Darrick Grinder   SPACE OAR INSTILLATION N/A 03/29/2020   Procedure: SPACE OAR INSTILLATION;  Surgeon: Raynelle Bring, MD;  Location: [facility];  Service: Urology;  Laterality: N/A;   SPINAL FUSION     Pedicle screw and interbody fusion L2-3, L3-4, and L4-5   TOTAL HIP ARTHROPLASTY Left 06/05/2017   dr tim  murphy   TOTAL HIP ARTHROPLASTY Left 06/05/2017   Procedure: TOTAL HIP ARTHROPLASTY ANTERIOR APPROACH;  Surgeon: Renette Butters, MD;  Location: [facility];  Service: Orthopedics;  Laterality: Left;   VASECTOMY      Allergies  Allergies  Allergen Reactions   Tramadol Other (See Comments)    Hallucinations -He thinks-just remembered had a problem  Hydrocodone Bitartrate [Hydrocodone] Itching and Other (See Comments)    Hives,  keeps awake   Oxycodone Hives    Keeps awake, likes hydromorphone   Statins Other (See Comments)    Muscle aches    Welchol [Colesevelam Hcl] Other (See Comments)    constipation    History of Present Illness    81 year old male with the above past medical history including CAD (coronary calcification on CT), hypertension, hyperlipidemia, type 2 diabetes, ED,  prostate cancer and obesity.  Coronary calcification and aortoiliac atherosclerosis seen on prior CT.  Nuclear stress test in 2017 was negative for ischemia.  Echocardiogram at the time showed normal LV function, EF 55%, GIDD. ABIs in 2017 were normal bilaterally.  Blood pressure medication was previously held  due to symptomatic hypotension.  He was last seen in the office on September 15, 2020 and was doing well overall from a cardiac standpoint.  He was undergoing treatment for prostate cancer and was struggling with ongoing ED.  He presents today for routine follow-up.  Since his last visit he has done well from a cardiac standpoint. He denies any symptoms concerning for angina.  He completed treatment for prostate cancer January of 2022 and states that on his most recent follow-up he received a good report.  He was recently restarted on his lisinopril by his primary care doctor and has follow-up with them for this later this week. He reports stable home blood pressures.  He reports feeling well overall, and denies any concerns or complaints today.  Home Medications    Current Outpatient Medications  Medication Sig Dispense Refill   acetaminophen (TYLENOL) 500 MG tablet Take 1,000 mg by mouth every 6 (six) hours as needed for moderate pain or headache.      ANDROGEL PUMP 20.25 MG/ACT (1.62%) GEL SMARTSIG:2 Pump Topical Every Morning     diazepam (VALIUM) 5 MG tablet Take 1 tablet (5 mg total) by mouth every 8 (eight) hours as needed for muscle spasms. Do not take with other muscle spasm medicine or pain medication. 30 tablet 0   docusate sodium (COLACE) 100 MG capsule Take 1 capsule (100 mg total) by mouth 2 (two) times daily. To prevent constipation while taking pain medication. (Patient taking differently: Take 100 mg by mouth daily as needed. To prevent constipation while taking pain medication.) 60 capsule 0   ezetimibe (ZETIA) 10 MG tablet Take 10 mg by mouth every evening.      gabapentin (NEURONTIN) 100 MG capsule 2 capsules     HYDROmorphone (DILAUDID) 2 MG tablet Take 0.5-1 tablets (1-2 mg total) by mouth every 6 (six) hours as needed for moderate pain. 30 tablet 0   lisinopril (ZESTRIL) 20 MG tablet Take 20 mg by mouth daily.     mirabegron ER (MYRBETRIQ) 25 MG TB24 tablet      psyllium  (METAMUCIL) 58.6 % powder Take 1 packet by mouth 3 (three) times daily. 2 teaspoons in am     rosuvastatin (CRESTOR) 5 MG tablet Take 5 mg by mouth daily. Takes once per week.     tamsulosin (FLOMAX) 0.4 MG CAPS capsule TAKE 1 CAPSULE AT BEDTIME. 30 capsule 2   zolpidem (AMBIEN) 10 MG tablet Take 10 mg by mouth at bedtime as needed for sleep.      No current facility-administered medications for this visit.     Review of Systems    He denies chest pain, palpitations, dyspnea, pnd, orthopnea, n, v, dizziness, syncope, edema, weight gain, or early  satiety. All other systems reviewed and are otherwise negative except as noted above.   Physical Exam    VS:  BP (!) 124/58    Pulse 75    Ht 5\' 8"  (1.727 m)    Wt 244 lb (110.7 kg)    SpO2 98%    BMI 37.10 kg/m  GEN: Well nourished, well developed, in no acute distress. HEENT: normal. Neck: Supple, no JVD, carotid bruits, or masses. Cardiac: RRR, no murmurs, rubs, or gallops. No clubbing, cyanosis, edema.  Radials/DP/PT 2+ and equal bilaterally.  Respiratory:  Respirations regular and unlabored, clear to auscultation bilaterally. GI: Soft, nontender, nondistended, BS + x 4. MS: no deformity or atrophy. Skin: warm and dry, no rash. Neuro:  Strength and sensation are intact. Psych: Normal affect.  Accessory Clinical Findings    ECG personally reviewed by me today - NSR, 75 bpm - no acute changes.  Lab Results  Component Value Date   WBC 5.2 03/25/2020   HGB 14.1 03/25/2020   HCT 43.0 03/25/2020   MCV 96.0 03/25/2020   PLT 154 03/25/2020   Lab Results  Component Value Date   CREATININE 1.16 03/25/2020   BUN 32 (H) 03/25/2020   NA 137 03/25/2020   K 4.5 03/25/2020   CL 102 03/25/2020   CO2 24 03/25/2020   Lab Results  Component Value Date   ALT 33 03/25/2020   AST 24 03/25/2020   ALKPHOS 44 03/25/2020   BILITOT 0.5 03/25/2020   No results found for: CHOL, HDL, LDLCALC, LDLDIRECT, TRIG, CHOLHDL  Lab Results  Component  Value Date   HGBA1C 7.7 (H) 11/24/2019    Assessment & Plan    1. CAD: Coronary calcification and aortoiliac atherosclerosis seen on prior CT.  Nuclear stress test in 2017 was negative for ischemia.  Echo in 2017 showed normal LV function, EF 55%, GIDD. Stable with no anginal symptoms. No indication for ischemic evaluation at this time.  Continue Crestor at reduced dose per PCP, Zetia, and lisinopril.   2. Grade 1 diastolic dysfunction: Prior echo as above. Euvolemic and well compensated on exam.  Continue current medications as above.  3. Hypertension: BP well controlled. Monitored and managed by PCP. Continue current antihypertensive regimen.    4. Hyperlipidemia: LDL was 89 in January 2022.  Monitored and managed by PCP.  He will have repeat lipids at his visit later this week.  Continue Zetia, Crestor as above.  5. Type 2 diabetes/obesity: A1c was 8.1 on 08/12/2021.  Monitor managed by PCP.  He is working on lifestyle modifications including diet and exercise to lower his A1c.  5. Disposition: Follow-up in 1 year.   Lenna Sciara, NP 09/20/2021, 1:57 PM

## 2021-09-20 ENCOUNTER — Encounter: Payer: Self-pay | Admitting: Nurse Practitioner

## 2021-09-20 ENCOUNTER — Ambulatory Visit: Payer: Medicare PPO | Admitting: Nurse Practitioner

## 2021-09-20 ENCOUNTER — Other Ambulatory Visit: Payer: Self-pay

## 2021-09-20 VITALS — BP 124/58 | HR 75 | Ht 68.0 in | Wt 244.0 lb

## 2021-09-20 DIAGNOSIS — I5189 Other ill-defined heart diseases: Secondary | ICD-10-CM | POA: Diagnosis not present

## 2021-09-20 DIAGNOSIS — I10 Essential (primary) hypertension: Secondary | ICD-10-CM | POA: Diagnosis not present

## 2021-09-20 DIAGNOSIS — I2510 Atherosclerotic heart disease of native coronary artery without angina pectoris: Secondary | ICD-10-CM

## 2021-09-20 DIAGNOSIS — Z6837 Body mass index (BMI) 37.0-37.9, adult: Secondary | ICD-10-CM

## 2021-09-20 DIAGNOSIS — E1169 Type 2 diabetes mellitus with other specified complication: Secondary | ICD-10-CM | POA: Diagnosis not present

## 2021-09-20 DIAGNOSIS — E669 Obesity, unspecified: Secondary | ICD-10-CM

## 2021-09-20 DIAGNOSIS — E785 Hyperlipidemia, unspecified: Secondary | ICD-10-CM

## 2021-09-20 NOTE — Patient Instructions (Signed)
Medication Instructions:  Your physician recommends that you continue on your current medications as directed. Please refer to the Current Medication list given to you today.  *If you need a refill on your cardiac medications before your next appointment, please call your pharmacy*  Lab Work: NONE ordered at this time of appointment   If you have labs (blood work) drawn today and your tests are completely normal, you will receive your results only by: [place] (if you have MyChart) OR A paper copy in the mail If you have any lab test that is abnormal or we need to change your treatment, we will call you to review the results.  Testing/Procedures: NONE ordered at this time of appointment   Follow-Up: At [facility], you and your health needs are our priority.  As part of our continuing mission to provide you with exceptional heart care, we have created designated Provider Care Teams.  These Care Teams include your primary Cardiologist (physician) and Advanced Practice Providers (APPs -  Physician Assistants and Nurse Practitioners) who all work together to provide you with the care you need, when you need it.  Your next appointment:   1 year(s)  The format for your next appointment:   In Person  Provider:   Sanda Klein, MD    Other Instructions

## 2021-09-22 DIAGNOSIS — R102 Pelvic and perineal pain: Secondary | ICD-10-CM | POA: Diagnosis not present

## 2021-09-22 DIAGNOSIS — E782 Mixed hyperlipidemia: Secondary | ICD-10-CM | POA: Diagnosis not present

## 2021-09-22 DIAGNOSIS — M48061 Spinal stenosis, lumbar region without neurogenic claudication: Secondary | ICD-10-CM | POA: Diagnosis not present

## 2021-09-22 DIAGNOSIS — Z794 Long term (current) use of insulin: Secondary | ICD-10-CM | POA: Diagnosis not present

## 2021-09-22 DIAGNOSIS — N1831 Chronic kidney disease, stage 3a: Secondary | ICD-10-CM | POA: Diagnosis not present

## 2021-09-22 DIAGNOSIS — Z923 Personal history of irradiation: Secondary | ICD-10-CM | POA: Diagnosis not present

## 2021-09-22 DIAGNOSIS — I7090 Unspecified atherosclerosis: Secondary | ICD-10-CM | POA: Diagnosis not present

## 2021-09-22 DIAGNOSIS — Z0000 Encounter for general adult medical examination without abnormal findings: Secondary | ICD-10-CM | POA: Diagnosis not present

## 2021-09-22 DIAGNOSIS — E1169 Type 2 diabetes mellitus with other specified complication: Secondary | ICD-10-CM | POA: Diagnosis not present

## 2021-09-22 DIAGNOSIS — I959 Hypotension, unspecified: Secondary | ICD-10-CM | POA: Diagnosis not present

## 2021-09-22 DIAGNOSIS — Z8546 Personal history of malignant neoplasm of prostate: Secondary | ICD-10-CM | POA: Diagnosis not present

## 2021-09-28 ENCOUNTER — Other Ambulatory Visit: Payer: Self-pay

## 2021-09-28 ENCOUNTER — Ambulatory Visit (HOSPITAL_BASED_OUTPATIENT_CLINIC_OR_DEPARTMENT_OTHER)
Admission: RE | Admit: 2021-09-28 | Discharge: 2021-09-28 | Disposition: A | Discharge status: Home / Self Care | Payer: Medicare PPO | Source: Ambulatory Visit | Attending: Endocrinology | Admitting: Endocrinology

## 2021-09-28 DIAGNOSIS — E042 Nontoxic multinodular goiter: Secondary | ICD-10-CM | POA: Insufficient documentation

## 2021-09-28 DIAGNOSIS — E041 Nontoxic single thyroid nodule: Secondary | ICD-10-CM | POA: Diagnosis not present

## 2021-10-05 DIAGNOSIS — E1169 Type 2 diabetes mellitus with other specified complication: Secondary | ICD-10-CM | POA: Diagnosis not present

## 2021-10-11 DIAGNOSIS — U071 COVID-19: Secondary | ICD-10-CM | POA: Diagnosis not present

## 2021-12-01 DIAGNOSIS — M48061 Spinal stenosis, lumbar region without neurogenic claudication: Secondary | ICD-10-CM | POA: Diagnosis not present

## 2021-12-01 DIAGNOSIS — I10 Essential (primary) hypertension: Secondary | ICD-10-CM | POA: Diagnosis not present

## 2021-12-01 DIAGNOSIS — M533 Sacrococcygeal disorders, not elsewhere classified: Secondary | ICD-10-CM | POA: Diagnosis not present

## 2021-12-01 DIAGNOSIS — Z6836 Body mass index (BMI) 36.0-36.9, adult: Secondary | ICD-10-CM | POA: Diagnosis not present

## 2021-12-13 DIAGNOSIS — E1169 Type 2 diabetes mellitus with other specified complication: Secondary | ICD-10-CM | POA: Diagnosis not present

## 2021-12-13 DIAGNOSIS — N1831 Chronic kidney disease, stage 3a: Secondary | ICD-10-CM | POA: Diagnosis not present

## 2021-12-13 DIAGNOSIS — E782 Mixed hyperlipidemia: Secondary | ICD-10-CM | POA: Diagnosis not present

## 2022-01-03 DIAGNOSIS — M47817 Spondylosis without myelopathy or radiculopathy, lumbosacral region: Secondary | ICD-10-CM | POA: Diagnosis not present

## 2022-01-25 DIAGNOSIS — L821 Other seborrheic keratosis: Secondary | ICD-10-CM | POA: Diagnosis not present

## 2022-01-25 DIAGNOSIS — D229 Melanocytic nevi, unspecified: Secondary | ICD-10-CM | POA: Diagnosis not present

## 2022-01-25 DIAGNOSIS — D225 Melanocytic nevi of trunk: Secondary | ICD-10-CM | POA: Diagnosis not present

## 2022-01-25 DIAGNOSIS — L570 Actinic keratosis: Secondary | ICD-10-CM | POA: Diagnosis not present

## 2022-01-25 DIAGNOSIS — L814 Other melanin hyperpigmentation: Secondary | ICD-10-CM | POA: Diagnosis not present

## 2022-01-26 DIAGNOSIS — N401 Enlarged prostate with lower urinary tract symptoms: Secondary | ICD-10-CM | POA: Diagnosis not present

## 2022-01-26 DIAGNOSIS — R350 Frequency of micturition: Secondary | ICD-10-CM | POA: Diagnosis not present

## 2022-01-26 DIAGNOSIS — R300 Dysuria: Secondary | ICD-10-CM | POA: Diagnosis not present

## 2022-02-06 DIAGNOSIS — M533 Sacrococcygeal disorders, not elsewhere classified: Secondary | ICD-10-CM | POA: Diagnosis not present

## 2022-02-07 DIAGNOSIS — Z794 Long term (current) use of insulin: Secondary | ICD-10-CM | POA: Diagnosis not present

## 2022-02-07 DIAGNOSIS — N1831 Chronic kidney disease, stage 3a: Secondary | ICD-10-CM | POA: Diagnosis not present

## 2022-02-07 DIAGNOSIS — N401 Enlarged prostate with lower urinary tract symptoms: Secondary | ICD-10-CM | POA: Diagnosis not present

## 2022-02-07 DIAGNOSIS — K219 Gastro-esophageal reflux disease without esophagitis: Secondary | ICD-10-CM | POA: Diagnosis not present

## 2022-02-07 DIAGNOSIS — M545 Low back pain, unspecified: Secondary | ICD-10-CM | POA: Diagnosis not present

## 2022-02-07 DIAGNOSIS — Z923 Personal history of irradiation: Secondary | ICD-10-CM | POA: Diagnosis not present

## 2022-02-07 DIAGNOSIS — E782 Mixed hyperlipidemia: Secondary | ICD-10-CM | POA: Diagnosis not present

## 2022-02-07 DIAGNOSIS — I7090 Unspecified atherosclerosis: Secondary | ICD-10-CM | POA: Diagnosis not present

## 2022-02-07 DIAGNOSIS — E1169 Type 2 diabetes mellitus with other specified complication: Secondary | ICD-10-CM | POA: Diagnosis not present

## 2022-07-24 ENCOUNTER — Ambulatory Visit: Payer: Medicare PPO | Admitting: Internal Medicine

## 2022-07-24 NOTE — Progress Notes (Deleted)
Name: Miguel Miller  MRN/ DOB: [id number], November 10, 1940    Age/ Sex: 81 y.o., male     PCP: Vernie Shanks, MD (Inactive)   Reason for Endocrinology Evaluation: Multinodular goiter     Initial Endocrinology Clinic Visit: 07/11/2016    PATIENT IDENTIFIER: [name] is a 81 y.o., male with a past medical history of MNG, HTN. He has followed with [facility] since 07/11/2016 for consultative assistance with management of his multinodular goiter.   HISTORICAL SUMMARY: The patient was first diagnosed with multinodular goiter in 2017 as an incidental finding on CT scan.  He is s/p benign FNA of the left thyroid nodule in 2017  Due to complaints of diaphoresis the patient had normal catecholamines, TFTs and calcitonin  SUBJECTIVE:    Today (07/24/2022):  [name] is here for a follow-up of multinodular goiter.     HISTORY:  Past Medical History:  Past Medical History:  Diagnosis Date   Actinic keratoses    Arteriosclerosis    BPH (benign prostatic hyperplasia)    Diabetes mellitus type 2, controlled, without complications (HCC)    diet controlled   Diverticulosis    DJD (degenerative joint disease)    Dyslipidemia    Eczema    ED (erectile dysfunction)    Essential tremor    GERD (gastroesophageal reflux disease)    Headache    Hypertension    Low testosterone    Prostate cancer ([place]) 11/2019   Retinal detachment of left eye with single retinal tear yrs ago   Thrombocytopenia ([place])    Thyroid goiter    worked up for 3 -4 yrs ago no tx for   Past Surgical History:  Past Surgical History:  Procedure Laterality Date   ANTERIOR CERVICAL DECOMP/DISCECTOMY FUSION N/A 11/28/2019   Procedure: Anterior Cervical Decompression Fusion - Cervical Three - Cervical Four;  Surgeon: Earnie Larsson, MD;  Location: [facility];  Service: Neurosurgery;  Laterality: N/A;  Cervical Three   BACK SURGERY     LUMBAR  2-3   03/2011   BACK SURGERY      Lami/microdiscectomy 02/13/18 - Dr. Earnie Larsson   BIOPSY THYROID     c-spine  2001   Dr. Annette Stable plates and screws   CATARACT EXTRACTION  [place] N/A 03/29/2020   Procedure: CYSTOSCOPY FLEXIBLE;  Surgeon: Raynelle Bring, MD;  Location: [facility];  Service: Urology;  Laterality: N/A;   fusion L2  2012   Dr. Annette Stable   HEMORRHOIDECTOMY WITH HEMORRHOID BANDING  30 yrs ago   KNEE SURGERY Bilateral 2005   Dr. Berenice Primas knee replacement   l-spine  2003   Dr. Annette Stable   laser surgery for holes in retina Right 08/2012   outpatient right shoulder  2008   Dr. Percell Miller   PROSTATE SURGERY     09/2016  LASER dr Miguel Miller   PYLOROMYOTOMY     1944   RADIOACTIVE SEED IMPLANT N/A 03/29/2020   Procedure: RADIOACTIVE SEED IMPLANT/BRACHYTHERAPY IMPLANT;  Surgeon: Raynelle Bring, MD;  Location: [facility];  Service: Urology;  Laterality: N/A;   RETINAL DETACHMENT SURGERY Right 1996   Dr. Glennon Mac   retineal tear Left 11/2012   Dr. Darrick Grinder   SPACE OAR INSTILLATION N/A 03/29/2020   Procedure: SPACE OAR INSTILLATION;  Surgeon: Raynelle Bring, MD;  Location: [facility];  Service: Urology;  Laterality: N/A;  SPINAL FUSION     Pedicle screw and interbody fusion L2-3, L3-4, and L4-5   TOTAL HIP ARTHROPLASTY Left 06/05/2017   dr tim  murphy   TOTAL HIP ARTHROPLASTY Left 06/05/2017   Procedure: TOTAL HIP ARTHROPLASTY ANTERIOR APPROACH;  Surgeon: Renette Butters, MD;  Location: [facility];  Service: Orthopedics;  Laterality: Left;   VASECTOMY     Social History:  reports that he has never smoked. He has never used smokeless tobacco. He reports current alcohol use. He reports that he does not use drugs. Family History:  Family History  Problem Relation Age of Onset   Emphysema Father    Arthritis Father    Cancer Mother        GYN   Diabetes Brother    High blood pressure Brother    Bladder Cancer Brother    Heart disease  Paternal Grandfather    Prostate cancer Paternal Grandfather    Thyroid nodules Neg Hx    Colon cancer Neg Hx    Pancreatic cancer Neg Hx    Breast cancer Neg Hx      HOME MEDICATIONS: Allergies as of 07/24/2022       Reactions   Tramadol Other (See Comments)   Hallucinations -He thinks-just remembered had a problem   Hydrocodone Bitartrate [hydrocodone] Itching, Other (See Comments)   Hives,  keeps awake   Oxycodone Hives   Keeps awake, likes hydromorphone   Statins Other (See Comments)   Muscle aches   Welchol [colesevelam Hcl] Other (See Comments)   constipation        Medication List        Accurate as of July 24, 2022  6:59 AM. If you have any questions, ask your nurse or doctor.          acetaminophen 500 MG tablet Commonly known as: TYLENOL Take 1,000 mg by mouth every 6 (six) hours as needed for moderate pain or headache.   AndroGel Pump 20.25 MG/ACT (1.62%) Gel Generic drug: Testosterone SMARTSIG:2 Pump Topical Every Morning   diazepam 5 MG tablet Commonly known as: Valium Take 1 tablet (5 mg total) by mouth every 8 (eight) hours as needed for muscle spasms. Do not take with other muscle spasm medicine or pain medication.   docusate sodium 100 MG capsule Commonly known as: Colace Take 1 capsule (100 mg total) by mouth 2 (two) times daily. To prevent constipation while taking pain medication. What changed:  when to take this reasons to take this   ezetimibe 10 MG tablet Commonly known as: ZETIA Take 10 mg by mouth every evening.   gabapentin 100 MG capsule Commonly known as: NEURONTIN 2 capsules   HYDROmorphone 2 MG tablet Commonly known as: DILAUDID Take 0.5-1 tablets (1-2 mg total) by mouth every 6 (six) hours as needed for moderate pain.   lisinopril 20 MG tablet Commonly known as: ZESTRIL Take 20 mg by mouth daily.   mirabegron ER 25 MG Tb24 tablet Commonly known as: MYRBETRIQ   psyllium 58.6 % powder Commonly known as:  METAMUCIL Take 1 packet by mouth 3 (three) times daily. 2 teaspoons in am   rosuvastatin 5 MG tablet Commonly known as: CRESTOR Take 5 mg by mouth daily. Takes once per week.   tamsulosin 0.4 MG Caps capsule Commonly known as: FLOMAX TAKE 1 CAPSULE AT BEDTIME.   zolpidem 10 MG tablet Commonly known as: AMBIEN Take 10 mg by mouth at bedtime as needed for sleep.  OBJECTIVE:   PHYSICAL EXAM: VS: There were no vitals taken for this visit.   EXAM: General: Pt appears well and is in NAD  Eyes: External eye exam normal without stare, lid lag or exophthalmos.  EOM intact.    Neck: General: Supple without adenopathy. Thyroid: Thyroid size normal.  No goiter or nodules appreciated. No thyroid bruit.  Lungs: Clear with good BS bilat with no rales, rhonchi, or wheezes  Heart: Auscultation: RRR.  Abdomen: Normoactive bowel sounds, soft, nontender, without masses or organomegaly palpable  Extremities:  BL LE: No pretibial edema normal ROM and strength.  Mental Status: Judgment, insight: Intact Orientation: Oriented to time, place, and person Mood and affect: No depression, anxiety, or agitation     DATA REVIEWED: ***  Thyroid ultrasound 09/28/2021  Estimated total number of nodules >/= 1 cm: 1   Number of spongiform nodules >/=  2 cm not described below (TR1): 0   Number of mixed cystic and solid nodules >/= 1.5 cm not described below ([place]): 0   _________________________________________________________   There are 3 measured nodules in the right thyroid, unchanged, none of which meet criteria for surveillance.   Nodules in the left thyroid labeled 4 and 5 are unchanged in do not meet criteria for surveillance.   Nodule labeled 6 inferior left thyroid measures 2.7 cm, previously 3.5 cm. This nodule was previously biopsied by report. Assuming benign result, no further specific follow-up would be indicated.   No adenopathy.   Recommendations follow those  established by the new ACR TI-RADS criteria (J Am Coll Radiol 0350;09:381-829).   IMPRESSION: Multinodular thyroid, essentially unchanged from the prior as above.     ASSESSMENT / PLAN / RECOMMENDATIONS:   Multinodular goiter:  Plan: ***    Medications   ***   Signed electronically by: Mack Guise, MD  [facility]  [facility] [address] [place], [place] [address] Phone: [phone] FAX: [phone]      CC: Vernie Shanks, MD (Inactive) No address on file Phone: None  Fax: None   Return to Endocrinology clinic as below: Future Appointments  Date Time Provider [place]  07/24/2022  7:50 AM [place] Berkey, Melanie Crazier, MD [facility] None

## 2022-08-10 DIAGNOSIS — M47817 Spondylosis without myelopathy or radiculopathy, lumbosacral region: Secondary | ICD-10-CM | POA: Diagnosis not present

## 2022-09-20 ENCOUNTER — Ambulatory Visit: Payer: Medicare PPO | Admitting: Endocrinology

## 2022-09-27 DIAGNOSIS — C61 Malignant neoplasm of prostate: Secondary | ICD-10-CM | POA: Diagnosis not present

## 2022-09-27 DIAGNOSIS — E291 Testicular hypofunction: Secondary | ICD-10-CM | POA: Diagnosis not present

## 2022-10-04 DIAGNOSIS — N35011 Post-traumatic bulbous urethral stricture: Secondary | ICD-10-CM | POA: Diagnosis not present

## 2022-10-04 DIAGNOSIS — C61 Malignant neoplasm of prostate: Secondary | ICD-10-CM | POA: Diagnosis not present

## 2022-10-04 DIAGNOSIS — E291 Testicular hypofunction: Secondary | ICD-10-CM | POA: Diagnosis not present

## 2022-10-04 DIAGNOSIS — N401 Enlarged prostate with lower urinary tract symptoms: Secondary | ICD-10-CM | POA: Diagnosis not present

## 2022-10-04 DIAGNOSIS — R3912 Poor urinary stream: Secondary | ICD-10-CM | POA: Diagnosis not present

## 2022-10-10 DIAGNOSIS — D696 Thrombocytopenia, unspecified: Secondary | ICD-10-CM | POA: Diagnosis not present

## 2022-10-10 DIAGNOSIS — Z0000 Encounter for general adult medical examination without abnormal findings: Secondary | ICD-10-CM | POA: Diagnosis not present

## 2022-10-10 DIAGNOSIS — I700 Atherosclerosis of aorta: Secondary | ICD-10-CM | POA: Diagnosis not present

## 2022-10-10 DIAGNOSIS — I2510 Atherosclerotic heart disease of native coronary artery without angina pectoris: Secondary | ICD-10-CM | POA: Diagnosis not present

## 2022-10-10 DIAGNOSIS — I10 Essential (primary) hypertension: Secondary | ICD-10-CM | POA: Diagnosis not present

## 2022-10-10 DIAGNOSIS — E785 Hyperlipidemia, unspecified: Secondary | ICD-10-CM | POA: Diagnosis not present

## 2022-10-10 DIAGNOSIS — E1165 Type 2 diabetes mellitus with hyperglycemia: Secondary | ICD-10-CM | POA: Diagnosis not present

## 2022-10-10 DIAGNOSIS — E119 Type 2 diabetes mellitus without complications: Secondary | ICD-10-CM | POA: Diagnosis not present

## 2022-10-16 ENCOUNTER — Telehealth: Payer: Self-pay

## 2022-10-16 NOTE — Patient Outreach (Signed)
  Care Coordination   10/16/2022 Name: Miguel Miller MRN: [id number] DOB: Jan 12, 1941   Care Coordination Outreach Attempts:  An unsuccessful telephone outreach was attempted today to offer the patient information about available care coordination services as a benefit of their health plan.   Follow Up Plan:  Additional outreach attempts will be made to offer the patient care coordination information and services.   Encounter Outcome:  No Answer   Care Coordination Interventions:  No, not indicated    SIG Peter Garter RN, BSN,CCM, CDE Care Management Coordinator [place] [facility] [phone]

## 2022-10-24 ENCOUNTER — Other Ambulatory Visit: Payer: Self-pay | Admitting: Urology

## 2022-10-26 DIAGNOSIS — M545 Low back pain, unspecified: Secondary | ICD-10-CM | POA: Diagnosis not present

## 2022-10-26 DIAGNOSIS — Z6836 Body mass index (BMI) 36.0-36.9, adult: Secondary | ICD-10-CM | POA: Diagnosis not present

## 2022-11-01 ENCOUNTER — Telehealth: Payer: Self-pay

## 2022-11-01 NOTE — Patient Outreach (Signed)
  Care Coordination   11/01/2022 Name: Miguel Miller MRN: [id number] DOB: Jul 30, 1941   Care Coordination Outreach Attempts:  A second unsuccessful outreach was attempted today to offer the patient with information about available care coordination services as a benefit of their health plan.     Follow Up Plan:  Additional outreach attempts will be made to offer the patient care coordination information and services.   Encounter Outcome:  No Answer   Care Coordination Interventions:  No, not indicated    Peter Garter RN, BSN,CCM, CDE Care Management Coordinator [place] [facility] [phone]

## 2022-11-09 ENCOUNTER — Telehealth: Payer: Self-pay

## 2022-11-09 DIAGNOSIS — G4700 Insomnia, unspecified: Secondary | ICD-10-CM | POA: Diagnosis not present

## 2022-11-09 DIAGNOSIS — F4321 Adjustment disorder with depressed mood: Secondary | ICD-10-CM | POA: Diagnosis not present

## 2022-11-09 NOTE — Patient Instructions (Signed)
Visit Information  Thank you for taking time to visit with me today. Please don't hesitate to contact me if I can be of assistance to you.   Following are the goals we discussed today:   Goals Addressed             This Visit's Progress    COMPLETED: Care Coordination Activities - no follow up required       Interventions Today    Flowsheet Row Most Recent Value  Chronic Disease   Chronic disease during today's visit Diabetes  General Interventions   General Interventions Discussed/Reviewed General Interventions Discussed, Labs, Doctor Visits  Labs Hgb A1c every 6 months  Doctor Visits Discussed/Reviewed Doctor Visits Discussed, Annual Wellness Visits, PCP  PCP/Specialist Visits Compliance with follow-up visit  Exercise Interventions   Exercise Discussed/Reviewed Exercise Discussed  Education Interventions   Education Provided Provided Education  Provided Verbal Education On Exercise, Labs, Blood Sugar Monitoring, Medication, Other  [care coordination services]  Labs Reviewed Hgb A1c              If you are experiencing a Mental Health or [place] or need someone to talk to, please call the Suicide and Crisis Lifeline: 988 call the [place] National Suicide Prevention Lifeline: 614-849-0833 or TTY: 205-026-4572 TTY 438-756-5635) to talk to a trained counselor call 1-800-273-TALK (toll free, 24 hour hotline) go to [facility] [place] [phone]) call 911   Patient verbalizes understanding of instructions and care plan provided today and agrees to view in [place]. Active MyChart status and patient understanding of how to access instructions and care plan via MyChart confirmed with patient.     No further follow up required:    Peter Garter RN, Jackquline [place], [place] [facility] [phone]

## 2022-11-09 NOTE — Patient Outreach (Signed)
  Care Coordination   Initial Visit Note   11/09/2022 Name: Miguel Miller MRN: [id number] DOB: Jan 04, 1941  IRENEO Miller is a 82 y.o. year old male who sees Lurline Del, DO for primary care. I spoke with  Raynald Kemp by phone today.  What matters to the patients health and wellness today?  No concerns today States his diabetes has been doing good and his A1C was 6.7% last month.  States he tries to eat healthy and exercise.  Declines any further RNCM needs    Goals Addressed             This Visit's Progress    COMPLETED: Care Coordination Activities - no follow up required       Interventions Today    Flowsheet Row Most Recent Value  Chronic Disease   Chronic disease during today's visit Diabetes  General Interventions   General Interventions Discussed/Reviewed General Interventions Discussed, Labs, Doctor Visits  Labs Hgb A1c every 6 months  Doctor Visits Discussed/Reviewed Doctor Visits Discussed, Annual Wellness Visits, PCP  PCP/Specialist Visits Compliance with follow-up visit  Exercise Interventions   Exercise Discussed/Reviewed Exercise Discussed  Education Interventions   Education Provided Provided Education  Provided Verbal Education On Exercise, Labs, Blood Sugar Monitoring, Medication, Other  [care coordination services]  Labs Reviewed Hgb A1c              SDOH assessments and interventions completed:  Yes  SDOH Interventions Today    Flowsheet Row Most Recent Value  SDOH Interventions   Food Insecurity Interventions Intervention Not Indicated  Housing Interventions Intervention Not Indicated  Transportation Interventions Intervention Not Indicated  Utilities Interventions Intervention Not Indicated  Financial Strain Interventions Intervention Not Indicated        Care Coordination Interventions:  Yes, provided   Follow up plan: No further intervention required.   Encounter Outcome:  Pt. Visit Completed  Peter Garter RN,  BSN,CCM, CDE Care Management Coordinator [place] [facility] [phone]

## 2022-11-16 DIAGNOSIS — N35011 Post-traumatic bulbous urethral stricture: Secondary | ICD-10-CM | POA: Diagnosis not present

## 2022-11-16 DIAGNOSIS — R300 Dysuria: Secondary | ICD-10-CM | POA: Diagnosis not present

## 2022-11-17 ENCOUNTER — Telehealth: Payer: Self-pay | Admitting: Internal Medicine

## 2022-11-17 ENCOUNTER — Ambulatory Visit (INDEPENDENT_AMBULATORY_CARE_PROVIDER_SITE_OTHER): Payer: Medicare PPO | Admitting: Internal Medicine

## 2022-11-17 ENCOUNTER — Encounter: Payer: Self-pay | Admitting: Internal Medicine

## 2022-11-17 VITALS — BP 120/70 | HR 79 | Ht 68.0 in | Wt 234.0 lb

## 2022-11-17 DIAGNOSIS — E042 Nontoxic multinodular goiter: Secondary | ICD-10-CM | POA: Diagnosis not present

## 2022-11-17 LAB — T4, FREE: Free T4: 0.91 ng/dL (ref 0.60–1.60)

## 2022-11-17 LAB — TSH: TSH: 1.26 u[IU]/mL (ref 0.35–5.50)

## 2022-11-17 NOTE — Telephone Encounter (Signed)
Notes have been faxed to Dr. Vanessa [place] at [phone].

## 2022-11-17 NOTE — Progress Notes (Signed)
Name: Miguel Miller  MRN/ DOB: [id number], 07-Aug-1941    Age/ Sex: 82 y.o., male     PCP: Lurline Del, DO   Reason for Endocrinology Evaluation: Multinodular goiter     Initial Endocrinology Clinic Visit: 07/11/2016    PATIENT IDENTIFIER: Miguel Miller is a 82 y.o., male with a past medical history of MNG, CHF, CAD and DM . He has followed with [facility] since 07/11/2016 for consultative assistance with management of his multinodular goiter.   HISTORICAL SUMMARY: The patient was first diagnosed with multinodular goiter in 2017, there was an incidental finding on CT.  Patient also reported diaphoresis, urine catecholamines and calcitonin were normal through his previous endocrinologist  He is s/p FNA of the dominant left nodule with benign cytology report.   By 2019, thyroid ultrasound showed that the previously biopsied left thyroid nodule is poorly defined  No Fh of thyroid disease    He was followed by Dr. Loanne Drilling from 2017 until 09/2021 SUBJECTIVE:    Today (11/17/2022):  [name] is here for a follow-up on thyroid nodules  Denies local neck swelling  He continues with dysphagia to to pills that is chronic  Has GERD  Denies palpitations  Denies loose stools or diarrhea  Has chronic essential tremors     HISTORY:  Past Medical History:  Past Medical History:  Diagnosis Date   Actinic keratoses    Arteriosclerosis    BPH (benign prostatic hyperplasia)    Diabetes mellitus type 2, controlled, without complications ([place])    diet controlled   Diverticulosis    DJD (degenerative joint disease)    Dyslipidemia    Eczema    ED (erectile dysfunction)    Essential tremor    GERD (gastroesophageal reflux disease)    Headache    Hypertension    Low testosterone    Prostate cancer ([place]) 11/2019   Retinal detachment of left eye with single retinal tear yrs ago   Thrombocytopenia ([place])    Thyroid goiter    worked up for 3 -4 yrs ago  no tx for   Past Surgical History:  Past Surgical History:  Procedure Laterality Date   ANTERIOR CERVICAL DECOMP/DISCECTOMY FUSION N/A 11/28/2019   Procedure: Anterior Cervical Decompression Fusion - Cervical Three - Cervical Four;  Surgeon: Earnie Larsson, MD;  Location: [place];  Service: Neurosurgery;  Laterality: N/A;  Cervical Three   BACK SURGERY     LUMBAR  2-3   03/2011   BACK SURGERY     Lami/microdiscectomy 02/13/18 - Dr. Earnie Larsson   BIOPSY THYROID     c-spine  2001   Dr. Annette Stable plates and screws   CATARACT EXTRACTION  [place] N/A 03/29/2020   Procedure: CYSTOSCOPY FLEXIBLE;  Surgeon: Raynelle Bring, MD;  Location: [facility];  Service: Urology;  Laterality: N/A;   fusion L2  2012   Dr. Annette Stable   HEMORRHOIDECTOMY WITH HEMORRHOID BANDING  30 yrs ago   KNEE SURGERY Bilateral 2005   Dr. Berenice Primas knee replacement   l-spine  2003   Dr. Annette Stable   laser surgery for holes in retina Right 08/2012   outpatient right shoulder  2008   Dr. Percell [facility]   PROSTATE SURGERY     09/2016  LASER dr Gaynelle Arabian   PYLOROMYOTOMY     1944   RADIOACTIVE SEED IMPLANT N/A 03/29/2020   Procedure: RADIOACTIVE SEED  IMPLANT/BRACHYTHERAPY IMPLANT;  Surgeon: Raynelle Bring, MD;  Location: [facility];  Service: Urology;  Laterality: N/A;   RETINAL DETACHMENT SURGERY Right 1996   Dr. Glennon Mac   retineal tear Left 11/2012   Dr. Darrick Grinder   SPACE OAR INSTILLATION N/A 03/29/2020   Procedure: SPACE OAR INSTILLATION;  Surgeon: Raynelle Bring, MD;  [facility];  Service: Urology;  Laterality: N/A;   SPINAL FUSION     Pedicle screw and interbody fusion L2-3, L3-4, and L4-5   TOTAL HIP ARTHROPLASTY Left 06/05/2017   dr tim  murphy   TOTAL HIP ARTHROPLASTY Left 06/05/2017   Procedure: TOTAL HIP ARTHROPLASTY ANTERIOR APPROACH;  Surgeon: Renette Butters, MD;  Location: [place];  Service: Orthopedics;  Laterality: Left;    VASECTOMY     Social History:  reports that he has never smoked. He has never used smokeless tobacco. He reports current alcohol use. He reports that he does not use drugs. Family History:  Family History  Problem Relation Age of Onset   Emphysema Father    Arthritis Father    Cancer Mother        GYN   Diabetes Brother    High blood pressure Brother    Bladder Cancer Brother    Heart disease Paternal Grandfather    Prostate cancer Paternal Grandfather    Thyroid nodules Neg Hx    Colon cancer Neg Hx    Pancreatic cancer Neg Hx    Breast cancer Neg Hx      HOME MEDICATIONS: Allergies as of 11/17/2022       Reactions   Tramadol Other (See Comments)   Hallucinations -He thinks-just remembered had a problem   Hydrocodone Bitartrate [hydrocodone] Itching, Other (See Comments)   Hives,  keeps awake   Oxycodone Hives, Itching   Keeps awake, likes hydromorphone   Statins Other (See Comments)   Muscle aches Tolerates rosuvastatin in low doses    Welchol [colesevelam Hcl] Other (See Comments)   constipation        Medication List        Accurate as of November 17, 2022  9:39 AM. If you have any questions, ask your nurse or doctor.          acetaminophen 500 MG tablet Commonly known as: TYLENOL Take 1,000 mg by mouth every 6 (six) hours as needed for moderate pain or headache.   AndroGel Pump 20.25 MG/ACT (1.62%) Gel Generic drug: Testosterone Apply 2 Pump topically daily.   aspirin-sod bicarb-citric acid 325 MG Tbef tablet Commonly known as: ALKA-SELTZER Take 650 mg by mouth every 6 (six) hours as needed (indigestion).   ezetimibe 10 MG tablet Commonly known as: ZETIA Take 10 mg by mouth every evening.   famotidine-calcium carbonate-magnesium hydroxide 10-800-165 MG chewable tablet Commonly known as: PEPCID COMPLETE Chew 1 tablet by mouth daily as needed (acid reflux).   gabapentin 100 MG capsule Commonly known as: NEURONTIN Take 200 mg by mouth every  evening.   lisinopril 20 MG tablet Commonly known as: ZESTRIL Take 20 mg by mouth daily.   metFORMIN 500 MG 24 hr tablet Commonly known as: GLUCOPHAGE-XR Take 500-1,000 mg by mouth See admin instructions. Take 500 mg in the morning and 1000 mg in the evening   mirabegron ER 25 MG Tb24 tablet Commonly known as: MYRBETRIQ Take 25 mg by mouth daily.   rosuvastatin 5 MG tablet Commonly known as: CRESTOR Take 5 mg by mouth every Monday, Wednesday, and Friday.  tamsulosin 0.4 MG Caps capsule Commonly known as: FLOMAX TAKE 1 CAPSULE AT BEDTIME.   traZODone 50 MG tablet Commonly known as: DESYREL Take 50 mg by mouth at bedtime as needed for sleep.          OBJECTIVE:   PHYSICAL EXAM: VS: BP 120/70 (BP Location: Left Arm, Patient Position: Sitting, Cuff Size: Large)   Pulse 79   Ht 5\' 8"  (1.727 m)   Wt 234 lb (106.1 kg)   SpO2 99%   BMI 35.58 kg/m    EXAM: General: Pt appears well and is in NAD  Neck: General: Supple without adenopathy. Thyroid:   No goiter or nodules appreciated.   Lungs: Clear with good BS bilat   Heart: Auscultation: RRR.  Extremities:  BL LE: No pretibial edema normal ROM and strength.  Mental Status: Judgment, insight: Intact Orientation: Oriented to time, place, and person Mood and affect: No depression, anxiety, or agitation     DATA REVIEWED:   Latest Reference Range & Units 11/17/22 09:56  TSH 0.35 - 5.50 uIU/mL 1.26  T4,Free(Direct) 0.60 - 1.60 ng/dL 0.91    Thyroid ultrasound 09/28/2021 Estimated total number of nodules >/= 1 cm: 1   Number of spongiform nodules >/=  2 cm not described below (TR1): 0   Number of mixed cystic and solid nodules >/= 1.5 cm not described below ([place]): 0   _________________________________________________________   There are 3 measured nodules in the right thyroid, unchanged, none of which meet criteria for surveillance.   Nodules in the left thyroid labeled 4 and 5 are unchanged in do  not meet criteria for surveillance.   Nodule labeled 6 inferior left thyroid measures 2.7 cm, previously 3.5 cm. This nodule was previously biopsied by report. Assuming benign result, no further specific follow-up would be indicated.   No adenopathy.   Recommendations follow those established by the new ACR TI-RADS criteria (J Am Coll Radiol N8838707).   IMPRESSION: Multinodular thyroid, essentially unchanged from the prior as above.    FNA 07/14/2016  THYROID, LEFT LOWER POLE, FINE NEEDLE ASPIRATION (SPECIMEN 1 OF 1 COLLECTED 07/14/2016): CONSISTENT WITH BENIGN FOLLICULAR NODULE (BETHES[place] CATEGORY II).  ASSESSMENT / PLAN / RECOMMENDATIONS:   Multinodular goiter   - No local neck symptoms - Pt is clinically euthyroid  - S/P benign FNA of left thyorid nodule in 2017, this has decreased in size 3.5 to 2.7 cm in 2023 which confirmed a 5 year stability and NO serial ultrasound was recommended  -TFTs remain normal   Patient will be turned over to his PCP for annual TFTs as no further ultrasounds are recommended unless there is a new clinical concern    Signed electronically by: Mack Guise, MD  [facility] End[facility]  [facility] [address] [place], [place] [address] Phone: [phone] FAX: [phone]      CC: Lurline Del, [place] [address] Phone: [phone]  Fax: [phone]   Return to Endocrinology clinic as below: Future Appointments  Date Time Provider [place]  11/17/2022  9:50 AM Lev Cervone, Melanie Crazier, MD [facility] None  11/21/2022  9:00 AM [facility] PAT 1 [facility] None

## 2022-11-17 NOTE — Telephone Encounter (Signed)
Can you please fax his last note with me to his PCP?   I tried to do it through epic but I was unable to find the name, and this is a new PCP through [employer]

## 2022-11-19 NOTE — Progress Notes (Addendum)
COVID Vaccine received:  []  No [x]  Yes Date of any COVID positive Test in last 90 days: none  PCP - Jackelyn Poling, DO  at [facility]  [phone] Cardiologist - Thurmon Fair, MD (Theron Arista 09-15-2020) Endocrinology- Terrace Arabia, MD  Chest x-ray - 02-27-2020 Epic EKG -  09-20-2021  Epic   will repeat at PST Stress Test - 2017 Epic ECHO- 2017  Epic Cardiac Cath -   PCR screen: []  Ordered & Completed                      []   No Order but Needs PROFEND                      [x]   N/A for this surgery  Surgery Plan:  [x]  Ambulatory                            []  Outpatient in bed                            []  Admit  Anesthesia:    [x]  General  []  Spinal                           []   Choice []   MAC  Bowel Prep - [x]  No  []   Yes ______  Pacemaker / ICD device [x]  No []  Yes        Device order form faxed [x]  No    []   Yes      Faxed to:  Spinal Cord Stimulator:[]  No [x]  Yes  had it Removed 2020    History of Sleep Apnea? [x]  No []  Yes   CPAP used?- [x]  No []  Yes    Does the patient monitor blood sugar? []  No [x]  Yes  []  N/A  Patient has: []  Pre-DM   []  DM1  [x]   DM2 Does patient have a [employer] or Dexacom? [x]  No []  Yes   Fasting Blood Sugar Ranges-  Checks Blood Sugar  1 times a month Other Diabetic medications/ instructions: Metformin- takes 500 mg q am and 1000 q pm  Blood Thinner / Instructions: none Aspirin Instructions: None  ERAS Protocol Ordered: [x]  No  []  Yes Patient is to be NPO after: midnight prior  Activity level: Patient is able to climb a flight of stairs without difficulty; [x]  No CP   but would have SOB, back/leg pain. Patient can perform ADLs without assistance.   Anesthesia review: DM2, HTN, CAD- CT calcifications (worked up in 2017 nonobstructive at that time)Tremors in hands, thrombocytopenia, Hx ACDF x 2 levels in 2021, Had Spinal Cord stimulator was removed in 2020, CRPS (chronic opiate use)  Patient denies shortness of breath,  fever, cough and chest pain at PAT appointment.  Patient verbalized understanding and agreement to the Pre-Surgical Instructions that were given to them at this PAT appointment. Patient was also educated of the need to review these PAT instructions again prior to his surgery.I reviewed the appropriate phone numbers to call if they have any and questions or concerns.

## 2022-11-19 NOTE — Patient Instructions (Addendum)
SURGICAL WAITING ROOM VISITATION Patients having surgery or a procedure may have no more than 2 support people in the waiting area - these visitors may rotate in the visitor waiting room.   Due to an increase in RSV and influenza rates and associated hospitalizations, children ages 29 and under may not visit patients in [place]. If the patient needs to stay at the hospital during part of their recovery, the visitor guidelines for inpatient rooms apply.  PRE-OP VISITATION  Pre-op nurse will coordinate an appropriate time for 1 support person to accompany the patient in pre-op.  This support person may not rotate.  This visitor will be contacted when the time is appropriate for the visitor to come back in the pre-op area.  Please refer to the [facility] website for the visitor guidelines for Inpatients (after your surgery is over and you are in a regular room).  You are not required to quarantine at this time prior to your surgery. However, you must do this: Hand Hygiene often Do NOT share personal items Notify your provider if you are in close contact with someone who has COVID or you develop fever 100.4 or greater, new onset of sneezing, cough, sore throat, shortness of breath or body aches.  If you test positive for Covid or have been in contact with anyone that has tested positive in the last 10 days please notify you surgeon.    Your procedure is scheduled on: Thursday  November 30, 2022   Report to [facility] Main Entrance: [place] entrance where the [employer] is available.   Report to admitting at: 08:00  AM  +++++Call this number if you have any questions or problems the morning of surgery [phone]  DO NOT EAT OR DRINK ANYTHING AFTER MIDNIGHT THE NIGHT PRIOR TO YOUR SURGERY / PROCEDURE.   FOLLOW BOWEL PREP AND ANY ADDITIONAL PRE OP INSTRUCTIONS YOU RECEIVED FROM YOUR SURGEON'S OFFICE!!!   Oral Hygiene is also important to reduce your risk of  infection.        Remember - BRUSH YOUR TEETH THE MORNING OF SURGERY WITH YOUR REGULAR TOOTHPASTE  Do NOT smoke after Midnight the night before surgery.  Take ONLY these medicines the morning of surgery with A SIP OF WATER:  None  You may not have any metal on your body including  jewelry, and body piercing  Do not wear lotions, powders, cologne, or deodorant  Men may shave face and neck.  Patients discharged on the day of surgery will not be allowed to drive home.  Someone NEEDS to stay with you for the first 24 hours after anesthesia.  Do not bring your home medications to the hospital. The Pharmacy will dispense medications listed on your medication list to you during your admission in the Hospital.  Special Instructions: Bring a copy of your healthcare power of attorney and living will documents the day of surgery, if you wish to have them scanned into your [facility] Records- EPIC  Please read over the following fact sheets you were given: IF YOU HAVE QUESTIONS ABOUT YOUR [place], [place] [phone].   [place] - Preparing for Surgery Before surgery, you can play an important role.  Because skin is not sterile, your skin needs to be as free of germs as possible.  You can reduce the number of germs on your skin by washing with CHG (chlorahexidine gluconate) soap before surgery.  CHG is an antiseptic cleaner which kills germs and bonds  with the skin to continue killing germs even after washing. Please DO NOT use if you have an allergy to CHG or antibacterial soaps.  If your skin becomes reddened/irritated stop using the CHG and inform your nurse when you arrive at Short Stay. Do not shave (including legs and underarms) for at least 48 hours prior to the first CHG shower.  You may shave your face/neck.  Please follow these instructions carefully:  1.  Shower with CHG Soap the night before surgery and the  morning of surgery.  2.  If you choose to wash your  hair, wash your hair first as usual with your normal  shampoo.  3.  After you shampoo, rinse your hair and body thoroughly to remove the shampoo.                             4.  Use CHG as you would any other liquid soap.  You can apply chg directly to the skin and wash.  Gently with a scrungie or clean washcloth.  5.  Apply the CHG Soap to your body ONLY FROM THE NECK DOWN.   Do not use on face/ open                           Wound or open sores. Avoid contact with eyes, ears mouth and genitals (private parts).                       Wash face,  Genitals (private parts) with your normal soap.             6.  Wash thoroughly, paying special attention to the area where your  surgery  will be performed.  7.  Thoroughly rinse your body with warm water from the neck down.  8.  DO NOT shower/wash with your normal soap after using and rinsing off the CHG Soap.            9.  Pat yourself dry with a clean towel.            10.  Wear clean pajamas.            11.  Place clean sheets on your bed the night of your first shower and do not  sleep with pets.  ON THE DAY OF SURGERY : Do not apply any lotions/deodorants the morning of surgery.  Please wear clean clothes to the hospital/surgery center.    FAILURE TO FOLLOW THESE INSTRUCTIONS MAY RESULT IN THE CANCELLATION OF YOUR SURGERY  PATIENT SIGNATURE_________________________________  NURSE SIGNATURE__________________________________  ________________________________________________________________________

## 2022-11-21 ENCOUNTER — Encounter (HOSPITAL_COMMUNITY): Payer: Self-pay

## 2022-11-21 ENCOUNTER — Other Ambulatory Visit: Payer: Self-pay

## 2022-11-21 ENCOUNTER — Encounter (HOSPITAL_COMMUNITY)
Admission: RE | Admit: 2022-11-21 | Discharge: 2022-11-21 | Disposition: A | Discharge status: Home / Self Care | Payer: Medicare PPO | Source: Ambulatory Visit | Attending: Urology | Admitting: Urology

## 2022-11-21 VITALS — BP 119/73 | HR 66 | Temp 98.0°F | Resp 16 | Ht 68.0 in | Wt 227.0 lb

## 2022-11-21 DIAGNOSIS — I10 Essential (primary) hypertension: Secondary | ICD-10-CM | POA: Diagnosis not present

## 2022-11-21 DIAGNOSIS — Z01818 Encounter for other preprocedural examination: Secondary | ICD-10-CM | POA: Insufficient documentation

## 2022-11-21 DIAGNOSIS — N529 Male erectile dysfunction, unspecified: Secondary | ICD-10-CM

## 2022-11-21 DIAGNOSIS — N35919 Unspecified urethral stricture, male, unspecified site: Secondary | ICD-10-CM | POA: Insufficient documentation

## 2022-11-21 DIAGNOSIS — E042 Nontoxic multinodular goiter: Secondary | ICD-10-CM

## 2022-11-21 DIAGNOSIS — E119 Type 2 diabetes mellitus without complications: Secondary | ICD-10-CM | POA: Diagnosis not present

## 2022-11-21 DIAGNOSIS — Z8546 Personal history of malignant neoplasm of prostate: Secondary | ICD-10-CM | POA: Diagnosis not present

## 2022-11-21 DIAGNOSIS — M1612 Unilateral primary osteoarthritis, left hip: Secondary | ICD-10-CM

## 2022-11-21 DIAGNOSIS — E1169 Type 2 diabetes mellitus with other specified complication: Secondary | ICD-10-CM

## 2022-11-21 HISTORY — DX: Anxiety disorder, unspecified: F41.9

## 2022-11-21 LAB — COMPREHENSIVE METABOLIC PANEL
ALT: 34 U/L (ref 0–44)
AST: 25 U/L (ref 15–41)
Albumin: 4 g/dL (ref 3.5–5.0)
Alkaline Phosphatase: 33 U/L — ABNORMAL LOW (ref 38–126)
Anion gap: 10 (ref 5–15)
BUN: 27 mg/dL — ABNORMAL HIGH (ref 8–23)
CO2: 26 mmol/L (ref 22–32)
Calcium: 9.1 mg/dL (ref 8.9–10.3)
Chloride: 101 mmol/L (ref 98–111)
Creatinine, Ser: 1.19 mg/dL (ref 0.61–1.24)
GFR, Estimated: >60 mL/min (ref >60)
Glucose, Bld: 141 mg/dL — ABNORMAL HIGH (ref 70–99)
Potassium: 4.7 mmol/L (ref 3.5–5.1)
Sodium: 137 mmol/L (ref 135–145)
Total Bilirubin: 0.6 mg/dL (ref 0.3–1.2)
Total Protein: 7 g/dL (ref 6.5–8.1)

## 2022-11-21 LAB — CBC
HCT: 45.8 % (ref 39.0–52.0)
Hemoglobin: 14.5 g/dL (ref 13.0–17.0)
MCH: 29.8 pg (ref 26.0–34.0)
MCHC: 31.7 g/dL (ref 30.0–36.0)
MCV: 94.2 fL (ref 80.0–100.0)
Platelets: 146 10*3/uL — ABNORMAL LOW (ref 150–400)
RBC: 4.86 MIL/uL (ref 4.22–5.81)
RDW: 13.1 % (ref 11.5–15.5)
WBC: 4.7 10*3/uL (ref 4.0–10.5)
nRBC: 0 % (ref 0.0–0.2)

## 2022-11-21 LAB — HEMOGLOBIN A1C
Hgb A1c MFr Bld: 6.7 % — ABNORMAL HIGH (ref 4.8–5.6)
Mean Plasma Glucose: 146 mg/dL

## 2022-11-21 LAB — GLUCOSE, CAPILLARY: Glucose-Capillary: 139 mg/dL — ABNORMAL HIGH (ref 70–99)

## 2022-11-22 DIAGNOSIS — F4321 Adjustment disorder with depressed mood: Secondary | ICD-10-CM | POA: Diagnosis not present

## 2022-11-22 DIAGNOSIS — Z6835 Body mass index (BMI) 35.0-35.9, adult: Secondary | ICD-10-CM | POA: Diagnosis not present

## 2022-11-22 NOTE — Progress Notes (Signed)
Anesthesia Chart Review   Case: [id number] Date/Time: 11/30/22 1000   Procedure: CYSTOSCOPY WITH POSSIBLE BALLOON DILATATION OF URETHRAL STRICTURE - 45 MINUTES NEEDED FOR CASE   Anesthesia type: General   Pre-op diagnosis: URETHRAL STRICTURE   Location: [place] [facility]   Surgeons: Raynelle Bring, MD       DISCUSSION:82 y.o. never smoker with h/o HTN, DM II, prostate cancer, urethral stricture scheduled for above procedure 11/30/2022 with Dr. Raynelle Bring.   H/o spinal cord stimulator, has been removed.   H/o 2 level ACDF.   Nuclear stress test in 2017 was negative for ischemia.  Echocardiogram at the time showed normal LV function, EF 55%, GIDD. ABIs in 2017 were normal bilaterally.  VS: BP 119/73 Comment: right am sitting  Pulse 66   Temp 36.7 C (Oral)   Resp 16   Ht 5\' 8"  (1.727 m)   Wt 103 kg   SpO2 99%   BMI 34.52 kg/m   PROVIDERS: Lurline Del, DO is PCP    LABS: Labs reviewed: Acceptable for surgery. (all labs ordered are listed, but only abnormal results are displayed)  Labs Reviewed  HEMOGLOBIN A1C - Abnormal; Notable for the following components:      Result Value   Hgb A1c MFr Bld 6.7 (*)    All other components within normal limits  COMPREHENSIVE METABOLIC PANEL - Abnormal; Notable for the following components:   Glucose, Bld 141 (*)    BUN 27 (*)    Alkaline Phosphatase 33 (*)    All other components within normal limits  CBC - Abnormal; Notable for the following components:   Platelets 146 (*)    All other components within normal limits  GLUCOSE, CAPILLARY - Abnormal; Notable for the following components:   Glucose-Capillary 139 (*)    All other components within normal limits     IMAGES:   EKG:   CV: Echo 08/10/2016 - Left ventricle: The cavity size was normal. Wall thickness was    normal. The estimated ejection fraction was 55%. Although no    diagnostic regional wall motion abnormality was identified, this    possibility  cannot be completely excluded on the basis of this    study. Doppler parameters are consistent with abnormal left    ventricular relaxation (grade 1 diastolic dysfunction).  - Aortic valve: There was no stenosis. There was trivial    regurgitation.  - Mitral valve: There was no significant regurgitation.  - Right ventricle: The cavity size was normal. Systolic function    was normal.  - Pulmonary arteries: No complete TR doppler jet so unable to    estimate PA systolic pressure.  - Inferior vena cava: The vessel was normal in size. The    respirophasic diameter changes were in the normal range (>= 50%),    consistent with normal central venous pressure.  Past Medical History:  Diagnosis Date   Actinic keratoses    Anxiety    Arteriosclerosis    BPH (benign prostatic hyperplasia)    Diabetes mellitus type 2, controlled, without complications ([facility])    diet controlled   Diverticulosis    DJD (degenerative joint disease)    Dyslipidemia    Eczema    ED (erectile dysfunction)    Essential tremor    both hands   GERD (gastroesophageal reflux disease)    Headache    Hypertension    Low testosterone    Prostate cancer ([place]) 11/2019   Retinal detachment of left  eye with single retinal tear yrs ago   Thrombocytopenia ([place])    Thyroid goiter    worked up for 3 -4 yrs ago no tx for    Past Surgical History:  Procedure Laterality Date   ANTERIOR CERVICAL DECOMP/DISCECTOMY FUSION N/A 11/28/2019   Procedure: Anterior Cervical Decompression Fusion - Cervical Three - Cervical Four;  Surgeon: Earnie Larsson, MD;  Location: [place];  Service: Neurosurgery;  Laterality: N/A;  Cervical Three   BACK SURGERY     LUMBAR  2-3   03/2011   BACK SURGERY     Lami/microdiscectomy 02/13/18 - Dr. Earnie Larsson   BIOPSY THYROID     c-spine  2001   Dr. Annette Stable plates and screws   CATARACT EXTRACTION  [place] N/A 03/29/2020   Procedure: CYSTOSCOPY FLEXIBLE;   Surgeon: Raynelle Bring, MD;  Location: [facility];  Service: Urology;  Laterality: N/A;   fusion L2  2012   Dr. Annette Stable   HEMORRHOIDECTOMY WITH HEMORRHOID BANDING  30 yrs ago   KNEE SURGERY Bilateral 2005   Dr. Berenice Primas knee replacement   l-spine  2003   Dr. Annette Stable   laser surgery for holes in retina Right 08/2012   outpatient right shoulder  2008   Dr. Percell Miller   PROSTATE SURGERY     09/2016  LASER dr Gaynelle Arabian   PYLOROMYOTOMY     1944   RADIOACTIVE SEED IMPLANT N/A 03/29/2020   Procedure: RADIOACTIVE SEED IMPLANT/BRACHYTHERAPY IMPLANT;  Surgeon: Raynelle Bring, MD;  [facility];  Service: Urology;  Laterality: N/A;   RETINAL DETACHMENT SURGERY Right 1996   Dr. Glennon Mac   retineal tear Left 11/2012   Dr. Darrick Grinder   SPACE OAR INSTILLATION N/A 03/29/2020   Procedure: SPACE OAR INSTILLATION;  Surgeon: Raynelle Bring, MD;  Location[facility];  Service: Urology;  Laterality: N/A;   SPINAL FUSION     Pedicle screw and interbody fusion L2-3, L3-4, and L4-5   TOTAL HIP ARTHROPLASTY Left 06/05/2017   Procedure: TOTAL HIP ARTHROPLASTY ANTERIOR APPROACH;  Surgeon: Renette Butters, MD;  Location: [place];  Service: Orthopedics;  Laterality: Left;   VASECTOMY      MEDICATIONS:  acetaminophen (TYLENOL) 500 MG tablet   ANDROGEL PUMP 20.25 MG/ACT (1.62%) GEL   aspirin-sod bicarb-citric acid (ALKA-SELTZER) 325 MG TBEF tablet   ezetimibe (ZETIA) 10 MG tablet   famotidine-calcium carbonate-magnesium hydroxide (PEPCID COMPLETE) 10-800-165 MG chewable tablet   gabapentin (NEURONTIN) 100 MG capsule   lisinopril (ZESTRIL) 20 MG tablet   metFORMIN (GLUCOPHAGE-XR) 500 MG 24 hr tablet   mirabegron ER (MYRBETRIQ) 25 MG TB24 tablet   rosuvastatin (CRESTOR) 5 MG tablet   tamsulosin (FLOMAX) 0.4 MG CAPS capsule   traZODone (DESYREL) 50 MG tablet   No current facility-administered medications for this encounter.      Konrad Felix Ward,  PA-C [facility] Pre-Surgical Testing [phone]

## 2022-11-23 DIAGNOSIS — M47816 Spondylosis without myelopathy or radiculopathy, lumbar region: Secondary | ICD-10-CM | POA: Diagnosis not present

## 2022-11-23 DIAGNOSIS — G894 Chronic pain syndrome: Secondary | ICD-10-CM | POA: Diagnosis not present

## 2022-11-23 DIAGNOSIS — Z981 Arthrodesis status: Secondary | ICD-10-CM | POA: Diagnosis not present

## 2022-11-23 DIAGNOSIS — Z1330 Encounter for screening examination for mental health and behavioral disorders, unspecified: Secondary | ICD-10-CM | POA: Diagnosis not present

## 2022-11-29 NOTE — H&P (Signed)
Office Visit Report     11/16/2022   --------------------------------------------------------------------------------   Miguel Miller  [id number]  DOB: 1941/04/24, 82 year old Male  SSN: [id number]   PRIMARY CARE:  Miguel Miller. Miguel Miller (retired), MD  PRIMARY CARE FAX:  [phone]  REFERRING:  Miguel Miller  PROVIDER:  Raynelle Miller, M.D.  TREATING:  Miguel Norris, PA-C  LOCATION:  [facility] [phone]     --------------------------------------------------------------------------------   CC/HPI: 1. Prostate cancer  2. Testosterone deficiency  3. BPH/LUTS  4. Dysuria/urethral stricture   Miguel Miller returns today for routine follow-up. He is now 2 years out from completion of radiation therapy for prostate cancer. He has been on AndroGel but admittedly has been using a half dose due to the fact that his prescription has not been renewed just yet after attempts at insurance authorization. He has had some symptoms related to this including increased hot flashes and skin discomfort. He continues to take both tamsulosin 0.4 mg and Myrbetriq 25 mg for his BPH and lower urinary tract symptoms. IPSS is 10 which is relatively stable. He follows up today with a PVR. He has persisted in having dysuria which has now been going on for approximately 6 to 8 months. He did undergo cystoscopy back last spring that demonstrated a small urethral stricture although this was able to be navigated with the cystoscope. His symptoms have not responded to symptomatic therapy with Pyridium, Uribel, or anti-inflammatories. He feels that his stream is weaker and his dysuria is more bothersome. His pain only occurs during urination.   11/16/2022:  Patient presents for presurgical clearance for his upcoming cystoscopy with possible balloon dilation of ureteral stricture, if present. This is scheduled in 2 weeks on 11/30/2022. Since last seen, patient has not had any new medical  diagnoses, initiated any new medications, or undergone any procedures. He continues at baseline voiding function. He denies dysuria, gross hematuria, suprapubic discomfort, flank pain, fever/chills, nausea/vomiting. He does have questions with regards to upcoming procedure.     ALLERGIES: Hydrocodone-Acetaminophen CAPS Statins     MEDICATIONS: Androgel 20.25 mg/1.25 gram per actuation (1.62 %) gel in metered-dose pump 2 pump actuations each morning as instructed  Metformin Hcl 500 mg tablet  Myrbetriq 25 mg tablet, extended release 24 hr 1 tablet PO Daily  Phenazopyridine Hcl 200 mg tablet 1 tablet PO Q 8 H PRN  Tamsulosin Hcl 0.4 mg capsule 1 capsule PO Q HS  Trimix (40 mcg PGE, 30 mg PAP, 0.5 mg PHE) 5 mL vial (Please provide needles, alcohol swabs, etc.)  Acetaminophen 500 mg tablet 0 Oral  Cyclobenzaprine Hcl 5 mg tablet 1 tablet PO TID PRN  Diazepam 5 mg tablet 0 Oral  Dilaudid 2 mg tablet 0 Oral  Gabapentin 100 mg capsule  Melatonin TABS Oral  Uribel 118 mg-10 mg-40.8 mg-36 mg-0.12 mg capsule 1 capsule PO TID PRN  Zetia 10 MG Oral Tablet Oral     GU PSH: Complex Uroflow - 2017 Cystoscopy - 03/15/2022 Laser Surgery Prostate - 2018 Prostate Needle Biopsy - 2021 TRANSPERI NEEDLE PLACE, PROS - 2021 Transperineal Plmt Biodegradable Matrl 1/Mlt Njx - 2021 Vasectomy - 2011       PSH Notes: Repair Of Retinal Detachment   NON-GU PSH: Back Surgery (Unspecified) Cataract Surgery.. Hip Replacement, Left Neck Surgery (Unspecified) Revise Knee Joint - 2011 Shoulder Surgery (Unspecified) Surgical Pathology, Gross And Microscopic Examination For Prostate Needle - 2021     GU PMH: BPH  w/LUTS - 10/04/2022, - 03/15/2022, - 01/26/2022, - 09/14/2021, - 2022, - 2018, - 2017, Benign prostatic hyperplasia with urinary obstruction, - 2017 Bulbar urethral stricture - 10/04/2022 Dysuria - 10/04/2022, - 06/14/2022, - 03/15/2022, - 01/26/2022, - 09/14/2021 Primary hypogonadism - 10/04/2022, -  03/15/2022, - 09/14/2021, - 01/05/2021, - 2022, - 2017, Hypogonadism, testicular, - 2017 Prostate Cancer - 10/04/2022, - 03/15/2022, - 09/14/2021, - 01/05/2021, - 2022, - 08/03/2020, - 06/16/2020, - 06/08/2020, - 2021, After discussing the option he indicated that he would like to proceed with treatment with radioactive seeds. I will schedule him for an appointment with Miguel Miller, - 2021 Weak Urinary Stream - 10/04/2022 Pelvic/perineal pain - 03/15/2022, - 07/22/2021, - 07/06/2020, - 06/08/2020, - 05/20/2020, - 2021, - 2021 ED due to arterial insufficiency - 09/14/2021, - 01/05/2021, - 2018, - 2017, Erectile dysfunction due to arterial insufficiency, - 2017, Erectile dysfunction due to arterial insufficiency, - 2017 Urinary Frequency - 09/14/2021, (Stable), - 2022, Urinary frequency, - 2016 Elevated PSA - 2021, I noted the left lobe of his prostate seems a little bit larger than the right although no nodularity was palpable. His PSA has now increased to 8.38 and therefore I have recommended further evaluation with a prostate biopsy especially since he is currently on testosterone replacement. He will stop his daily aspirin until after the biopsy., - 2021 (Stable), He indicated that his most recent PSA was within the normal range off finasteride. I will obtain the results plan to see him back again in 6 months for re-evaluation., - 2019 (Stable), He has been getting his PSA done by Dr. Rex Miller. His PSA in 5/18 was 2.62 which, when corrected for the effects of finasteride would be 5.24. I will have his previous PSAs sent over in order to compare the stability of his PSA and continue to monitor this., - 2018 BPH w/o LUTS (Stable), He has a history of BPH with outlet obstruction which was treated with the laser and he is voiding with a good stream I told him he could stop taking finasteride. - 2018 Incomplete bladder emptying - 2017 Gross hematuria, Gross hematuria - 2016 Peyronies Disease, Fibrosis of corpus cavernosum or  penis - 2016      PMH Notes:   1) Prostate cancer: He was diagnosed by Miguel Miller with clinical stage T1c Nx Mx, Gleason 3+4=7 adenocarcinoma with a pretreatment PSA of 8.38. He elected treatment with a radiation seed implant that was performed on 03/29/20. It was subsequently determined that his seed implant was ectopic in its placement. He was monitored for complications and did have some perineal pain and a small perianal abscess that developed and was treated conservatively with antibiotics and local wound care. He proceeded with definitive curative therapy with IMRT from January through February 2022.   PSA nadir after treatment: 0.11   2) BPH with outlet obstruction: He underwent thallium laser vaporization of the prostate by Dr. Gaynelle Arabian on 10/02/16.   Current treatment: Myrbetriq 25 mg, Tamsulosin 0.4 mg   3) Erectile dysfunction/fatigue/decreased libido: He has been on testosterone replacement therapy for 15 years until his prostate cancer diagnosis. He stopped treatment in February 2021 and noted significant symptoms of testosterone deficiency including hot flashes off therapy.   NON-GU PMH: Anorectal abscess - 08/03/2020, - 07/22/2020, - 06/28/2020, - 06/22/2020, - 06/16/2020 Other specified postprocedural states - 2018 Anxiety, Anxiety - 2014 Diabetes Type 2 GERD Hypercholesterolemia Hypertension    Immunizations: None   FAMILY HISTORY: Cancer - Mother Diabetes - Mother [place]  Status Number - Runs In Family Father Deceased At Age31 ___ - Runs In Family Heart Disease - Grandfather Hypertension - Mother Mother Deceased At Age 59 from diabetic complicati - Runs In Family Prostate Cancer - Grandfather   SOCIAL HISTORY: Marital Status: Married Preferred Language: English; Ethnicity: Not Hispanic Or Latino; Race: White Current Smoking Status: Patient has never smoked.  Social Drinker.  Drinks 1 caffeinated drink per day.    REVIEW OF SYSTEMS:    GU Review  Male:   Patient denies frequent urination, hard to postpone urination, burning/ pain with urination, get up at night to urinate, leakage of urine, stream starts and stops, trouble starting your stream, have to strain to urinate , erection problems, and penile pain.  Gastrointestinal (Upper):   Patient denies nausea, vomiting, and indigestion/ heartburn.  Gastrointestinal (Lower):   Patient denies diarrhea and constipation.  Constitutional:   Patient denies fever, night sweats, weight loss, and fatigue.  Skin:   Patient denies skin rash/ lesion and itching.  Eyes:   Patient denies blurred vision and double vision.  Ears/ Nose/ Throat:   Patient denies sore throat and sinus problems.  Hematologic/Lymphatic:   Patient denies swollen glands and easy bruising.  Cardiovascular:   Patient denies leg swelling and chest pains.  Respiratory:   Patient denies cough and shortness of breath.  Endocrine:   Patient denies excessive thirst.  Musculoskeletal:   Patient denies back pain and joint pain.  Neurological:   Patient denies headaches and dizziness.  Psychologic:   Patient denies depression and anxiety.   VITAL SIGNS:      11/16/2022 01:53 PM  Weight 234 lb / 106.14 kg  Height 68 in / 172.72 cm  BP 123/75 mmHg  Heart Rate 81 /min  Temperature 97.8 F / 36.5 C  BMI 35.6 kg/m   GU PHYSICAL EXAMINATION:    Prostate: Smooth and flat.   MULTI-SYSTEM PHYSICAL EXAMINATION:    Constitutional: Well-nourished. No physical deformities. Normally developed. Good grooming.  Respiratory: No labored breathing, no use of accessory muscles. Lungs clear to auscultation bilaterally. No wheezing, rales, rhonchi.  Cardiovascular: Normal temperature, normal extremity pulses, no swelling. Regular rate and rhythm, no murmurs or gallops.  Skin: No paleness, no jaundice, no cyanosis.  Neurologic / Psychiatric: Oriented to time, oriented to place, oriented to person. No depression, no anxiety, no agitation.    Gastrointestinal: No mass, no suprapubic tenderness, no rigidity, non obese abdomen.      Complexity of Data:  Source Of History:  Patient, Medical Record Summary  Records Review:   Previous Doctor Records, Previous Patient Records  Urine Test Review:   Urinalysis   09/27/22 03/08/22 09/07/21 04/07/21 12/29/20 10/01/20 06/08/20 03/16/20  PSA  Total PSA 0.076 ng/mL 0.11 ng/mL 0.12 ng/mL 0.10 ng/mL 0.072 ng/mL 0.11 ng/mL 1.44 ng/mL 2.40 ng/mL    09/27/22 03/08/22 09/07/21 04/07/21 12/29/20 10/01/20 06/08/20 03/16/20  Hormones  Testosterone, Total 154.8 ng/dL 296.7 ng/dL 429.2 ng/dL 681.1 ng/dL 12.6 ng/dL 12.6 ng/dL 18.5 ng/dL 17.1 ng/dL    11/16/22  Urinalysis  Urine Appearance Clear   Urine Color Yellow   Urine Glucose Invalid mg/dL  Urine Bilirubin Neg mg/dL  Urine Ketones Neg mg/dL  Urine Specific Gravity 1.030   Urine Blood Trace ery/uL  Urine pH 6.0   Urine Protein Neg mg/dL  Urine Urobilinogen 0.2 mg/dL  Urine Nitrites Neg   Urine Leukocyte Esterase Neg leu/uL  Urine WBC/hpf 0 - 5/hpf   Urine RBC/hpf 0 - 2/hpf  Urine Epithelial Cells NS (Not Seen)   Urine Bacteria Few (10-25/hpf)   Urine Mucous Present   Urine Yeast NS (Not Seen)   Urine Trichomonas Not Present   Urine Cystals NS (Not Seen)   Urine Casts NS (Not Seen)   Urine Sperm Not Present    PROCEDURES:          Urinalysis w/Scope Dipstick Dipstick Cont'd Micro  Color: Yellow Bilirubin: Neg mg/dL WBC/hpf: 0 - 5/hpf  Appearance: Clear Ketones: Neg mg/dL RBC/hpf: 0 - 2/hpf  Specific Gravity: 1.030 Blood: Trace ery/uL Bacteria: Few (10-25/hpf)  pH: 6.0 Protein: Neg mg/dL Cystals: NS (Not Seen)  Glucose: Invalid mg/dL Urobilinogen: 0.2 mg/dL Casts: NS (Not Seen)    Nitrites: Neg Trichomonas: Not Present    Leukocyte Esterase: Neg leu/uL Mucous: Present      Epithelial Cells: NS (Not Seen)      Yeast: NS (Not Seen)      Sperm: Not Present    ASSESSMENT:      ICD-10 Details  1 GU:   Bulbar urethral  stricture - N35.011 Chronic, Stable  2   Dysuria - R30.0 Chronic, Stable   PLAN:           Orders Labs Urine Culture          Schedule Return Visit/Planned Activity: Keep Scheduled Appointment - Schedule Surgery          Document Letter(s):  Created for Patient: Clinical Summary         Notes:   Today, UA without infectious parameters. We will send for surgical clearance purposes, and follow-up with patient as appropriate based on C&S report. We did review procedure in detail, with associated risk and benefits, as well as prognosis, and anticipated recovery timeframe. We answered patient's questions to the best of our abilities.   Will have patient maintain his upcoming surgical appointment. Should he experience any changes in the interim, he knows to inform his urologist's scheduler. Patient voiced understanding and is amenable to this plan.        Next Appointment:      Next Appointment: 11/30/2022 10:15 AM    Appointment Type: Surgery     Location: [facility] [phone]    Provider: Raynelle Miller, M.D.    Reason for Visit: WL/OP CYSTO, POSSIBLE BALLOON DILATION OF URETHRAL STRICTURE      * Signed by Miguel Norris, PA-C on 11/18/22 at 4:33 PM (EDT)*

## 2022-11-30 ENCOUNTER — Encounter (HOSPITAL_COMMUNITY): Payer: Self-pay | Admitting: Urology

## 2022-11-30 ENCOUNTER — Ambulatory Visit (HOSPITAL_COMMUNITY): Payer: Medicare PPO

## 2022-11-30 ENCOUNTER — Ambulatory Visit (HOSPITAL_COMMUNITY): Payer: Medicare PPO | Admitting: Physician Assistant

## 2022-11-30 ENCOUNTER — Ambulatory Visit (HOSPITAL_BASED_OUTPATIENT_CLINIC_OR_DEPARTMENT_OTHER): Payer: Medicare PPO | Admitting: Certified Registered"

## 2022-11-30 ENCOUNTER — Encounter (HOSPITAL_COMMUNITY)
Admission: RE | Disposition: A | Discharge status: Home / Self Care | Payer: Self-pay | Source: Ambulatory Visit | Attending: Urology

## 2022-11-30 ENCOUNTER — Other Ambulatory Visit: Payer: Self-pay

## 2022-11-30 ENCOUNTER — Ambulatory Visit (HOSPITAL_COMMUNITY)
Admission: RE | Admit: 2022-11-30 | Discharge: 2022-11-30 | Disposition: A | Discharge status: Home / Self Care | Payer: Medicare PPO | Source: Ambulatory Visit | Attending: Urology | Admitting: Urology

## 2022-11-30 DIAGNOSIS — E669 Obesity, unspecified: Secondary | ICD-10-CM

## 2022-11-30 DIAGNOSIS — N401 Enlarged prostate with lower urinary tract symptoms: Secondary | ICD-10-CM | POA: Insufficient documentation

## 2022-11-30 DIAGNOSIS — Z923 Personal history of irradiation: Secondary | ICD-10-CM | POA: Insufficient documentation

## 2022-11-30 DIAGNOSIS — E119 Type 2 diabetes mellitus without complications: Secondary | ICD-10-CM | POA: Diagnosis not present

## 2022-11-30 DIAGNOSIS — N35912 Unspecified bulbous urethral stricture, male: Secondary | ICD-10-CM | POA: Insufficient documentation

## 2022-11-30 DIAGNOSIS — R300 Dysuria: Secondary | ICD-10-CM | POA: Diagnosis not present

## 2022-11-30 DIAGNOSIS — Z7984 Long term (current) use of oral hypoglycemic drugs: Secondary | ICD-10-CM | POA: Insufficient documentation

## 2022-11-30 DIAGNOSIS — Z7982 Long term (current) use of aspirin: Secondary | ICD-10-CM | POA: Insufficient documentation

## 2022-11-30 DIAGNOSIS — Z8546 Personal history of malignant neoplasm of prostate: Secondary | ICD-10-CM | POA: Insufficient documentation

## 2022-11-30 DIAGNOSIS — I2510 Atherosclerotic heart disease of native coronary artery without angina pectoris: Secondary | ICD-10-CM | POA: Insufficient documentation

## 2022-11-30 DIAGNOSIS — N35919 Unspecified urethral stricture, male, unspecified site: Secondary | ICD-10-CM | POA: Diagnosis not present

## 2022-11-30 DIAGNOSIS — K219 Gastro-esophageal reflux disease without esophagitis: Secondary | ICD-10-CM | POA: Diagnosis not present

## 2022-11-30 DIAGNOSIS — I10 Essential (primary) hypertension: Secondary | ICD-10-CM | POA: Diagnosis not present

## 2022-11-30 DIAGNOSIS — E291 Testicular hypofunction: Secondary | ICD-10-CM | POA: Diagnosis not present

## 2022-11-30 DIAGNOSIS — Z08 Encounter for follow-up examination after completed treatment for malignant neoplasm: Secondary | ICD-10-CM | POA: Insufficient documentation

## 2022-11-30 HISTORY — PX: CYSTOSCOPY WITH URETHRAL DILATATION: SHX5125

## 2022-11-30 LAB — GLUCOSE, CAPILLARY
Glucose-Capillary: 121 mg/dL — ABNORMAL HIGH (ref 70–99)
Glucose-Capillary: 124 mg/dL — ABNORMAL HIGH (ref 70–99)

## 2022-11-30 SURGERY — CYSTOSCOPY, WITH URETHRAL DILATION
Anesthesia: General | Site: Urethra

## 2022-11-30 MED ORDER — HYDROMORPHONE HCL 1 MG/ML IJ SOLN: 0.5000 mg | INTRAMUSCULAR | Status: DC | PRN

## 2022-11-30 MED ORDER — LIDOCAINE HCL (PF) 2 % IJ SOLN
INTRAMUSCULAR | Status: AC
  Filled 2022-11-30: qty 5

## 2022-11-30 MED ORDER — FENTANYL CITRATE PF 50 MCG/ML IJ SOSY
PREFILLED_SYRINGE | INTRAMUSCULAR | Status: AC
  Administered 2022-11-30: 50 ug via INTRAVENOUS
  Filled 2022-11-30: qty 3

## 2022-11-30 MED ORDER — FENTANYL CITRATE (PF) 100 MCG/2ML IJ SOLN
INTRAMUSCULAR | Status: DC | PRN
  Administered 2022-11-30: 50 ug via INTRAVENOUS

## 2022-11-30 MED ORDER — LACTATED RINGERS IV SOLN: INTRAVENOUS | Status: DC

## 2022-11-30 MED ORDER — ORAL CARE MOUTH RINSE: 15.0000 mL | Freq: Once | OROMUCOSAL | Status: AC

## 2022-11-30 MED ORDER — ONDANSETRON HCL 4 MG/2ML IJ SOLN: 4.0000 mg | Freq: Once | INTRAMUSCULAR | Status: DC | PRN

## 2022-11-30 MED ORDER — ONDANSETRON HCL 4 MG/2ML IJ SOLN
INTRAMUSCULAR | Status: AC
  Filled 2022-11-30: qty 2

## 2022-11-30 MED ORDER — 0.9 % SODIUM CHLORIDE (POUR BTL) OPTIME
TOPICAL | Status: DC | PRN
  Administered 2022-11-30: 1000 mL

## 2022-11-30 MED ORDER — DEXAMETHASONE SODIUM PHOSPHATE 10 MG/ML IJ SOLN
INTRAMUSCULAR | Status: DC | PRN
  Administered 2022-11-30: 4 mg via INTRAVENOUS

## 2022-11-30 MED ORDER — FENTANYL CITRATE PF 50 MCG/ML IJ SOSY
25.0000 ug | PREFILLED_SYRINGE | INTRAMUSCULAR | Status: DC | PRN
  Administered 2022-11-30 (×2): 50 ug via INTRAVENOUS

## 2022-11-30 MED ORDER — FENTANYL CITRATE (PF) 100 MCG/2ML IJ SOLN
INTRAMUSCULAR | Status: AC
  Filled 2022-11-30: qty 2

## 2022-11-30 MED ORDER — PROPOFOL 10 MG/ML IV BOLUS
INTRAVENOUS | Status: AC
  Filled 2022-11-30: qty 20

## 2022-11-30 MED ORDER — SODIUM CHLORIDE 0.9 % IR SOLN
Status: DC | PRN
  Administered 2022-11-30: 3000 mL

## 2022-11-30 MED ORDER — LIDOCAINE 2% (20 MG/ML) 5 ML SYRINGE
INTRAMUSCULAR | Status: DC | PRN
  Administered 2022-11-30: 40 mg via INTRAVENOUS

## 2022-11-30 MED ORDER — HYDROMORPHONE HCL 1 MG/ML IJ SOLN
INTRAMUSCULAR | Status: AC
  Administered 2022-11-30: 0.5 mg via INTRAVENOUS
  Filled 2022-11-30: qty 2

## 2022-11-30 MED ORDER — ACETAMINOPHEN 500 MG PO TABS
1000.0000 mg | ORAL_TABLET | Freq: Once | ORAL | Status: AC
  Administered 2022-11-30: 1000 mg via ORAL
  Filled 2022-11-30: qty 2

## 2022-11-30 MED ORDER — CHLORHEXIDINE GLUCONATE 0.12 % MT SOLN
15.0000 mL | Freq: Once | OROMUCOSAL | Status: AC
  Administered 2022-11-30: 15 mL via OROMUCOSAL

## 2022-11-30 MED ORDER — ONDANSETRON HCL 4 MG/2ML IJ SOLN
INTRAMUSCULAR | Status: DC | PRN
  Administered 2022-11-30: 4 mg via INTRAVENOUS

## 2022-11-30 MED ORDER — DEXAMETHASONE SODIUM PHOSPHATE 10 MG/ML IJ SOLN
INTRAMUSCULAR | Status: AC
  Filled 2022-11-30: qty 1

## 2022-11-30 MED ORDER — PROPOFOL 10 MG/ML IV BOLUS
INTRAVENOUS | Status: DC | PRN
  Administered 2022-11-30: 150 mg via INTRAVENOUS
  Administered 2022-11-30: 50 mg via INTRAVENOUS

## 2022-11-30 MED ORDER — CEFAZOLIN SODIUM-DEXTROSE 2-4 GM/100ML-% IV SOLN
2.0000 g | INTRAVENOUS | Status: AC
  Administered 2022-11-30: 2 g via INTRAVENOUS
  Filled 2022-11-30: qty 100

## 2022-11-30 SURGICAL SUPPLY — 22 items
BAG DRN RND TRDRP ANRFLXCHMBR (UROLOGICAL SUPPLIES) ×1
BAG URINE DRAIN 2000ML AR STRL (UROLOGICAL SUPPLIES) ×1 IMPLANT
BALLN NEPHROSTOMY (BALLOONS) ×1
BALLOON NEPHROSTOMY (BALLOONS) IMPLANT
CATH FOLEY 2W COUNCIL 20FR 5CC (CATHETERS) IMPLANT
CATH ROBINSON RED A/P 14FR (CATHETERS) IMPLANT
CATH URET 5FR 28IN CONE TIP (BALLOONS)
CATH URET 5FR 70CM CONE TIP (BALLOONS) IMPLANT
CATH URETL OPEN END 6FR 70 (CATHETERS) IMPLANT
CLOTH BEACON ORANGE TIMEOUT ST (SAFETY) ×1 IMPLANT
GLOVE BIO SURGEON STRL SZ7.5 (GLOVE) ×1 IMPLANT
GOWN STRL REUS W/ TWL XL LVL3 (GOWN DISPOSABLE) ×1 IMPLANT
GOWN STRL REUS W/TWL XL LVL3 (GOWN DISPOSABLE) ×1
GUIDEWIRE ANG ZIPWIRE 038X150 (WIRE) IMPLANT
GUIDEWIRE STR DUAL SENSOR (WIRE) IMPLANT
KIT TURNOVER KIT A (KITS) IMPLANT
MANIFOLD NEPTUNE II (INSTRUMENTS) IMPLANT
NS IRRIG 1000ML POUR BTL (IV SOLUTION) IMPLANT
PACK CYSTO (CUSTOM PROCEDURE TRAY) ×1 IMPLANT
PENCIL SMOKE EVACUATOR (MISCELLANEOUS) IMPLANT
TUBING CONNECTING 10 (TUBING) ×1 IMPLANT
WATER STERILE IRR 3000ML UROMA (IV SOLUTION) ×1 IMPLANT

## 2022-11-30 NOTE — Anesthesia Postprocedure Evaluation (Signed)
Anesthesia Post Note  Patient: Miguel Miller  Procedure(s) Performed: CYSTOSCOPY WITH POSSIBLE BALLOON DILATATION OF URETHRAL STRICTURE (Urethra)     Patient location during evaluation: PACU Anesthesia Type: General Level of consciousness: awake and alert Pain management: pain level controlled Vital Signs Assessment: post-procedure vital signs reviewed and stable Respiratory status: spontaneous breathing, nonlabored ventilation and respiratory function stable Cardiovascular status: stable and blood pressure returned to baseline Anesthetic complications: no   No notable events documented.  Last Vitals:  Vitals:   11/30/22 1227 11/30/22 1230  BP: (!) 167/67 (!) 167/67  Pulse: 65 61  Resp:    Temp: 36.7 C   SpO2: 100% 100%    Last Pain:  Vitals:   11/30/22 1227  TempSrc: Oral  PainSc:                  Audry Pili

## 2022-11-30 NOTE — Op Note (Signed)
Preoperative diagnosis: Dysuria/LUTS  Postoperative diagnosis: Dysuria/LUTS, bulbar urethral stricture  Procedures: Cystoscopy and balloon dilation of bulbar urethral stricture  Surgeon: Pryor Curia MD  Anesthesia: General  Complications: None  Specimens: None  Intraoperative findings: There was noted to be a less than 1 cm bulbar urethral stricture just distal to the membranous urethra.  Indication: Miguel Miller is a 82 year old gentleman with a history of prostate cancer status post radiation therapy.  He does have a history of a bulbar urethral stricture.  He has had ongoing lower urinary tract symptoms including dysuria and a weaker stream.  He did not respond to empiric therapy and it was recommended that he undergo further diagnostic evaluation with cystoscopy.  Based on prior significant discomfort with office cystoscopy, he did request that this be performed in the operating room under anesthesia.  We discussed the possibility that he may have a recurrent stricture and that we would plan to proceed with dilation if that were present.  The potential risks, complications, and the expected recovery process were discussed in detail.  Informed consent was obtained.  Description of procedure: The patient was taken the operating room and a general anesthetic was administered.  He was given preoperative antibiotics, placed in the dorsolithotomy position, and prepped and draped in usual sterile fashion.  Next, a preoperative timeout was performed.  Cystourethroscopy was then performed with a 22 French cystoscope sheath.  The anterior urethra appeared normal up to the bulbar urethra where, just distal to the verumontanum and membranous urethra, there was noted to be a short less than 1 cm stricture.  This did not appear to be particularly dense.  A 0.38 sensor guidewire was then advanced through the stricture and into the bladder under fluoroscopic guidance.  A 24 French Ultraxx balloon  dilator was then placed over the wire under fluoroscopic guidance and positioned appropriately.  It was then inflated to 18 atm of pressure and left inflated for 3 to 5 minutes.  The balloon was then deflated and removed.  Cystoscopy was again performed which revealed a widely patent bulbar urethra.  The prostatic urethra appeared normal aside from radiation changes.  The bladder was then systematically examined.  The ureteral orifice ease were in their expected anatomic location and were effluxing clear urine.  No bladder tumors, stones, or other mucosal pathology was identified.  The bladder was emptied and the procedure was ended.  He tolerated the procedure well and without complications.  He was able to be awakened and transferred to the recovery unit in satisfactory condition.

## 2022-11-30 NOTE — Anesthesia Procedure Notes (Signed)
Procedure Name: LMA Insertion Date/Time: 11/30/2022 10:47 AM  Performed by: Eben Burow, CRNAPre-anesthesia Checklist: Patient identified, Emergency Drugs available, Suction available, Patient being monitored and Timeout performed Patient Re-evaluated:Patient Re-evaluated prior to induction Oxygen Delivery Method: Circle system utilized Preoxygenation: Pre-oxygenation with 100% oxygen Induction Type: IV induction Ventilation: Mask ventilation without difficulty LMA: LMA inserted LMA Size: 5.0 Number of attempts: 3 Tube secured with: Tape Dental Injury: Teeth and Oropharynx as per pre-operative assessment  Comments: LMA #4  placed atraumatically by Jefm Miles, CRNA with inadequate TVs, Dr Fransisco Beau atraumatically placed #4 and then #5 LMA. #5 LMA seated better with adequate TVs and EtCO2.

## 2022-11-30 NOTE — Transfer of Care (Signed)
Immediate Anesthesia Transfer of Care Note  Patient: Miguel Miller  Procedure(s) Performed: CYSTOSCOPY WITH POSSIBLE BALLOON DILATATION OF URETHRAL STRICTURE (Urethra)  Patient Location: PACU  Anesthesia Type:General  Level of Consciousness: drowsy and patient cooperative  Airway & Oxygen Therapy: Patient Spontanous Breathing and Patient connected to face mask oxygen  Post-op Assessment: Report given to RN and Post -op Vital signs reviewed and stable  Post vital signs: Reviewed and stable  Last Vitals:  Vitals Value Taken Time  BP 173/87 11/30/22 1115  Temp    Pulse 62 11/30/22 1116  Resp 10 11/30/22 1116  SpO2 100 % 11/30/22 1116  Vitals shown include unvalidated device data.  Last Pain:  Vitals:   11/30/22 0834  TempSrc: Oral  PainSc:          Complications: No notable events documented.

## 2022-11-30 NOTE — Anesthesia Preprocedure Evaluation (Addendum)
Anesthesia Evaluation  Patient identified by MRN, date of birth, ID band Patient awake    Reviewed: Allergy & Precautions, NPO status , Patient's Chart, lab work & pertinent test results  Airway Mallampati: II  TM Distance: >3 FB Neck ROM: Full    Dental  (+) Dental Advisory Given   Pulmonary neg pulmonary ROS   Pulmonary exam normal        Cardiovascular hypertension, Pt. on medications + CAD  Normal cardiovascular exam   '17 TTE - EF 55%. Grade 1 diastolic dysfunction. Trivial AI      Neuro/Psych  Headaches PSYCHIATRIC DISORDERS Anxiety      Essential tremor     GI/Hepatic Neg liver ROS,GERD  Controlled and Medicated,,  Endo/Other  diabetes, Well Controlled, Type 2   Obesity   Renal/GU negative Renal ROS     Musculoskeletal  (+) Arthritis ,    Abdominal   Peds  Hematology  [id number]    Anesthesia Other Findings   Reproductive/Obstetrics                              Lab Results  Component Value Date   WBC 4.7 11/21/2022   HGB 14.5 11/21/2022   HCT 45.8 11/21/2022   MCV 94.2 11/21/2022   PLT 146 (L) 11/21/2022   Lab Results  Component Value Date   CREATININE 1.19 11/21/2022   BUN 27 (H) 11/21/2022   NA 137 11/21/2022   K 4.7 11/21/2022   CL 101 11/21/2022   CO2 26 11/21/2022    Anesthesia Physical Anesthesia Plan  ASA: 2  Anesthesia Plan: General   Post-op Pain Management: Tylenol PO (pre-op)*   Induction: Intravenous  PONV Risk Score and Plan: 2 and Ondansetron, Treatment may vary due to age or medical condition and Dexamethasone  Airway Management Planned: LMA  Additional Equipment: None  Intra-op Plan:   Post-operative Plan: Extubation in OR  Informed Consent: I have reviewed the patients History and Physical, chart, labs and discussed the procedure including the risks, benefits and alternatives for the proposed anesthesia with the patient or  authorized representative who has indicated his/her understanding and acceptance.     Dental advisory given  Plan Discussed with: CRNA and Anesthesiologist  Anesthesia Plan Comments:         Anesthesia Quick Evaluation

## 2022-11-30 NOTE — Interval H&P Note (Signed)
History and Physical Interval Note:  11/30/2022 9:57 AM  Miguel Miller  has presented today for surgery, with the diagnosis of URETHRAL STRICTURE.  The various methods of treatment have been discussed with the patient and family. After consideration of risks, benefits and other options for treatment, the patient has consented to  Procedure(s) with comments: CYSTOSCOPY WITH POSSIBLE BALLOON DILATATION OF URETHRAL STRICTURE (N/A) - 45 MINUTES NEEDED FOR CASE as a surgical intervention.  The patient's history has been reviewed, patient examined, no change in status, stable for surgery.  I have reviewed the patient's chart and labs.  Questions were answered to the patient's satisfaction.     [employer]

## 2022-11-30 NOTE — Discharge Instructions (Addendum)
You may see some blood in the urine and may have some burning with urination for 48-72 hours. You also may notice that you have to urinate more frequently or urgently after your procedure which is normal.  You should call should you develop an inability urinate, fever > 101, persistent nausea and vomiting that prevents you from eating or drinking to stay hydrated.    

## 2022-12-01 ENCOUNTER — Encounter (HOSPITAL_COMMUNITY): Payer: Self-pay | Admitting: Urology

## 2022-12-08 ENCOUNTER — Ambulatory Visit: Payer: Medicare PPO | Admitting: Internal Medicine

## 2022-12-08 DIAGNOSIS — G4700 Insomnia, unspecified: Secondary | ICD-10-CM | POA: Diagnosis not present

## 2022-12-08 DIAGNOSIS — F4321 Adjustment disorder with depressed mood: Secondary | ICD-10-CM | POA: Diagnosis not present

## 2022-12-08 DIAGNOSIS — Z6834 Body mass index (BMI) 34.0-34.9, adult: Secondary | ICD-10-CM | POA: Diagnosis not present

## 2022-12-08 DIAGNOSIS — F4322 Adjustment disorder with anxiety: Secondary | ICD-10-CM | POA: Diagnosis not present

## 2022-12-25 DIAGNOSIS — Z6834 Body mass index (BMI) 34.0-34.9, adult: Secondary | ICD-10-CM | POA: Diagnosis not present

## 2022-12-25 DIAGNOSIS — I951 Orthostatic hypotension: Secondary | ICD-10-CM | POA: Diagnosis not present

## 2022-12-25 DIAGNOSIS — R42 Dizziness and giddiness: Secondary | ICD-10-CM | POA: Diagnosis not present

## 2022-12-25 DIAGNOSIS — F4322 Adjustment disorder with anxiety: Secondary | ICD-10-CM | POA: Diagnosis not present

## 2022-12-26 DIAGNOSIS — C61 Malignant neoplasm of prostate: Secondary | ICD-10-CM | POA: Diagnosis not present

## 2022-12-26 DIAGNOSIS — R300 Dysuria: Secondary | ICD-10-CM | POA: Diagnosis not present

## 2022-12-26 DIAGNOSIS — N35011 Post-traumatic bulbous urethral stricture: Secondary | ICD-10-CM | POA: Diagnosis not present

## 2023-01-24 DIAGNOSIS — F4322 Adjustment disorder with anxiety: Secondary | ICD-10-CM | POA: Diagnosis not present

## 2023-01-24 DIAGNOSIS — R42 Dizziness and giddiness: Secondary | ICD-10-CM | POA: Diagnosis not present

## 2023-01-24 DIAGNOSIS — G4700 Insomnia, unspecified: Secondary | ICD-10-CM | POA: Diagnosis not present

## 2023-01-24 DIAGNOSIS — Z6833 Body mass index (BMI) 33.0-33.9, adult: Secondary | ICD-10-CM | POA: Diagnosis not present

## 2023-01-24 DIAGNOSIS — Z0000 Encounter for general adult medical examination without abnormal findings: Secondary | ICD-10-CM | POA: Diagnosis not present

## 2023-01-30 DIAGNOSIS — D225 Melanocytic nevi of trunk: Secondary | ICD-10-CM | POA: Diagnosis not present

## 2023-01-30 DIAGNOSIS — L570 Actinic keratosis: Secondary | ICD-10-CM | POA: Diagnosis not present

## 2023-01-30 DIAGNOSIS — L814 Other melanin hyperpigmentation: Secondary | ICD-10-CM | POA: Diagnosis not present

## 2023-01-30 DIAGNOSIS — L218 Other seborrheic dermatitis: Secondary | ICD-10-CM | POA: Diagnosis not present

## 2023-01-30 DIAGNOSIS — L821 Other seborrheic keratosis: Secondary | ICD-10-CM | POA: Diagnosis not present

## 2023-02-14 DIAGNOSIS — R2681 Unsteadiness on feet: Secondary | ICD-10-CM | POA: Diagnosis not present

## 2023-02-14 DIAGNOSIS — G250 Essential tremor: Secondary | ICD-10-CM | POA: Diagnosis not present

## 2023-02-14 DIAGNOSIS — F4322 Adjustment disorder with anxiety: Secondary | ICD-10-CM | POA: Diagnosis not present

## 2023-02-14 DIAGNOSIS — Z6833 Body mass index (BMI) 33.0-33.9, adult: Secondary | ICD-10-CM | POA: Diagnosis not present

## 2023-03-28 DIAGNOSIS — C61 Malignant neoplasm of prostate: Secondary | ICD-10-CM | POA: Diagnosis not present

## 2023-04-04 DIAGNOSIS — C61 Malignant neoplasm of prostate: Secondary | ICD-10-CM | POA: Diagnosis not present

## 2023-04-04 DIAGNOSIS — N401 Enlarged prostate with lower urinary tract symptoms: Secondary | ICD-10-CM | POA: Diagnosis not present

## 2023-04-04 DIAGNOSIS — R300 Dysuria: Secondary | ICD-10-CM | POA: Diagnosis not present

## 2023-04-04 DIAGNOSIS — E291 Testicular hypofunction: Secondary | ICD-10-CM | POA: Diagnosis not present

## 2023-04-10 DIAGNOSIS — R262 Difficulty in walking, not elsewhere classified: Secondary | ICD-10-CM | POA: Diagnosis not present

## 2023-04-12 DIAGNOSIS — R262 Difficulty in walking, not elsewhere classified: Secondary | ICD-10-CM | POA: Diagnosis not present

## 2023-04-16 DIAGNOSIS — R262 Difficulty in walking, not elsewhere classified: Secondary | ICD-10-CM | POA: Diagnosis not present

## 2023-04-18 DIAGNOSIS — Z6832 Body mass index (BMI) 32.0-32.9, adult: Secondary | ICD-10-CM | POA: Diagnosis not present

## 2023-04-18 DIAGNOSIS — E119 Type 2 diabetes mellitus without complications: Secondary | ICD-10-CM | POA: Diagnosis not present

## 2023-04-18 DIAGNOSIS — I700 Atherosclerosis of aorta: Secondary | ICD-10-CM | POA: Diagnosis not present

## 2023-04-18 DIAGNOSIS — D696 Thrombocytopenia, unspecified: Secondary | ICD-10-CM | POA: Diagnosis not present

## 2023-04-18 DIAGNOSIS — G250 Essential tremor: Secondary | ICD-10-CM | POA: Diagnosis not present

## 2023-04-18 DIAGNOSIS — I10 Essential (primary) hypertension: Secondary | ICD-10-CM | POA: Diagnosis not present

## 2023-04-18 DIAGNOSIS — G4700 Insomnia, unspecified: Secondary | ICD-10-CM | POA: Diagnosis not present

## 2023-04-18 DIAGNOSIS — E785 Hyperlipidemia, unspecified: Secondary | ICD-10-CM | POA: Diagnosis not present

## 2023-04-19 DIAGNOSIS — R262 Difficulty in walking, not elsewhere classified: Secondary | ICD-10-CM | POA: Diagnosis not present

## 2023-05-01 DIAGNOSIS — R262 Difficulty in walking, not elsewhere classified: Secondary | ICD-10-CM | POA: Diagnosis not present

## 2023-05-03 DIAGNOSIS — R262 Difficulty in walking, not elsewhere classified: Secondary | ICD-10-CM | POA: Diagnosis not present

## 2023-05-08 DIAGNOSIS — R262 Difficulty in walking, not elsewhere classified: Secondary | ICD-10-CM | POA: Diagnosis not present

## 2023-05-10 DIAGNOSIS — R262 Difficulty in walking, not elsewhere classified: Secondary | ICD-10-CM | POA: Diagnosis not present

## 2023-05-15 DIAGNOSIS — R262 Difficulty in walking, not elsewhere classified: Secondary | ICD-10-CM | POA: Diagnosis not present

## 2023-05-17 DIAGNOSIS — R262 Difficulty in walking, not elsewhere classified: Secondary | ICD-10-CM | POA: Diagnosis not present

## 2023-05-22 DIAGNOSIS — R262 Difficulty in walking, not elsewhere classified: Secondary | ICD-10-CM | POA: Diagnosis not present

## 2023-05-23 DIAGNOSIS — F4322 Adjustment disorder with anxiety: Secondary | ICD-10-CM | POA: Diagnosis not present

## 2023-05-23 DIAGNOSIS — G4700 Insomnia, unspecified: Secondary | ICD-10-CM | POA: Diagnosis not present

## 2023-05-23 DIAGNOSIS — F4320 Adjustment disorder, unspecified: Secondary | ICD-10-CM | POA: Diagnosis not present

## 2023-05-23 DIAGNOSIS — G250 Essential tremor: Secondary | ICD-10-CM | POA: Diagnosis not present

## 2023-05-29 DIAGNOSIS — R262 Difficulty in walking, not elsewhere classified: Secondary | ICD-10-CM | POA: Diagnosis not present

## 2023-05-31 DIAGNOSIS — R262 Difficulty in walking, not elsewhere classified: Secondary | ICD-10-CM | POA: Diagnosis not present

## 2023-06-04 DIAGNOSIS — L570 Actinic keratosis: Secondary | ICD-10-CM | POA: Diagnosis not present

## 2023-06-04 DIAGNOSIS — D2239 Melanocytic nevi of other parts of face: Secondary | ICD-10-CM | POA: Diagnosis not present

## 2023-06-04 DIAGNOSIS — L821 Other seborrheic keratosis: Secondary | ICD-10-CM | POA: Diagnosis not present

## 2023-06-04 DIAGNOSIS — L218 Other seborrheic dermatitis: Secondary | ICD-10-CM | POA: Diagnosis not present

## 2023-06-04 DIAGNOSIS — L814 Other melanin hyperpigmentation: Secondary | ICD-10-CM | POA: Diagnosis not present

## 2023-06-05 DIAGNOSIS — R262 Difficulty in walking, not elsewhere classified: Secondary | ICD-10-CM | POA: Diagnosis not present

## 2023-06-07 DIAGNOSIS — R262 Difficulty in walking, not elsewhere classified: Secondary | ICD-10-CM | POA: Diagnosis not present

## 2023-06-12 DIAGNOSIS — R262 Difficulty in walking, not elsewhere classified: Secondary | ICD-10-CM | POA: Diagnosis not present

## 2023-06-14 DIAGNOSIS — R262 Difficulty in walking, not elsewhere classified: Secondary | ICD-10-CM | POA: Diagnosis not present

## 2023-06-19 DIAGNOSIS — R262 Difficulty in walking, not elsewhere classified: Secondary | ICD-10-CM | POA: Diagnosis not present

## 2023-06-21 DIAGNOSIS — R262 Difficulty in walking, not elsewhere classified: Secondary | ICD-10-CM | POA: Diagnosis not present

## 2023-06-26 DIAGNOSIS — R262 Difficulty in walking, not elsewhere classified: Secondary | ICD-10-CM | POA: Diagnosis not present

## 2023-06-28 DIAGNOSIS — F4322 Adjustment disorder with anxiety: Secondary | ICD-10-CM | POA: Diagnosis not present

## 2023-06-28 DIAGNOSIS — R262 Difficulty in walking, not elsewhere classified: Secondary | ICD-10-CM | POA: Diagnosis not present

## 2023-06-28 DIAGNOSIS — G4700 Insomnia, unspecified: Secondary | ICD-10-CM | POA: Diagnosis not present

## 2023-06-28 DIAGNOSIS — Z9181 History of falling: Secondary | ICD-10-CM | POA: Diagnosis not present

## 2023-06-28 DIAGNOSIS — G250 Essential tremor: Secondary | ICD-10-CM | POA: Diagnosis not present

## 2023-07-03 DIAGNOSIS — R262 Difficulty in walking, not elsewhere classified: Secondary | ICD-10-CM | POA: Diagnosis not present

## 2023-07-05 DIAGNOSIS — R262 Difficulty in walking, not elsewhere classified: Secondary | ICD-10-CM | POA: Diagnosis not present

## 2023-07-10 DIAGNOSIS — R262 Difficulty in walking, not elsewhere classified: Secondary | ICD-10-CM | POA: Diagnosis not present

## 2023-07-17 DIAGNOSIS — R262 Difficulty in walking, not elsewhere classified: Secondary | ICD-10-CM | POA: Diagnosis not present

## 2023-10-12 DIAGNOSIS — C61 Malignant neoplasm of prostate: Secondary | ICD-10-CM | POA: Diagnosis not present

## 2023-10-16 DIAGNOSIS — I2510 Atherosclerotic heart disease of native coronary artery without angina pectoris: Secondary | ICD-10-CM | POA: Diagnosis not present

## 2023-10-16 DIAGNOSIS — E785 Hyperlipidemia, unspecified: Secondary | ICD-10-CM | POA: Diagnosis not present

## 2023-10-16 DIAGNOSIS — G4700 Insomnia, unspecified: Secondary | ICD-10-CM | POA: Diagnosis not present

## 2023-10-16 DIAGNOSIS — E1121 Type 2 diabetes mellitus with diabetic nephropathy: Secondary | ICD-10-CM | POA: Diagnosis not present

## 2023-10-16 DIAGNOSIS — I700 Atherosclerosis of aorta: Secondary | ICD-10-CM | POA: Diagnosis not present

## 2023-10-16 DIAGNOSIS — I10 Essential (primary) hypertension: Secondary | ICD-10-CM | POA: Diagnosis not present

## 2023-10-16 DIAGNOSIS — R809 Proteinuria, unspecified: Secondary | ICD-10-CM | POA: Diagnosis not present

## 2023-10-16 DIAGNOSIS — E042 Nontoxic multinodular goiter: Secondary | ICD-10-CM | POA: Diagnosis not present

## 2023-10-16 DIAGNOSIS — Z0000 Encounter for general adult medical examination without abnormal findings: Secondary | ICD-10-CM | POA: Diagnosis not present

## 2023-10-16 DIAGNOSIS — K219 Gastro-esophageal reflux disease without esophagitis: Secondary | ICD-10-CM | POA: Diagnosis not present

## 2023-10-16 DIAGNOSIS — D696 Thrombocytopenia, unspecified: Secondary | ICD-10-CM | POA: Diagnosis not present

## 2023-10-19 DIAGNOSIS — N35011 Post-traumatic bulbous urethral stricture: Secondary | ICD-10-CM | POA: Diagnosis not present

## 2023-10-19 DIAGNOSIS — C61 Malignant neoplasm of prostate: Secondary | ICD-10-CM | POA: Diagnosis not present

## 2023-10-19 DIAGNOSIS — E291 Testicular hypofunction: Secondary | ICD-10-CM | POA: Diagnosis not present

## 2023-10-19 DIAGNOSIS — R300 Dysuria: Secondary | ICD-10-CM | POA: Diagnosis not present

## 2023-10-19 DIAGNOSIS — N401 Enlarged prostate with lower urinary tract symptoms: Secondary | ICD-10-CM | POA: Diagnosis not present

## 2023-10-19 DIAGNOSIS — R3912 Poor urinary stream: Secondary | ICD-10-CM | POA: Diagnosis not present

## 2023-11-19 DIAGNOSIS — R296 Repeated falls: Secondary | ICD-10-CM | POA: Diagnosis not present

## 2023-11-22 DIAGNOSIS — R296 Repeated falls: Secondary | ICD-10-CM | POA: Diagnosis not present

## 2023-11-27 DIAGNOSIS — M25551 Pain in right hip: Secondary | ICD-10-CM | POA: Diagnosis not present

## 2023-11-27 DIAGNOSIS — R296 Repeated falls: Secondary | ICD-10-CM | POA: Diagnosis not present

## 2023-11-29 DIAGNOSIS — R296 Repeated falls: Secondary | ICD-10-CM | POA: Diagnosis not present

## 2023-12-04 DIAGNOSIS — R296 Repeated falls: Secondary | ICD-10-CM | POA: Diagnosis not present

## 2023-12-06 DIAGNOSIS — R296 Repeated falls: Secondary | ICD-10-CM | POA: Diagnosis not present

## 2023-12-07 DIAGNOSIS — Z6833 Body mass index (BMI) 33.0-33.9, adult: Secondary | ICD-10-CM | POA: Diagnosis not present

## 2023-12-07 DIAGNOSIS — T148XXA Other injury of unspecified body region, initial encounter: Secondary | ICD-10-CM | POA: Diagnosis not present

## 2023-12-07 DIAGNOSIS — S301XXA Contusion of abdominal wall, initial encounter: Secondary | ICD-10-CM | POA: Diagnosis not present

## 2023-12-07 DIAGNOSIS — W19XXXA Unspecified fall, initial encounter: Secondary | ICD-10-CM | POA: Diagnosis not present

## 2023-12-11 DIAGNOSIS — R296 Repeated falls: Secondary | ICD-10-CM | POA: Diagnosis not present

## 2023-12-13 DIAGNOSIS — R296 Repeated falls: Secondary | ICD-10-CM | POA: Diagnosis not present

## 2023-12-20 DIAGNOSIS — R296 Repeated falls: Secondary | ICD-10-CM | POA: Diagnosis not present

## 2023-12-25 DIAGNOSIS — R296 Repeated falls: Secondary | ICD-10-CM | POA: Diagnosis not present

## 2024-01-01 DIAGNOSIS — R296 Repeated falls: Secondary | ICD-10-CM | POA: Diagnosis not present

## 2024-01-03 DIAGNOSIS — R296 Repeated falls: Secondary | ICD-10-CM | POA: Diagnosis not present

## 2024-01-17 DIAGNOSIS — R296 Repeated falls: Secondary | ICD-10-CM | POA: Diagnosis not present

## 2024-02-21 DIAGNOSIS — R296 Repeated falls: Secondary | ICD-10-CM | POA: Diagnosis not present

## 2024-02-25 DIAGNOSIS — R296 Repeated falls: Secondary | ICD-10-CM | POA: Diagnosis not present

## 2024-02-26 DIAGNOSIS — M25551 Pain in right hip: Secondary | ICD-10-CM | POA: Diagnosis not present

## 2024-02-26 DIAGNOSIS — Z6834 Body mass index (BMI) 34.0-34.9, adult: Secondary | ICD-10-CM | POA: Diagnosis not present

## 2024-02-26 DIAGNOSIS — G4700 Insomnia, unspecified: Secondary | ICD-10-CM | POA: Diagnosis not present

## 2024-02-26 DIAGNOSIS — R030 Elevated blood-pressure reading, without diagnosis of hypertension: Secondary | ICD-10-CM | POA: Diagnosis not present

## 2024-02-27 ENCOUNTER — Other Ambulatory Visit: Payer: Self-pay

## 2024-02-27 ENCOUNTER — Emergency Department (HOSPITAL_COMMUNITY)
Admission: EM | Admit: 2024-02-27 | Discharge: 2024-02-27 | Disposition: A | Discharge status: Home / Self Care | Attending: Emergency Medicine | Admitting: Emergency Medicine

## 2024-02-27 ENCOUNTER — Encounter (HOSPITAL_COMMUNITY): Payer: Self-pay

## 2024-02-27 ENCOUNTER — Emergency Department (HOSPITAL_COMMUNITY)

## 2024-02-27 DIAGNOSIS — S0511XA Contusion of eyeball and orbital tissues, right eye, initial encounter: Secondary | ICD-10-CM | POA: Diagnosis not present

## 2024-02-27 DIAGNOSIS — R519 Headache, unspecified: Secondary | ICD-10-CM | POA: Insufficient documentation

## 2024-02-27 DIAGNOSIS — R220 Localized swelling, mass and lump, head: Secondary | ICD-10-CM | POA: Diagnosis not present

## 2024-02-27 DIAGNOSIS — R2689 Other abnormalities of gait and mobility: Secondary | ICD-10-CM | POA: Insufficient documentation

## 2024-02-27 DIAGNOSIS — S0990XA Unspecified injury of head, initial encounter: Secondary | ICD-10-CM | POA: Diagnosis not present

## 2024-02-27 DIAGNOSIS — S0011XA Contusion of right eyelid and periocular area, initial encounter: Secondary | ICD-10-CM | POA: Diagnosis not present

## 2024-02-27 DIAGNOSIS — R61 Generalized hyperhidrosis: Secondary | ICD-10-CM | POA: Diagnosis not present

## 2024-02-27 DIAGNOSIS — H05231 Hemorrhage of right orbit: Secondary | ICD-10-CM

## 2024-02-27 DIAGNOSIS — R42 Dizziness and giddiness: Secondary | ICD-10-CM | POA: Diagnosis not present

## 2024-02-27 DIAGNOSIS — Z96642 Presence of left artificial hip joint: Secondary | ICD-10-CM | POA: Diagnosis not present

## 2024-02-27 DIAGNOSIS — M542 Cervicalgia: Secondary | ICD-10-CM | POA: Diagnosis not present

## 2024-02-27 DIAGNOSIS — W01198A Fall on same level from slipping, tripping and stumbling with subsequent striking against other object, initial encounter: Secondary | ICD-10-CM | POA: Insufficient documentation

## 2024-02-27 DIAGNOSIS — W19XXXA Unspecified fall, initial encounter: Secondary | ICD-10-CM

## 2024-02-27 DIAGNOSIS — G44309 Post-traumatic headache, unspecified, not intractable: Secondary | ICD-10-CM | POA: Diagnosis not present

## 2024-02-27 DIAGNOSIS — I700 Atherosclerosis of aorta: Secondary | ICD-10-CM | POA: Diagnosis not present

## 2024-02-27 DIAGNOSIS — S0591XA Unspecified injury of right eye and orbit, initial encounter: Secondary | ICD-10-CM | POA: Diagnosis present

## 2024-02-27 DIAGNOSIS — Z043 Encounter for examination and observation following other accident: Secondary | ICD-10-CM | POA: Diagnosis not present

## 2024-02-27 MED ORDER — BACITRACIN ZINC 500 UNIT/GM EX OINT
TOPICAL_OINTMENT | Freq: Two times a day (BID) | CUTANEOUS | Status: DC
  Administered 2024-02-27: 1 via TOPICAL
  Filled 2024-02-27: qty 0.9

## 2024-02-27 MED ORDER — ACETAMINOPHEN 500 MG PO TABS
1000.0000 mg | ORAL_TABLET | Freq: Once | ORAL | Status: AC
  Administered 2024-02-27: 1000 mg via ORAL
  Filled 2024-02-27: qty 2

## 2024-02-27 NOTE — ED Provider Notes (Signed)
 [facility] Emergency Department Provider Note MRN:  [id number]  Arrival date & time: 02/27/24     Chief Complaint   Fall   History of Present Illness   Miguel Miller is a 83 y.o. year-old male presents to the ED with chief complaint of fall.  States that he was bringing in Building surveyor and tripped on a step.  [place] and hit his head on the sidewalk.  States that the fall happened this evening.  States that he has been having troubles with his balance for the past year or so.  Denies taking any blood thinners.  Reports moderate headache.  Denies any pain in his arms and legs.  History provided by patient.   Review of Systems  Pertinent positive and negative review of systems noted in HPI.    Physical Exam   Vitals:   02/27/24 2158  BP: (!) 151/63  Pulse: 72  Resp: (!) 23  Temp: 98.1 F (36.7 C)  SpO2: 100%    CONSTITUTIONAL:  non toxic-appearing, NAD NEURO:  Alert and oriented x 3, CN 3-12 grossly intact EYES:  eyes equal and reactive ENT/NECK:  Supple, no stridor, right sided periorbital hematoma with tenderness and swelling above the right eyebrow  CARDIO:  normal rate, regular rhythm, appears well-perfused  PULM:  No respiratory distress, CTAB GI/GU:  non-distended, no focal tenderness MSK/SPINE:  No gross deformities, no edema, moves all extremities  SKIN:  no rash, atraumatic   *Additional and/or pertinent findings included in MDM below  Diagnostic and Interventional Summary    EKG Interpretation Date/Time:    Ventricular Rate:    PR Interval:    QRS Duration:    QT Interval:    QTC Calculation:   R Axis:      Text Interpretation:         Labs Reviewed - No data to display  CT HEAD WO CONTRAST ( )  Final Result    CT Maxillofacial Wo Contrast  Final Result    CT Cervical Spine Wo Contrast  Final Result    DG Chest Port 1 View  Final Result    DG Pelvis Portable  Final Result      Medications   bacitracin  ointment (has no administration in time range)  acetaminophen  (TYLENOL ) tablet 1,000 mg (1,000 mg Oral Given 02/27/24 2257)     Procedures  /  Critical Care Procedures  ED Course and Medical Decision Making  I have reviewed the triage vital signs, the nursing notes, and pertinent available records from the EMR.  Social Determinants Affecting Complexity of Care: Patient has no clinically significant social determinants affecting this chief complaint..   ED Course:    Medical Decision Making Patient here after a fall from home.  He tripped on the sidewalk and hit his head on the ground.  He did not lose consciousness.  He is not anticoagulated.  He sustained an injury to his right forehead and has right periorbital hematoma.  States that he only has pain where they moved him onto the stretcher and pulled his chest hairs.  He is treated with Tylenol  in the ED.  Abrasions treated with bacitracin .  No lacerations requiring primary repair.  Recommend ice for periorbital hematoma.  CTs and imaging are reassuring.  C-spine cleared by me.  Will discharge home with PCP follow-up.  Patient understands and agrees with the plan.  Amount and/or Complexity of Data Reviewed Radiology: ordered.  Risk OTC drugs.  Consultants: No consultations were needed in caring for this patient.   Treatment and Plan: Emergency department workup does not suggest an emergent condition requiring admission or immediate intervention beyond  what has been performed at this time. The patient is safe for discharge and has  been instructed to return immediately for worsening symptoms, change in  symptoms or any other concerns    Final Clinical Impressions(s) / ED Diagnoses     ICD-10-CM   1. Fall, initial encounter  W19.XXXA     2. Periorbital hematoma of right eye  H05.231       ED Discharge Orders     None         Discharge Instructions Discussed with and Provided to  Patient:   Discharge Instructions   None      Vicky Charleston, PA-C 02/27/24 2338    Elnor Jayson LABOR, DO 03/01/24 [phone]

## 2024-02-27 NOTE — ED Triage Notes (Signed)
 Pt BIB [facility] from home. Pt had an unwitnessed mechanical fall. Was carrying something inside and fell and hit his head on the sidewalk. Hematoma noted to right eye and an abrasion noted above right eye. PEERLA+3. EMS notes bilateral abrasions to knees and elbows. No obvious deformities.  Pt in c-collar upon arrival. A&Ox4. Pt was lying on the pavement for about an hour before EMS arrived.   EMS 146SBP 98% RA 80P 140CBG

## 2024-02-27 NOTE — ED Notes (Signed)
 Patient walked in hallway, he was steady on his feet.

## 2024-03-11 DIAGNOSIS — R296 Repeated falls: Secondary | ICD-10-CM | POA: Diagnosis not present

## 2024-03-13 DIAGNOSIS — R296 Repeated falls: Secondary | ICD-10-CM | POA: Diagnosis not present

## 2024-03-18 DIAGNOSIS — R296 Repeated falls: Secondary | ICD-10-CM | POA: Diagnosis not present

## 2024-03-19 DIAGNOSIS — I469 Cardiac arrest, cause unspecified: Secondary | ICD-10-CM | POA: Diagnosis not present

## 2024-03-19 DIAGNOSIS — I4901 Ventricular fibrillation: Secondary | ICD-10-CM | POA: Diagnosis not present

## 2024-03-19 DIAGNOSIS — R739 Hyperglycemia, unspecified: Secondary | ICD-10-CM | POA: Diagnosis not present

## 2024-03-19 DIAGNOSIS — I499 Cardiac arrhythmia, unspecified: Secondary | ICD-10-CM | POA: Diagnosis not present

## 2024-04-04 DIAGNOSIS — 419620001 Death: Secondary | SNOMED CT | POA: Diagnosis not present

## 2024-04-04 DEATH — deceased
